# Patient Record
Sex: Female | Born: 1955 | Race: Black or African American | Hispanic: No | Marital: Married | Smoking: Never smoker
Health system: Southern US, Community
[De-identification: ages and names within clinical notes are randomized; demographics above are authoritative.]

## PROBLEM LIST (undated history)

## (undated) DIAGNOSIS — Z923 Personal history of irradiation: Secondary | ICD-10-CM

## (undated) DIAGNOSIS — N319 Neuromuscular dysfunction of bladder, unspecified: Secondary | ICD-10-CM

## (undated) DIAGNOSIS — G35 Multiple sclerosis: Secondary | ICD-10-CM

## (undated) DIAGNOSIS — N390 Urinary tract infection, site not specified: Secondary | ICD-10-CM

## (undated) DIAGNOSIS — G2581 Restless legs syndrome: Secondary | ICD-10-CM

## (undated) DIAGNOSIS — C50919 Malignant neoplasm of unspecified site of unspecified female breast: Secondary | ICD-10-CM

## (undated) DIAGNOSIS — H469 Unspecified optic neuritis: Secondary | ICD-10-CM

## (undated) HISTORY — PX: FOOT SURGERY: SHX648

## (undated) HISTORY — DX: Multiple sclerosis: G35

## (undated) HISTORY — DX: Neuromuscular dysfunction of bladder, unspecified: N31.9

## (undated) HISTORY — DX: Unspecified optic neuritis: H46.9

## (undated) HISTORY — DX: Personal history of irradiation: Z92.3

## (undated) HISTORY — PX: HAND SURGERY: SHX662

## (undated) HISTORY — DX: Urinary tract infection, site not specified: N39.0

## (undated) HISTORY — PX: BREAST LUMPECTOMY: SHX2

## (undated) HISTORY — DX: Malignant neoplasm of unspecified site of unspecified female breast: C50.919

## (undated) HISTORY — DX: Restless legs syndrome: G25.81

---

## 1998-07-15 ENCOUNTER — Ambulatory Visit: Admission: RE | Admit: 1998-07-15 | Discharge: 1998-07-15 | Payer: Self-pay | Admitting: Obstetrics and Gynecology

## 1998-09-30 ENCOUNTER — Other Ambulatory Visit: Admission: RE | Admit: 1998-09-30 | Discharge: 1998-09-30 | Payer: Self-pay | Admitting: Obstetrics and Gynecology

## 1999-08-11 ENCOUNTER — Ambulatory Visit (HOSPITAL_COMMUNITY): Admission: RE | Admit: 1999-08-11 | Discharge: 1999-08-11 | Payer: Self-pay | Admitting: Obstetrics and Gynecology

## 1999-08-11 ENCOUNTER — Encounter: Payer: Self-pay | Admitting: Obstetrics and Gynecology

## 1999-09-27 ENCOUNTER — Other Ambulatory Visit: Admission: RE | Admit: 1999-09-27 | Discharge: 1999-09-27 | Payer: Self-pay | Admitting: *Deleted

## 2000-04-26 ENCOUNTER — Other Ambulatory Visit: Admission: RE | Admit: 2000-04-26 | Discharge: 2000-04-26 | Payer: Self-pay | Admitting: *Deleted

## 2000-04-26 ENCOUNTER — Encounter (INDEPENDENT_AMBULATORY_CARE_PROVIDER_SITE_OTHER): Payer: Self-pay | Admitting: Specialist

## 2000-08-16 ENCOUNTER — Encounter: Payer: Self-pay | Admitting: *Deleted

## 2000-08-16 ENCOUNTER — Ambulatory Visit (HOSPITAL_COMMUNITY): Admission: RE | Admit: 2000-08-16 | Discharge: 2000-08-16 | Payer: Self-pay | Admitting: *Deleted

## 2000-10-11 ENCOUNTER — Other Ambulatory Visit: Admission: RE | Admit: 2000-10-11 | Discharge: 2000-10-11 | Payer: Self-pay | Admitting: *Deleted

## 2000-12-26 ENCOUNTER — Ambulatory Visit (HOSPITAL_COMMUNITY): Admission: RE | Admit: 2000-12-26 | Discharge: 2000-12-26 | Payer: Self-pay | Admitting: Urology

## 2001-05-23 ENCOUNTER — Encounter (INDEPENDENT_AMBULATORY_CARE_PROVIDER_SITE_OTHER): Payer: Self-pay | Admitting: Specialist

## 2001-05-23 ENCOUNTER — Other Ambulatory Visit: Admission: RE | Admit: 2001-05-23 | Discharge: 2001-05-23 | Payer: Self-pay | Admitting: *Deleted

## 2001-08-22 ENCOUNTER — Ambulatory Visit (HOSPITAL_COMMUNITY): Admission: RE | Admit: 2001-08-22 | Discharge: 2001-08-22 | Payer: Self-pay | Admitting: *Deleted

## 2001-08-22 ENCOUNTER — Encounter: Payer: Self-pay | Admitting: *Deleted

## 2001-11-25 ENCOUNTER — Other Ambulatory Visit: Admission: RE | Admit: 2001-11-25 | Discharge: 2001-11-25 | Payer: Self-pay | Admitting: *Deleted

## 2002-09-18 ENCOUNTER — Encounter: Payer: Self-pay | Admitting: *Deleted

## 2002-09-18 ENCOUNTER — Ambulatory Visit (HOSPITAL_COMMUNITY): Admission: RE | Admit: 2002-09-18 | Discharge: 2002-09-18 | Payer: Self-pay | Admitting: *Deleted

## 2002-10-22 ENCOUNTER — Other Ambulatory Visit: Admission: RE | Admit: 2002-10-22 | Discharge: 2002-10-22 | Payer: Self-pay | Admitting: *Deleted

## 2003-07-23 ENCOUNTER — Ambulatory Visit: Admission: RE | Admit: 2003-07-23 | Discharge: 2003-07-23 | Payer: Self-pay | Admitting: Gastroenterology

## 2003-10-20 ENCOUNTER — Ambulatory Visit (HOSPITAL_COMMUNITY): Admission: RE | Admit: 2003-10-20 | Discharge: 2003-10-20 | Payer: Self-pay | Admitting: *Deleted

## 2003-12-07 ENCOUNTER — Other Ambulatory Visit: Admission: RE | Admit: 2003-12-07 | Discharge: 2003-12-07 | Payer: Self-pay | Admitting: *Deleted

## 2004-11-24 ENCOUNTER — Ambulatory Visit (HOSPITAL_COMMUNITY): Admission: RE | Admit: 2004-11-24 | Discharge: 2004-11-24 | Payer: Self-pay | Admitting: *Deleted

## 2004-12-07 ENCOUNTER — Other Ambulatory Visit: Admission: RE | Admit: 2004-12-07 | Discharge: 2004-12-07 | Payer: Self-pay | Admitting: *Deleted

## 2005-11-30 ENCOUNTER — Ambulatory Visit (HOSPITAL_COMMUNITY): Admission: RE | Admit: 2005-11-30 | Discharge: 2005-11-30 | Payer: Self-pay | Admitting: *Deleted

## 2005-12-06 ENCOUNTER — Other Ambulatory Visit: Admission: RE | Admit: 2005-12-06 | Discharge: 2005-12-06 | Payer: Self-pay | Admitting: *Deleted

## 2006-09-30 ENCOUNTER — Ambulatory Visit (HOSPITAL_COMMUNITY): Admission: RE | Admit: 2006-09-30 | Discharge: 2006-09-30 | Payer: Self-pay | Admitting: Urology

## 2006-11-27 ENCOUNTER — Other Ambulatory Visit: Admission: RE | Admit: 2006-11-27 | Discharge: 2006-11-27 | Payer: Self-pay | Admitting: *Deleted

## 2006-12-02 ENCOUNTER — Ambulatory Visit (HOSPITAL_COMMUNITY): Admission: RE | Admit: 2006-12-02 | Discharge: 2006-12-02 | Payer: Self-pay | Admitting: *Deleted

## 2007-12-03 ENCOUNTER — Other Ambulatory Visit: Admission: RE | Admit: 2007-12-03 | Discharge: 2007-12-03 | Payer: Self-pay | Admitting: *Deleted

## 2007-12-09 ENCOUNTER — Ambulatory Visit (HOSPITAL_COMMUNITY): Admission: RE | Admit: 2007-12-09 | Discharge: 2007-12-09 | Payer: Self-pay | Admitting: *Deleted

## 2008-12-07 ENCOUNTER — Other Ambulatory Visit: Admission: RE | Admit: 2008-12-07 | Discharge: 2008-12-07 | Payer: Self-pay | Admitting: Gynecology

## 2008-12-28 ENCOUNTER — Ambulatory Visit (HOSPITAL_COMMUNITY): Admission: RE | Admit: 2008-12-28 | Discharge: 2008-12-28 | Payer: Self-pay | Admitting: Gynecology

## 2010-02-02 ENCOUNTER — Ambulatory Visit (HOSPITAL_COMMUNITY): Admission: RE | Admit: 2010-02-02 | Discharge: 2010-02-02 | Payer: Self-pay | Admitting: Gynecology

## 2010-12-30 ENCOUNTER — Encounter: Payer: Self-pay | Admitting: *Deleted

## 2011-01-22 ENCOUNTER — Other Ambulatory Visit: Payer: Self-pay | Admitting: Neurology

## 2011-01-22 ENCOUNTER — Ambulatory Visit
Admission: RE | Admit: 2011-01-22 | Discharge: 2011-01-22 | Disposition: A | Discharge status: Home / Self Care | Payer: 59 | Source: Ambulatory Visit | Attending: Neurology | Admitting: Neurology

## 2011-01-22 DIAGNOSIS — S31109A Unspecified open wound of abdominal wall, unspecified quadrant without penetration into peritoneal cavity, initial encounter: Secondary | ICD-10-CM

## 2011-04-27 NOTE — Op Note (Signed)
[facility]  Patient:    Joyce Bennett, Joyce Bennett                    MRN: [id number] Proc. Date: 12/26/00 Adm. Date:  [id number] Disc. Date: [id number] Attending:  Evlyn Clines                           Operative Report  PROCEDURE:  Cystoscopy, fulguration of urethral polyps and urethral dilation.  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  SURGEON:  Dr. Bjorn Pippin.  ANESTHESIA:  MAC and local.  COMPLICATIONS:  None.  INDICATIONS FOR PROCEDURE:  Joyce Bennett is a 55 year old black female with recurrent UTIs and irritative voiding symptoms who has been found on office cystoscopy to have multiple benign appearing proximal urethral polyps. It was felt that fulguration of the polyps and dilation of the urethra was indicated to reduce her symptoms.  FINDINGS AND PROCEDURE:  The patient was given p.o. Tequin. She was taken to the operating room where she was placed in lithotomy position. Sedation was given. She was then prepped with Betadine solution and draped in the usual sterile fashion. A 22 French cystoscope was passed. This was then fitted with a 12 degree lens. A transurethral needle was used and the proximal urethra was infiltrated with approximately 8 cc of 1% lidocaine. The right and left sides were injected. Once anesthetic had been induced, the Bovie electrode was used to fulgurate multiple urethral polyps at the bladder neck. No other bladder abnormalities were noted. After completion of the fulguration, she underwent urethral dilation to 78 [place], she was quite tight. There was a little bleeding after dilation. Her bladder was drained before removal of the final dilator. She was taken down from the lithotomy position and moved to the recovery room in stable condition. There were no complications during  the procedure. DD:  12/26/00 TD:  12/28/00 Job: [id number]

## 2011-04-27 NOTE — Op Note (Signed)
   NAME:  Joyce Bennett, Joyce Bennett                       ACCOUNT NO.:  [id number]   MEDICAL RECORD NO.:  [id number]                   PATIENT TYPE:  AMB   LOCATION:  [facility]FT[facility]                                 FACILITY:  [facility]   PHYSICIAN:  Anselmo Rod, M.D.               DATE OF BIRTH:  1956/10/27   DATE OF PROCEDURE:  07/23/2003  DATE OF DISCHARGE:                                 OPERATIVE REPORT   PROCEDURE:  Colonoscopy.   ENDOSCOPIST:  Anselmo Rod, M.D.   INSTRUMENT USED:  Pediatric adjustable Olympus colonoscope.   INDICATIONS FOR PROCEDURE:  A 55 year old [place] American female with a  history of  iron deficiency anemia undergoing colonoscopy and a question of  blood in the stool.   PREPROCEDURE PREPARATION:  Informed consent was obtained from the patient  and the patient was  fasted for 8 hours prior to  the procedure and prepped  with a bottle of magnesium citrate and a gallon of GoLYTELY the  night prior  to the procedure.   PREPROCEDURE PHYSICAL:  The patient had stable vital signs. Neck supple.  Chest clear to auscultation, normal S1, S2. Abdomen soft with normoactive  bowel sounds.   DESCRIPTION OF PROCEDURE:  The patient was placed in the left lateral  decubitus position and sedated with 50 mg of Demerol and 5 mg of Versed  intravenously. Once sedation was adequate, the patient was maintained on low  flow oxygen and continuous cardiac monitoring.   The Olympus video colonoscope was advanced into the rectum to the cecum and  terminal ileum without difficulty.  The appendiceal orifice and ileocecal  valve were clearly visualized and photographed. Small  internal hemorrhoids  were seen on retroflexion in the rectum. No other masses or polyps were  identified. The patient tolerated the procedure well without complications.   IMPRESSION:  1. Unrevealing colonoscopy except for small  internal hemorrhoids.  2. Normal appearing terminal ileum.  3. No masses, polyps or  diverticula noted.    RECOMMENDATIONS:  1. Outpatient follow up  in the next 2 weeks for repeat CBC.  2. Repeat colorectal cancer screening in the next 10 years unless the     patient develops any abnormal symptoms in the interim.                                               Anselmo Rod, M.D.    JNM/MEDQ  D:  07/23/2003  T:  07/24/2003  Job:  [id number]   cc:   Gabriel Earing, M.D.  [address]  [place]  [place] [address]  Fax: [phone]

## 2012-07-28 DIAGNOSIS — G35 Multiple sclerosis: Secondary | ICD-10-CM | POA: Insufficient documentation

## 2013-04-16 ENCOUNTER — Telehealth: Payer: Self-pay | Admitting: Neurology

## 2013-04-16 MED ORDER — INTERFERON BETA-1B 0.3 MG ~~LOC~~ KIT: 0.2500 mg | PACK | SUBCUTANEOUS | Status: DC

## 2013-04-16 NOTE — Telephone Encounter (Signed)
Former [facility] Patient assigned to Dr Willis.   

## 2013-06-08 ENCOUNTER — Telehealth: Payer: Self-pay | Admitting: Neurology

## 2013-06-08 MED ORDER — INTERFERON BETA-1B 0.3 MG ~~LOC~~ KIT: 0.2500 mg | PACK | SUBCUTANEOUS | Status: DC

## 2013-06-08 NOTE — Telephone Encounter (Signed)
Rx was already sent last month, I have resent it again today.

## 2013-07-15 ENCOUNTER — Ambulatory Visit: Payer: Self-pay | Admitting: Neurology

## 2013-07-16 ENCOUNTER — Encounter: Payer: Self-pay | Admitting: Neurology

## 2013-07-16 DIAGNOSIS — G35 Multiple sclerosis: Secondary | ICD-10-CM

## 2013-07-20 ENCOUNTER — Ambulatory Visit (INDEPENDENT_AMBULATORY_CARE_PROVIDER_SITE_OTHER): Payer: BC Managed Care – PPO | Admitting: Neurology

## 2013-07-20 ENCOUNTER — Encounter: Payer: Self-pay | Admitting: Neurology

## 2013-07-20 VITALS — BP 115/76 | HR 93 | Wt 160.0 lb

## 2013-07-20 DIAGNOSIS — G35 Multiple sclerosis: Secondary | ICD-10-CM

## 2013-07-20 MED ORDER — INTERFERON BETA-1B 0.3 MG ~~LOC~~ KIT: 0.2500 mg | PACK | SUBCUTANEOUS | Status: DC

## 2013-07-20 NOTE — Progress Notes (Signed)
   Reason for visit: Multiple sclerosis  Joyce Bennett is an 57 y.o. female  History of present illness:  Joyce Bennett is a 57 year old right-handed black female with a history of multiple sclerosis. The patient initially presented with a left optic neuritis in 1987. The patient eventually went on Betaseron in 1999. The patient has done relatively well on this medication, and she is tolerating the drug well. The patient indicates that she has a neurogenic bladder, and she has frequent urinary tract infections. The patient is on trimethoprim on a daily basis. The patient reports no new numbness, weakness, balance problems, visual disturbances, or bowel or bladder control issues since last seen. The patient indicates that her primary care doctor does her annual blood work. The patient has been doing well on Betaseron without side effects. The last MRI of the brain was done in 1999.  Past Medical History  Diagnosis Date  . Multiple sclerosis   . Urinary tract infection     Recurrent  . Optic neuritis, left   . Neurogenic bladder   . Restless leg syndrome     Past Surgical History  Procedure Laterality Date  . Hand surgery Right   . Foot surgery N/A     History reviewed. No pertinent family history.  Social history:  reports that she has never smoked. She has never used smokeless tobacco. She reports that she does not drink alcohol or use illicit drugs.  Allergies:  Allergies  Allergen Reactions  . Keflex (Cephalexin)   . Shellfish Allergy     Medications:  Current Outpatient Prescriptions on File Prior to Visit  Medication Sig Dispense Refill  . oxybutynin (DITROPAN XL) 15 MG 24 hr tablet Take 15 mg by mouth daily.       No current facility-administered medications on file prior to visit.    ROS:  Out of a complete 14 system review of symptoms, the patient complains only of the following symptoms, and all other reviewed systems are negative.  Urination  problems  Blood pressure 115/76, pulse 93, weight 160 lb (72.576 kg).  Physical Exam  General: The patient is alert and cooperative at the time of the examination.  Skin: No significant peripheral edema is noted.   Neurologic Exam  Cranial nerves: Facial symmetry is present. Speech is normal, no aphasia or dysarthria is noted. Extraocular movements are full. Visual fields are full. Pupils are equal, round, and reactive to light. Discs are flat bilaterally.  Motor: The patient has good strength in all 4 extremities.  Coordination: The patient has good finger-nose-finger and heel-to-shin bilaterally.  Gait and station: The patient has a normal gait. Tandem gait is slightly unsteady. Romberg is negative. No drift is seen.  Reflexes: Deep tendon reflexes are symmetric.   Assessment/Plan:  1. Multiple sclerosis  2. Neurogenic bladder  The patient is doing well on Betaseron. We will recheck MRI evaluation of the brain at this time. The patient will remain on Betaseron. The patient is to have blood work done through her primary care physician within the next week. The patient will followup in one year. A prescription was given for the Betaseron.  Marlan Palau MD 07/20/2013 6:58 PM  [facility] [address] [place], [place] [address]  Phone [phone] Fax [phone]

## 2013-07-23 ENCOUNTER — Telehealth: Payer: Self-pay | Admitting: Neurology

## 2013-07-24 NOTE — Telephone Encounter (Signed)
Called patient.  No answer.

## 2013-08-26 ENCOUNTER — Ambulatory Visit (INDEPENDENT_AMBULATORY_CARE_PROVIDER_SITE_OTHER): Payer: BC Managed Care – PPO

## 2013-08-26 DIAGNOSIS — G35 Multiple sclerosis: Secondary | ICD-10-CM

## 2013-08-27 MED ORDER — GADOPENTETATE DIMEGLUMINE 469.01 MG/ML IV SOLN: 17.0000 mL | Freq: Once | INTRAVENOUS | Status: AC | PRN

## 2013-12-18 ENCOUNTER — Telehealth: Payer: Self-pay | Admitting: Neurology

## 2014-01-26 NOTE — Telephone Encounter (Signed)
This patient had called in January but the phone note wasn't forwarded.  I left a message for patient that we received a prescription from [facility] for her Betaseron.  If she needed to change medication due to insurance to please call back and let us know.

## 2014-01-26 NOTE — Telephone Encounter (Signed)
Noted  

## 2014-01-27 ENCOUNTER — Telehealth: Payer: Self-pay | Admitting: Neurology

## 2014-01-27 NOTE — Telephone Encounter (Signed)
Noted.  I have faxed over her new Rx for Betaseron to [facility].

## 2014-01-27 NOTE — Telephone Encounter (Signed)
Patient calling to let Butch Penny know that she is not going to change her medication. She is going to leave it the way it is.

## 2014-02-19 ENCOUNTER — Other Ambulatory Visit (HOSPITAL_COMMUNITY): Payer: Self-pay | Admitting: Gynecology

## 2014-02-19 DIAGNOSIS — Z1231 Encounter for screening mammogram for malignant neoplasm of breast: Secondary | ICD-10-CM

## 2014-03-17 ENCOUNTER — Ambulatory Visit (HOSPITAL_COMMUNITY): Payer: 59

## 2014-07-26 ENCOUNTER — Ambulatory Visit (INDEPENDENT_AMBULATORY_CARE_PROVIDER_SITE_OTHER): Payer: BC Managed Care – PPO | Admitting: Adult Health

## 2014-07-26 ENCOUNTER — Encounter: Payer: Self-pay | Admitting: Adult Health

## 2014-07-26 VITALS — BP 117/71 | HR 83 | Ht 62.5 in | Wt 163.0 lb

## 2014-07-26 DIAGNOSIS — G35 Multiple sclerosis: Secondary | ICD-10-CM

## 2014-07-26 DIAGNOSIS — Z5181 Encounter for therapeutic drug level monitoring: Secondary | ICD-10-CM

## 2014-07-26 NOTE — Patient Instructions (Signed)

## 2014-07-26 NOTE — Progress Notes (Signed)
PATIENT: Joyce Bennett DOB: 1956/08/12  REASON FOR VISIT: follow up HISTORY FROM: patient  HISTORY OF PRESENT ILLNESS: Joyce Bennett is a 58 year old female with a history of multiple sclerosis. She returns today for follow-up. She is currently taking betaseron and is tolerating it well. Denies any Numbness and Weakness. She denies any balance issues or trouble with walking. Denies any changes with her vision. Denies any problems with her  Bowels or bladder. Overall the patient has been doing exceptionally well with no new neurological complaints.   HISTORY 07/20/13 (CW): history of multiple sclerosis. The patient initially presented with a left optic neuritis in 1987. The patient eventually went on Betaseron in 1999. The patient has done relatively well on this medication, and she is tolerating the drug well. The patient indicates that she has a neurogenic bladder, and she has frequent urinary tract infections. The patient is on trimethoprim on a daily basis. The patient reports no new numbness, weakness, balance problems, visual disturbances, or bowel or bladder control issues since last seen. The patient indicates that her primary care doctor does her annual blood work. The patient has been doing well on Betaseron without side effects. The last MRI of the brain was done in 1999.    REVIEW OF SYSTEMS: Full 14 system review of systems performed and notable only for:  Constitutional: N/A  Eyes: N/A Ear/Nose/Throat: N/A  Skin: N/A  Cardiovascular: N/A  Respiratory: N/A  Gastrointestinal: N/A  Genitourinary: N/A Hematology/Lymphatic: N/A  Endocrine: N/A Musculoskeletal:N/A  Allergy/Immunology: N/A  Neurological: N/A Psychiatric: N/A Sleep: N/A   ALLERGIES: Allergies  Allergen Reactions  . Keflex [Cephalexin]   . Shellfish Allergy     HOME MEDICATIONS: Outpatient Prescriptions Prior to Visit  Medication Sig Dispense Refill  . Interferon Beta-1b (BETASERON) 0.3 MG KIT  injection Inject 0.25 mg into the skin every other day.  15 each  6  . Multiple Vitamin (MULTIVITAMIN) tablet Take 1 tablet by mouth daily.      Marland Kitchen trimethoprim (TRIMPEX) 100 MG tablet Take 100 mg by mouth daily.       Marland Kitchen oxybutynin (DITROPAN XL) 15 MG 24 hr tablet Take 15 mg by mouth daily.       No facility-administered medications prior to visit.    PAST MEDICAL HISTORY: Past Medical History  Diagnosis Date  . Multiple sclerosis   . Urinary tract infection     Recurrent  . Optic neuritis, left   . Neurogenic bladder   . Restless leg syndrome     PAST SURGICAL HISTORY: Past Surgical History  Procedure Laterality Date  . Hand surgery Right   . Foot surgery N/A     FAMILY HISTORY: Family History  Problem Relation Age of Onset  . Heart disease Maternal Grandmother   . Cancer Paternal Grandmother   . Liver cancer Sister     SOCIAL HISTORY: History   Social History  . Marital Status: Married    Spouse Name: N/A    Number of Children: 1  . Years of Education: ASSOCIATE   Occupational History  . [employer] History Main Topics  . Smoking status: Never Smoker   . Smokeless tobacco: Never Used  . Alcohol Use: No  . Drug Use: No  . Sexual Activity: Not on file   Other Topics Concern  . Not on file   Social History Narrative   Patient is married with 1 child.   Patient is right  handed.   Patient has a Associate's degree.   Patient drinks 5 cups daily.      PHYSICAL EXAM  Filed Vitals:   07/26/14 1455  BP: 117/71  Pulse: 83  Height: 5' 2.5" (1.588 m)  Weight: 163 lb (73.936 kg)   Body mass index is 29.32 kg/(m^2).  Generalized: Well developed, in no acute distress   Neurological examination  Mentation: Alert oriented to time, place, history taking. Follows all commands speech and language fluent Cranial nerve II-XII: Pupils were equal round reactive to light. Extraocular movements were full, visual field were full on  confrontational test. Facial sensation and strength were normal. hearing was intact to finger rubbing bilaterally. Uvula tongue midline. Head turning and shoulder shrug  were normal and symmetric. Motor: The motor testing reveals 5 over 5 strength of all 4 extremities. Good symmetric motor tone is noted throughout.  Sensory: Sensory testing is intact to soft touch on all 4 extremities. No evidence of extinction is noted.  Coordination: Cerebellar testing reveals good finger-nose-finger and heel-to-shin bilaterally.  Gait and station: Gait is normal. Tandem gait is normal. Romberg is negative. No drift is seen.  Reflexes: Deep tendon reflexes are symmetric and normal bilaterally.    DIAGNOSTIC DATA (LABS, IMAGING, TESTING) - I reviewed patient records, labs, notes, testing and imaging myself where available.   ASSESSMENT AND PLAN 58 y.o. year old female  has a past medical history of Multiple sclerosis; Urinary tract infection; Optic neuritis, left; Neurogenic bladder; and Restless leg syndrome. here with:  1. Multiple sclerosis  Overall the patient has done exceptionally well on Betaseron. She has no new neurological complaints. She should continue taking Betaseron. I will check blood work today. Patient should followup in one year or sooner if needed   Ward Givens, MSN, NP-C 07/26/2014, 3:07 PM [facility] [address] [place], [place] [address] [phone]  Note: This document was prepared with digital dictation and possible smart phrase technology. Any transcriptional errors that result from this process are unintentional.

## 2014-07-27 ENCOUNTER — Telehealth: Payer: Self-pay | Admitting: Adult Health

## 2014-07-27 LAB — CBC WITH DIFFERENTIAL
BASOS ABS: 0 10*3/uL (ref 0.0–0.2)
Basos: 0 %
EOS ABS: 0.1 10*3/uL (ref 0.0–0.4)
Eos: 3 %
HCT: 33.9 % — ABNORMAL LOW (ref 34.0–46.6)
Hemoglobin: 10.8 g/dL — ABNORMAL LOW (ref 11.1–15.9)
IMMATURE GRANULOCYTES: 0 %
Immature Grans (Abs): 0 10*3/uL (ref 0.0–0.1)
LYMPHS ABS: 2.4 10*3/uL (ref 0.7–3.1)
LYMPHS: 47 %
MCH: 27.4 pg (ref 26.6–33.0)
MCHC: 31.9 g/dL (ref 31.5–35.7)
MCV: 86 fL (ref 79–97)
MONOCYTES: 11 %
Monocytes Absolute: 0.6 10*3/uL (ref 0.1–0.9)
Neutrophils Absolute: 2 10*3/uL (ref 1.4–7.0)
Neutrophils Relative %: 39 %
PLATELETS: 330 10*3/uL (ref 150–379)
RBC: 3.94 x10E6/uL (ref 3.77–5.28)
RDW: 13.9 % (ref 12.3–15.4)
WBC: 5.2 10*3/uL (ref 3.4–10.8)

## 2014-07-27 LAB — COMPREHENSIVE METABOLIC PANEL
ALBUMIN: 4.3 g/dL (ref 3.5–5.5)
ALK PHOS: 131 IU/L — AB (ref 39–117)
ALT: 11 IU/L (ref 0–32)
AST: 21 IU/L (ref 0–40)
Albumin/Globulin Ratio: 1.3 (ref 1.1–2.5)
BILIRUBIN TOTAL: 0.2 mg/dL (ref 0.0–1.2)
BUN / CREAT RATIO: 13 (ref 9–23)
BUN: 11 mg/dL (ref 6–24)
CHLORIDE: 101 mmol/L (ref 97–108)
CO2: 27 mmol/L (ref 18–29)
Calcium: 9.2 mg/dL (ref 8.7–10.2)
Creatinine, Ser: 0.83 mg/dL (ref 0.57–1.00)
GFR calc non Af Amer: 79 mL/min/{1.73_m2} (ref >59)
GFR, EST AFRICAN AMERICAN: 91 mL/min/{1.73_m2} (ref >59)
GLUCOSE: 74 mg/dL (ref 65–99)
Globulin, Total: 3.2 g/dL (ref 1.5–4.5)
POTASSIUM: 4.1 mmol/L (ref 3.5–5.2)
Sodium: 140 mmol/L (ref 134–144)
Total Protein: 7.5 g/dL (ref 6.0–8.5)

## 2014-07-27 NOTE — Telephone Encounter (Signed)
I called the patient. Her ALP value was elevated. This could be due to betaseron. We will recheck this lab in 2 months.

## 2014-07-27 NOTE — Telephone Encounter (Signed)
I called the patient regarding lab results but did not get an answer. I will try back later.

## 2014-09-28 ENCOUNTER — Telehealth: Payer: Self-pay | Admitting: Adult Health

## 2014-09-28 DIAGNOSIS — Z5181 Encounter for therapeutic drug level monitoring: Secondary | ICD-10-CM

## 2014-09-28 NOTE — Telephone Encounter (Signed)
I called the patient and left a voicemail in regards to her having lab work rechecked. We will recheck liver function- previous labwork showed an elevated ALP.

## 2014-11-01 ENCOUNTER — Telehealth: Payer: Self-pay | Admitting: Neurology

## 2014-11-01 NOTE — Telephone Encounter (Signed)
Spoke to patient and she relayed that she just had lab work at her PCP office and I requested that she have it faxed to [place].  She has a future order for hepatic function but this has not been completed yet, I relayed we will wait to get the current labs before we have her come in for more.  Also she wanted to know if she should be seen more than once a year, since Dr. Erling Cruz saw her every 3 months.

## 2014-11-01 NOTE — Telephone Encounter (Signed)
Patient is calling to find out if she should have lab work done. Per patient on her last visit she was to return in 1 year but previously she was seen every 3 months. Please call and advise. It is ok to leave a message. Thank you.

## 2014-11-01 NOTE — Telephone Encounter (Signed)
I called patient. The patient is been on Betaseron for several years. She has done well with it, I see no reason to check blood work more than once or twice a year on this medication. The patient could be seen once a year if she is stable. Betaseron is a safe medication.

## 2014-12-30 ENCOUNTER — Other Ambulatory Visit: Payer: Self-pay | Admitting: Neurology

## 2015-07-25 ENCOUNTER — Ambulatory Visit (INDEPENDENT_AMBULATORY_CARE_PROVIDER_SITE_OTHER): Payer: BC Managed Care – PPO | Admitting: Adult Health

## 2015-07-25 ENCOUNTER — Encounter: Payer: Self-pay | Admitting: Adult Health

## 2015-07-25 VITALS — BP 105/68 | HR 87 | Ht 62.0 in | Wt 162.0 lb

## 2015-07-25 DIAGNOSIS — Z5181 Encounter for therapeutic drug level monitoring: Secondary | ICD-10-CM | POA: Diagnosis not present

## 2015-07-25 DIAGNOSIS — G35 Multiple sclerosis: Secondary | ICD-10-CM | POA: Diagnosis not present

## 2015-07-25 NOTE — Progress Notes (Signed)
PATIENT: Joyce Bennett DOB: Aug 11, 1956  REASON FOR VISIT: follow up - Multiple sclerosis HISTORY FROM: patient  HISTORY OF PRESENT ILLNESS: Joyce Bennett  Is a 59 year old female with a history of multiple sclerosis. She returns today for follow-up. The patient continues to take Betaseron and tolerates it well.  She denies any changes with her bowels or bladder.She denies any new numbness or weakness. Denies any changes with her gait or balance. No changes with her vision. Overall she is doing well. She returns today for an evaluation.  HISTORY 07/26/14: Joyce Bennett is a 59 year old female with a history of multiple sclerosis. She returns today for follow-up. She is currently taking betaseron and is tolerating it well. Denies any Numbness and Weakness. She denies any balance issues or trouble with walking. Denies any changes with her vision. Denies any problems with her Bowels or bladder. Overall the patient has been doing exceptionally well with no new neurological complaints.   HISTORY 07/20/13 (CW): history of multiple sclerosis. The patient initially presented with a left optic neuritis in 1987. The patient eventually went on Betaseron in 1999. The patient has done relatively well on this medication, and she is tolerating the drug well. The patient indicates that she has a neurogenic bladder, and she has frequent urinary tract infections. The patient is on trimethoprim on a daily basis. The patient reports no new numbness, weakness, balance problems, visual disturbances, or bowel or bladder control issues since last seen. The patient indicates that her primary care doctor does her annual blood work. The patient has been doing well on Betaseron without side effects. The last MRI of the brain was done in 1999.  REVIEW OF SYSTEMS: Out of a complete 14 system review of symptoms, the patient complains only of the following symptoms, and all other reviewed systems are negative.   see history of  present illness  ALLERGIES: Allergies  Allergen Reactions  . Keflex [Cephalexin]   . Shellfish Allergy     HOME MEDICATIONS: Outpatient Prescriptions Prior to Visit  Medication Sig Dispense Refill  . BETASERON 0.3 MG KIT injection INJECT 0.25 MG UNDER THE SKIN EVERY OTHER DAY 3 kit 2  . cholecalciferol (VITAMIN D) 1000 UNITS tablet Take 1,000 Units by mouth daily.    . Multiple Vitamin (MULTIVITAMIN) tablet Take 1 tablet by mouth daily.    Marland Kitchen trimethoprim (TRIMPEX) 100 MG tablet Take 100 mg by mouth daily.      No facility-administered medications prior to visit.    PAST MEDICAL HISTORY: Past Medical History  Diagnosis Date  . Multiple sclerosis   . Urinary tract infection     Recurrent  . Optic neuritis, left   . Neurogenic bladder   . Restless leg syndrome     PAST SURGICAL HISTORY: Past Surgical History  Procedure Laterality Date  . Hand surgery Right   . Foot surgery N/A     FAMILY HISTORY: Family History  Problem Relation Age of Onset  . Heart disease Maternal Grandmother   . Cancer Paternal Grandmother   . Liver cancer Sister     SOCIAL HISTORY: Social History   Social History  . Marital Status: Married    Spouse Name: N/A  . Number of Children: 1  . Years of Education: ASSOCIATE   Occupational History  . [place] History Main Topics  . Smoking status: Never Smoker   . Smokeless tobacco: Never Used  . Alcohol Use: No  .  Drug Use: No  . Sexual Activity: Not on file   Other Topics Concern  . Not on file   Social History Narrative   Patient is married with 1 child.   Patient is right handed.   Patient has a Associate's degree.   Patient drinks 5 cups daily.      PHYSICAL EXAM  Filed Vitals:   07/25/15 1326  BP: 105/68  Pulse: 87  Height: 5' 2" (1.575 m)  Weight: 162 lb (73.483 kg)   Body mass index is 29.62 kg/(m^2).  Generalized: Well developed, in no acute distress   Neurological  examination  Mentation: Alert oriented to time, place, history taking. Follows all commands speech and language fluent Cranial nerve II-XII: Pupils were equal round reactive to light. Extraocular movements were full, visual field were full on confrontational test. Facial sensation and strength were normal. Uvula tongue midline. Head turning and shoulder shrug  were normal and symmetric. Motor: The motor testing reveals 5 over 5 strength of all 4 extremities. Good symmetric motor tone is noted throughout.  Sensory: Sensory testing is intact to soft touch on all 4 extremities. No evidence of extinction is noted.  Coordination: Cerebellar testing reveals good finger-nose-finger and heel-to-shin bilaterally.  Gait and station: Gait is normal. Tandem gait is normal. Romberg is negative. No drift is seen.  Reflexes: Deep tendon reflexes are symmetric and normal bilaterally.   DIAGNOSTIC DATA (LABS, IMAGING, TESTING) - I reviewed patient records, labs, notes, testing and imaging myself where available.   MRI brain with and without contrast  08/27/2013:   IMPRESSION:  Abnormal MRI brain (with and without) demonstrating: 1. Multiple supratentorial chronic demyelinating plaques. Moderate atrophy of corpus callosum and T1 black holes noted, indicating chronic disease. 2. No acute plaques. 3. Incidental right frontal developemental venous anomaly.   ASSESSMENT AND PLAN 59 y.o. year old female  has a past medical history of Multiple sclerosis; Urinary tract infection; Optic neuritis, left; Neurogenic bladder; and Restless leg syndrome. here with :   1. Multiple sclerosis   Overall the patient is doing well. She will continue on Betaseron. I will check blood work today. The patient's last MRI was in 2014. We will consider repeating MRI at the next visit.  Patient advised that if her symptoms worsen or she develops new symptoms she should let us know. She will follow-up in one year or sooner if  needed.     Ward Givens, MSN, NP-C 07/25/2015, 1:19 PM [facility] [address] [place], [place] [address] [phone]  Note: This document was prepared with digital dictation and possible smart phrase technology. Any transcriptional errors that result from this process are unintentional.

## 2015-07-25 NOTE — Progress Notes (Signed)
I have read the note, and I agree with the clinical assessment and plan.  Arrion Broaddus KEITH   

## 2015-07-25 NOTE — Patient Instructions (Addendum)
Continue Betaseron  I will check blood work today Repeat MRI at the next visit

## 2015-07-26 LAB — CBC WITH DIFFERENTIAL/PLATELET
BASOS ABS: 0 10*3/uL (ref 0.0–0.2)
Basos: 0 %
EOS (ABSOLUTE): 0.1 10*3/uL (ref 0.0–0.4)
Eos: 2 %
Hematocrit: 33 % — ABNORMAL LOW (ref 34.0–46.6)
Hemoglobin: 10.5 g/dL — ABNORMAL LOW (ref 11.1–15.9)
IMMATURE GRANS (ABS): 0 10*3/uL (ref 0.0–0.1)
IMMATURE GRANULOCYTES: 0 %
LYMPHS: 48 %
Lymphocytes Absolute: 2.2 10*3/uL (ref 0.7–3.1)
MCH: 27.3 pg (ref 26.6–33.0)
MCHC: 31.8 g/dL (ref 31.5–35.7)
MCV: 86 fL (ref 79–97)
Monocytes Absolute: 0.5 10*3/uL (ref 0.1–0.9)
Monocytes: 12 %
NEUTROS PCT: 38 %
Neutrophils Absolute: 1.7 10*3/uL (ref 1.4–7.0)
PLATELETS: 309 10*3/uL (ref 150–379)
RBC: 3.84 x10E6/uL (ref 3.77–5.28)
RDW: 13.9 % (ref 12.3–15.4)
WBC: 4.5 10*3/uL (ref 3.4–10.8)

## 2015-07-26 LAB — COMPREHENSIVE METABOLIC PANEL
A/G RATIO: 1.4 (ref 1.1–2.5)
ALT: 9 IU/L (ref 0–32)
AST: 16 IU/L (ref 0–40)
Albumin: 4.2 g/dL (ref 3.5–5.5)
Alkaline Phosphatase: 114 IU/L (ref 39–117)
BILIRUBIN TOTAL: 0.3 mg/dL (ref 0.0–1.2)
BUN/Creatinine Ratio: 20 (ref 9–23)
BUN: 15 mg/dL (ref 6–24)
CHLORIDE: 101 mmol/L (ref 97–108)
CO2: 25 mmol/L (ref 18–29)
Calcium: 9.1 mg/dL (ref 8.7–10.2)
Creatinine, Ser: 0.75 mg/dL (ref 0.57–1.00)
GFR calc Af Amer: 102 mL/min/{1.73_m2} (ref >59)
GFR calc non Af Amer: 88 mL/min/{1.73_m2} (ref >59)
GLUCOSE: 82 mg/dL (ref 65–99)
Globulin, Total: 3 g/dL (ref 1.5–4.5)
POTASSIUM: 4.3 mmol/L (ref 3.5–5.2)
Sodium: 142 mmol/L (ref 134–144)
Total Protein: 7.2 g/dL (ref 6.0–8.5)

## 2015-07-26 NOTE — Progress Notes (Signed)
Quick Note:  Called patient and left message to call me back about labs. ______

## 2015-07-27 ENCOUNTER — Telehealth: Payer: Self-pay

## 2015-07-27 NOTE — Telephone Encounter (Signed)
-----   Message from Ward Givens, NP sent at 07/26/2015  7:29 AM EDT ----- Lab work is ok. Please forward to PCP. Call patient. Thanks.

## 2015-07-27 NOTE — Telephone Encounter (Signed)
Patient called back relayed labs were ok and faxed to her PCP. Patient understood.

## 2015-07-27 NOTE — Progress Notes (Signed)
Quick Note:  Faxed labs to PCP. ______

## 2015-07-27 NOTE — Telephone Encounter (Signed)
Called patient and left her a messages x 3 . Faxed labs to PCP.

## 2015-09-20 ENCOUNTER — Other Ambulatory Visit: Payer: Self-pay | Admitting: Neurology

## 2015-12-23 ENCOUNTER — Encounter (HOSPITAL_COMMUNITY): Payer: Self-pay

## 2015-12-23 ENCOUNTER — Emergency Department (HOSPITAL_COMMUNITY)
Admission: EM | Admit: 2015-12-23 | Discharge: 2015-12-23 | Disposition: A | Discharge status: Home / Self Care | Payer: BC Managed Care – PPO | Attending: Emergency Medicine | Admitting: Emergency Medicine

## 2015-12-23 ENCOUNTER — Emergency Department (HOSPITAL_COMMUNITY): Payer: BC Managed Care – PPO

## 2015-12-23 DIAGNOSIS — Y92410 Unspecified street and highway as the place of occurrence of the external cause: Secondary | ICD-10-CM | POA: Diagnosis not present

## 2015-12-23 DIAGNOSIS — Z87448 Personal history of other diseases of urinary system: Secondary | ICD-10-CM | POA: Insufficient documentation

## 2015-12-23 DIAGNOSIS — Y9389 Activity, other specified: Secondary | ICD-10-CM | POA: Diagnosis not present

## 2015-12-23 DIAGNOSIS — G35 Multiple sclerosis: Secondary | ICD-10-CM | POA: Insufficient documentation

## 2015-12-23 DIAGNOSIS — S20219A Contusion of unspecified front wall of thorax, initial encounter: Secondary | ICD-10-CM

## 2015-12-23 DIAGNOSIS — Z79899 Other long term (current) drug therapy: Secondary | ICD-10-CM | POA: Insufficient documentation

## 2015-12-23 DIAGNOSIS — Y998 Other external cause status: Secondary | ICD-10-CM | POA: Insufficient documentation

## 2015-12-23 DIAGNOSIS — Z87440 Personal history of urinary (tract) infections: Secondary | ICD-10-CM | POA: Diagnosis not present

## 2015-12-23 DIAGNOSIS — Z792 Long term (current) use of antibiotics: Secondary | ICD-10-CM | POA: Diagnosis not present

## 2015-12-23 DIAGNOSIS — R0789 Other chest pain: Secondary | ICD-10-CM

## 2015-12-23 DIAGNOSIS — S29001A Unspecified injury of muscle and tendon of front wall of thorax, initial encounter: Secondary | ICD-10-CM | POA: Diagnosis present
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
## 2016-07-23 ENCOUNTER — Ambulatory Visit: Payer: BC Managed Care – PPO | Admitting: Neurology

## 2016-07-25 ENCOUNTER — Encounter: Payer: Self-pay | Admitting: Neurology

## 2016-07-25 ENCOUNTER — Ambulatory Visit (INDEPENDENT_AMBULATORY_CARE_PROVIDER_SITE_OTHER): Payer: BC Managed Care – PPO | Admitting: Neurology

## 2016-07-25 VITALS — BP 123/81 | HR 104 | Ht 63.0 in | Wt 163.5 lb

## 2016-07-25 DIAGNOSIS — G35 Multiple sclerosis: Secondary | ICD-10-CM

## 2016-07-25 NOTE — Progress Notes (Signed)
Reason for visit: Multiple sclerosis  Joyce Bennett is an 60 y.o. female  History of present illness:  Joyce Bennett is a 60 year old right-handed black female with a history of multiple sclerosis that has been well controlled with the use of Betaseron. The patient has been on Betaseron since 1999. She denies any new symptoms of numbness, weakness, vision changes, balance changes, or difficulty controlling the bowels or the bladder. The patient works as a Teacher, early years/pre. The patient tolerates the Betaseron fairly well. She indicates that she just had blood work done through her primary care physician within the last month. No other new medical issues have come up since last seen. She returns for an evaluation. The last MRI of the brain was done in 2014.  Past Medical History:  Diagnosis Date  . Multiple sclerosis ([place])   . Neurogenic bladder   . Optic neuritis, left   . Restless leg syndrome   . Urinary tract infection    Recurrent    Past Surgical History:  Procedure Laterality Date  . FOOT SURGERY N/A   . HAND SURGERY Right     Family History  Problem Relation Age of Onset  . Heart disease Maternal Grandmother   . Cancer Paternal Grandmother   . Liver cancer Sister     Social history:  reports that she has never smoked. She has never used smokeless tobacco. She reports that she does not drink alcohol or use drugs.    Allergies  Allergen Reactions  . Keflex [Cephalexin]   . Shellfish Allergy Swelling    Medications:  Prior to Admission medications   Medication Sig Start Date End Date Taking? Authorizing Provider  BETASERON 0.3 MG KIT injection INJECT 0.25 MG UNDER THE SKIN EVERY OTHER DAY 09/20/15   Kathrynn Ducking, MD  cholecalciferol (VITAMIN D) 1000 UNITS tablet Take 1,000 Units by mouth daily.    Historical Provider, MD  ESTRACE VAGINAL 0.1 MG/GM vaginal cream APPLY A PEA-SIZED AMOUNT TO VAGINAL OPENING EVERY MONDAY, WEDNESDAY, AND FRIDAY EVENING.  06/28/16   Historical Provider, MD  Multiple Vitamin (MULTIVITAMIN) tablet Take 1 tablet by mouth daily.    Historical Provider, MD  trimethoprim (TRIMPEX) 100 MG tablet TAKE 1 TABLET BEDTIME 07/01/15   Historical Provider, MD    ROS:  Out of a complete 14 system review of symptoms, the patient complains only of the following symptoms, and all other reviewed systems are negative.  Fatigue  Blood pressure 123/81, pulse (!) 104, height _0  (1.6 m), weight 163 lb 8 oz (74.2 kg).  Physical Exam  General: The patient is alert and cooperative at the time of the examination.  Skin: No significant peripheral edema is noted.   Neurologic Exam  Mental status: The patient is alert and oriented x 3 at the time of the examination. The patient has apparent normal recent and remote memory, with an apparently normal attention span and concentration ability.   Cranial nerves: Facial symmetry is present. Speech is normal, no aphasia or dysarthria is noted. Extraocular movements are full. Visual fields are full. Pupils are equal, round, and reactive to light. Discs are flat bilaterally.  Motor: The patient has good strength in all 4 extremities.  Sensory examination: Soft touch sensation is symmetric on the face, arms, and legs.  Coordination: The patient has good finger-nose-finger and heel-to-shin bilaterally.  Gait and station: The patient has a normal gait. Tandem gait is normal. Romberg is negative. No drift is seen.  Reflexes: Deep tendon reflexes are symmetric.   Assessment/Plan:  1. Multiple sclerosis  The patient is doing relatively well on the Betaseron. We will continue the medication for now. We will set her up for MRI of the brain is as a follow-up study for the multiple sclerosis. She will follow-up in one year, sooner if needed. She indicates that blood work was done within the last month through her primary care physician.  Jill Alexanders MD 07/25/2016 4:07 PM  [facility] [address] [place], [place] [address]  Phone [phone] Fax [phone]

## 2016-07-25 NOTE — Patient Instructions (Signed)
   We will check MRI of the brain for follow up.

## 2016-08-02 ENCOUNTER — Ambulatory Visit: Payer: BC Managed Care – PPO | Admitting: Neurology

## 2016-08-29 ENCOUNTER — Ambulatory Visit (INDEPENDENT_AMBULATORY_CARE_PROVIDER_SITE_OTHER): Payer: BC Managed Care – PPO

## 2016-08-29 DIAGNOSIS — G35 Multiple sclerosis: Secondary | ICD-10-CM

## 2016-08-30 MED ORDER — GADOPENTETATE DIMEGLUMINE 469.01 MG/ML IV SOLN: 15.0000 mL | Freq: Once | INTRAVENOUS | Status: AC | PRN

## 2016-08-31 ENCOUNTER — Telehealth: Payer: Self-pay | Admitting: Neurology

## 2016-08-31 NOTE — Telephone Encounter (Signed)
I called patient. MRI the brain shows no acute findings, the patient has a significant degree of paraventricular and cerebellar white matter changes. The patient clinically has done well on Betaseron, we will continue the medication.   MRI brain 08/30/16:  IMPRESSION:  Abnormal MRI brain (with and without) demonstrating: 1. Multiple round and ovoid periventricular, pericallosal, subcortical, juxtacortical and cerebellar chronic demyelinating plaques.  2. Moderate atrophy of corpus callosum. 3. No acute findings.

## 2016-10-25 ENCOUNTER — Other Ambulatory Visit: Payer: Self-pay | Admitting: Neurology

## 2016-10-26 ENCOUNTER — Telehealth: Payer: Self-pay | Admitting: Neurology

## 2016-10-26 NOTE — Telephone Encounter (Signed)
Patient called to request refill of BETASERON 0.3 MG KIT injection °

## 2016-10-26 NOTE — Telephone Encounter (Signed)
Rx refills sent to specialty pharmacy yesterday.

## 2016-11-08 ENCOUNTER — Telehealth: Payer: Self-pay

## 2016-11-08 NOTE — Telephone Encounter (Signed)
PA approved 11/07/16-11/07/2018 per [facility].

## 2017-07-17 ENCOUNTER — Telehealth: Payer: Self-pay | Admitting: *Deleted

## 2017-07-17 NOTE — Telephone Encounter (Signed)
Received fax from [place] that rx Betaseron shipped to pt on 07/16/17.

## 2017-07-23 ENCOUNTER — Ambulatory Visit (INDEPENDENT_AMBULATORY_CARE_PROVIDER_SITE_OTHER): Payer: BC Managed Care – PPO | Admitting: Adult Health

## 2017-07-23 ENCOUNTER — Encounter: Payer: Self-pay | Admitting: Adult Health

## 2017-07-23 VITALS — BP 104/72 | HR 89 | Ht 63.0 in | Wt 163.8 lb

## 2017-07-23 DIAGNOSIS — G35 Multiple sclerosis: Secondary | ICD-10-CM | POA: Diagnosis not present

## 2017-07-23 DIAGNOSIS — Z5181 Encounter for therapeutic drug level monitoring: Secondary | ICD-10-CM | POA: Diagnosis not present

## 2017-07-23 NOTE — Patient Instructions (Addendum)
Your Plan:  Continue Betaseron Blood work today If your symptoms worsen or you develop new symptoms please let us know.       Thank you for coming to see [place] at [facility]. I hope we have been able to provide you high quality care today.  You may receive a patient satisfaction survey over the next few weeks. We would appreciate your feedback and comments so that we may continue to improve ourselves and the health of our patients.

## 2017-07-23 NOTE — Progress Notes (Signed)
PATIENT: Joyce Bennett DOB: 1956/08/26  REASON FOR VISIT: follow up- multiple sclerosis HISTORY FROM: patient  HISTORY OF PRESENT ILLNESS: Today 07/23/17 Mrs. Joyce Bennett is a 61 year old female with a history of multiple sclerosis. She returns today for follow-up. She reports that she is doing well. She remains on Betaseron. She did have a repeat MRI of the brain that did not show any enhancing lesions. She denies any changes with her gait or balance. Denies any changes with her bowels or bladder. No new numbness or weakness. No change in vision. Overall she feels that she is doing well. She returns today for an evaluation.  HISTORY Ms. Joyce Bennett is a 61 year old right-handed black female with a history of multiple sclerosis that has been well controlled with the use of Betaseron. The patient has been on Betaseron since 1999. She denies any new symptoms of numbness, weakness, vision changes, balance changes, or difficulty controlling the bowels or the bladder. The patient works as a Teacher, early years/pre. The patient tolerates the Betaseron fairly well. She indicates that she just had blood work done through her primary care physician within the last month. No other new medical issues have come up since last seen. She returns for an evaluation. The last MRI of the brain was done in 2014.    REVIEW OF SYSTEMS: Out of a complete 14 system review of symptoms, the patient complains only of the following symptoms, and all other reviewed systems are negative.  see history of present illness  ALLERGIES: Allergies  Allergen Reactions  . Keflex [Cephalexin]   . Shellfish Allergy Swelling    HOME MEDICATIONS: Outpatient Medications Prior to Visit  Medication Sig Dispense Refill  . BETASERON 0.3 MG KIT injection INJECT 0.25 MG SUBCUTANEOUSLY EVERY OTHER DAY 42 kit 3  . cholecalciferol (VITAMIN D) 1000 UNITS tablet Take 1,000 Units by mouth daily.    Marland Kitchen ESTRACE VAGINAL 0.1 MG/GM vaginal cream  APPLY A PEA-SIZED AMOUNT TO VAGINAL OPENING EVERY MONDAY, WEDNESDAY, AND FRIDAY EVENING.  10  . Multiple Vitamin (MULTIVITAMIN) tablet Take 1 tablet by mouth daily.    Marland Kitchen trimethoprim (TRIMPEX) 100 MG tablet TAKE 1 TABLET BEDTIME  0   Facility-Administered Medications Prior to Visit  Medication Dose Route Frequency Provider Last Rate Last Dose  . gadopentetate dimeglumine (MAGNEVIST) injection 15 mL  15 mL Intravenous Once PRN Kathrynn Ducking, MD        PAST MEDICAL HISTORY: Past Medical History:  Diagnosis Date  . Multiple sclerosis ([place])   . Neurogenic bladder   . Optic neuritis, left   . Restless leg syndrome   . Urinary tract infection    Recurrent    PAST SURGICAL HISTORY: Past Surgical History:  Procedure Laterality Date  . FOOT SURGERY N/A   . HAND SURGERY Right     FAMILY HISTORY: Family History  Problem Relation Age of Onset  . Heart disease Maternal Grandmother   . Cancer Paternal Grandmother   . Liver cancer Sister     SOCIAL HISTORY: Social History   Social History  . Marital status: Married    Spouse name: N/A  . Number of children: 1  . Years of education: ASSOCIATE   Occupational History  . [employer] History Main Topics  . Smoking status: Never Smoker  . Smokeless tobacco: Never Used  . Alcohol use No  . Drug use: No  . Sexual activity: Not on file   Other Topics Concern  .  Not on file   Social History Narrative   Patient is married with 1 child.   Patient is right handed.   Patient has a Associate's degree.   Patient drinks 5 cups daily.      PHYSICAL EXAM  Vitals:   07/23/17 1522  BP: 104/72  Pulse: 89  Weight: 163 lb 12.8 oz (74.3 kg)  Height: _0  (1.6 m)   Body mass index is 29.02 kg/m.  Generalized: Well developed, in no acute distress   Neurological examination  Mentation: Alert oriented to time, place, history taking. Follows all commands speech and language fluent Cranial  nerve II-XII: Pupils were equal round reactive to light. Extraocular movements were full, visual field were full on confrontational test. Facial sensation and strength were normal. Uvula tongue midline. Head turning and shoulder shrug  were normal and symmetric. Motor: The motor testing reveals 5 over 5 strength of all 4 extremities. Good symmetric motor tone is noted throughout.  Sensory: Sensory testing is intact to soft touch on all 4 extremities. No evidence of extinction is noted.  Coordination: Cerebellar testing reveals good finger-nose-finger and heel-to-shin bilaterally.  Gait and station: Gait is normal. Tandem gait is normal. Romberg is negative. No drift is seen.  Reflexes: Deep tendon reflexes are symmetric and normal bilaterally.   DIAGNOSTIC DATA (LABS, IMAGING, TESTING) - I reviewed patient records, labs, notes, testing and imaging myself where available.  Lab Results  Component Value Date   WBC 4.5 07/25/2015   HGB 10.5 (L) 07/25/2015   HCT 33.0 (L) 07/25/2015   MCV 86 07/25/2015   PLT 309 07/25/2015      Component Value Date/Time   NA 142 07/25/2015 1401   K 4.3 07/25/2015 1401   CL 101 07/25/2015 1401   CO2 25 07/25/2015 1401   GLUCOSE 82 07/25/2015 1401   BUN 15 07/25/2015 1401   CREATININE 0.75 07/25/2015 1401   CALCIUM 9.1 07/25/2015 1401   PROT 7.2 07/25/2015 1401   ALBUMIN 4.2 07/25/2015 1401   AST 16 07/25/2015 1401   ALT 9 07/25/2015 1401   ALKPHOS 114 07/25/2015 1401   BILITOT 0.3 07/25/2015 1401   GFRNONAA 88 07/25/2015 1401   GFRAA 102 07/25/2015 1401      ASSESSMENT AND PLAN 61 y.o. year old female  has a past medical history of Multiple sclerosis ([place]); Neurogenic bladder; Optic neuritis, left; Restless leg syndrome; and Urinary tract infection. here with:  1. Multiple sclerosis  Overall the patient is doing well. She will continue on Betaseron. I will check blood work today She is advised that if her symptoms worsen or she develops new  symptoms she she'll let us know. She will follow-up in 6 months or sooner if needed.  I spent 15 minutes with the patient. 50% of this time was spent reviewing medication and symptoms.        Ward Givens, MSN, NP-C 07/23/2017, 3:56 PM [facility] [address], [place], [place] [address] [phone]

## 2017-07-23 NOTE — Progress Notes (Signed)
I have read the note, and I agree with the clinical assessment and plan.  Joyce Bennett,Joyce Bennett   

## 2017-07-24 ENCOUNTER — Encounter: Payer: Self-pay | Admitting: *Deleted

## 2017-07-24 ENCOUNTER — Telehealth: Payer: Self-pay | Admitting: *Deleted

## 2017-07-24 ENCOUNTER — Telehealth: Payer: Self-pay | Admitting: Adult Health

## 2017-07-24 LAB — CBC WITH DIFFERENTIAL/PLATELET
BASOS ABS: 0 10*3/uL (ref 0.0–0.2)
Basos: 0 %
EOS (ABSOLUTE): 0.1 10*3/uL (ref 0.0–0.4)
Eos: 3 %
Hematocrit: 35.1 % (ref 34.0–46.6)
Hemoglobin: 11.4 g/dL (ref 11.1–15.9)
IMMATURE GRANS (ABS): 0 10*3/uL (ref 0.0–0.1)
Immature Granulocytes: 0 %
LYMPHS ABS: 2.2 10*3/uL (ref 0.7–3.1)
LYMPHS: 39 %
MCH: 28.1 pg (ref 26.6–33.0)
MCHC: 32.5 g/dL (ref 31.5–35.7)
MCV: 87 fL (ref 79–97)
MONOS ABS: 0.7 10*3/uL (ref 0.1–0.9)
Monocytes: 12 %
NEUTROS ABS: 2.6 10*3/uL (ref 1.4–7.0)
Neutrophils: 46 %
PLATELETS: 366 10*3/uL (ref 150–379)
RBC: 4.06 x10E6/uL (ref 3.77–5.28)
RDW: 14 % (ref 12.3–15.4)
WBC: 5.6 10*3/uL (ref 3.4–10.8)

## 2017-07-24 LAB — COMPREHENSIVE METABOLIC PANEL
A/G RATIO: 1.3 (ref 1.2–2.2)
ALBUMIN: 4.3 g/dL (ref 3.6–4.8)
ALT: 13 IU/L (ref 0–32)
AST: 25 IU/L (ref 0–40)
Alkaline Phosphatase: 120 IU/L — ABNORMAL HIGH (ref 39–117)
BUN/Creatinine Ratio: 15 (ref 12–28)
BUN: 14 mg/dL (ref 8–27)
CALCIUM: 9.5 mg/dL (ref 8.7–10.3)
CHLORIDE: 101 mmol/L (ref 96–106)
CO2: 27 mmol/L (ref 20–29)
Creatinine, Ser: 0.94 mg/dL (ref 0.57–1.00)
GFR calc non Af Amer: 66 mL/min/{1.73_m2} (ref >59)
GFR, EST AFRICAN AMERICAN: 76 mL/min/{1.73_m2} (ref >59)
GLOBULIN, TOTAL: 3.2 g/dL (ref 1.5–4.5)
GLUCOSE: 73 mg/dL (ref 65–99)
POTASSIUM: 4.4 mmol/L (ref 3.5–5.2)
SODIUM: 139 mmol/L (ref 134–144)
TOTAL PROTEIN: 7.5 g/dL (ref 6.0–8.5)

## 2017-07-24 NOTE — Telephone Encounter (Signed)
I consulted MM/NP and ok to come in yearly.  (LMVM for pt that returned her call).  Also have lab results to give her as well.  I will try to call her later.

## 2017-07-24 NOTE — Telephone Encounter (Signed)
Spoke to pt and relayed that lab results relatively unremarkable.  Alk Phos (liver enzyme slightly elevated) will monitor.   Pt verbalized understanding.   Faxed to her pcp Tamieka Howell, MD as well.  

## 2017-07-24 NOTE — Telephone Encounter (Signed)
-----   Message from Ward Givens, NP sent at 07/24/2017  7:28 AM EDT ----- Blood work relatively unremarkable. Alkaline phosphatase slightly elevated. We will continue to monitor. Please call patient with results

## 2017-07-24 NOTE — Telephone Encounter (Signed)
See other note

## 2017-07-24 NOTE — Telephone Encounter (Signed)
Patient seen yesterday with Jinny Blossom and was scheduled for a 6 month fu.  She would like to know why because she normally does a yearly fu.  Please call patient.

## 2017-07-24 NOTE — Telephone Encounter (Signed)
Spoke to pt and ok for one year f/u.  Made to 07-24-18 at 1100.  Also gave her lab results.  Faxed results to her pcp Dr. Helane Rima fyi.

## 2017-07-24 NOTE — Telephone Encounter (Signed)
Patient called office returning RN's call.  Please call °

## 2017-07-29 ENCOUNTER — Ambulatory Visit: Payer: BC Managed Care – PPO | Admitting: Adult Health

## 2017-10-09 ENCOUNTER — Telehealth: Payer: Self-pay | Admitting: *Deleted

## 2017-10-09 NOTE — Telephone Encounter (Signed)
Received fax notification from [facility] that rx Betaseron SDV (14 per box) 0.3mg  [place] shipped to pt on 10/02/17.

## 2017-12-13 ENCOUNTER — Other Ambulatory Visit: Payer: Self-pay | Admitting: Neurology

## 2018-01-27 ENCOUNTER — Ambulatory Visit: Payer: BC Managed Care – PPO | Admitting: Adult Health

## 2018-02-07 ENCOUNTER — Emergency Department (HOSPITAL_COMMUNITY)
Admission: EM | Admit: 2018-02-07 | Discharge: 2018-02-08 | Disposition: A | Discharge status: Home / Self Care | Payer: BC Managed Care – PPO | Attending: Emergency Medicine | Admitting: Emergency Medicine

## 2018-02-07 ENCOUNTER — Encounter (HOSPITAL_COMMUNITY): Payer: Self-pay

## 2018-02-07 DIAGNOSIS — H9201 Otalgia, right ear: Secondary | ICD-10-CM | POA: Diagnosis present

## 2018-02-07 DIAGNOSIS — H65191 Other acute nonsuppurative otitis media, right ear: Secondary | ICD-10-CM | POA: Insufficient documentation

## 2018-02-07 DIAGNOSIS — Z79899 Other long term (current) drug therapy: Secondary | ICD-10-CM | POA: Insufficient documentation

## 2018-02-07 DIAGNOSIS — H65111 Acute and subacute allergic otitis media (mucoid) (sanguinous) (serous), right ear: Secondary | ICD-10-CM

## 2018-02-07 NOTE — ED Triage Notes (Signed)
Pt complains of right ear pain since today She states she's had a cough and congestion for two weeks She says that she feels dizzy when she lays down

## 2018-02-08 MED ORDER — AMOXICILLIN 500 MG PO CAPS: 500.0000 mg | ORAL_CAPSULE | Freq: Three times a day (TID) | ORAL | 0 refills | Status: DC

## 2018-02-08 MED ORDER — AMOXICILLIN 500 MG PO CAPS
500.0000 mg | ORAL_CAPSULE | Freq: Once | ORAL | Status: AC
  Administered 2018-02-08: 500 mg via ORAL
  Filled 2018-02-08: qty 1

## 2018-02-08 NOTE — ED Provider Notes (Signed)
[facility] DEPT Provider Note   CSN: [id number] Arrival date & time: 02/07/18  2044     History   Chief Complaint Chief Complaint  Patient presents with  . Otalgia    HPI Joyce Bennett is a 62 y.o. female.  Patient presents to the emergency department with a chief complaint of right-sided otalgia.  She reports having cough and URI symptoms times 1 week.  She states that she noticed ear pain today.  She denies any fevers or chills.  She states that she felt slightly dizzy when she laid down, but does not feel dizzy when standing or sitting.  She denies any numbness, weakness, or tingling.  She has not taken anything for the otalgia, but has taken Robitussin with codeine for her cough.  He denies any other associated symptoms.  She is not diabetic.  Take chronic prednisone for multiple sclerosis.   The history is provided by the patient. No language interpreter was used.    Past Medical History:  Diagnosis Date  . Multiple sclerosis ([place])   . Neurogenic bladder   . Optic neuritis, left   . Restless leg syndrome   . Urinary tract infection    Recurrent    Patient Active Problem List   Diagnosis Date Noted  . Multiple sclerosis ([place]) 07/28/2012    Past Surgical History:  Procedure Laterality Date  . FOOT SURGERY N/A   . HAND SURGERY Right     OB History    No data available       Home Medications    Prior to Admission medications   Medication Sig Start Date End Date Taking? Authorizing Provider  BETASERON 0.3 MG KIT injection ADD 1.2 ML DILUENT TO VIAL AND MIX GENTLY. INJECT 1 ML (0.25 MG) SUBCUTANEOUSLY EVERY OTHER DAY. USE WITHIN 3 HOURS OF MIXING. STORE AT ROOM 12/13/17   Ward Givens, NP  cholecalciferol (VITAMIN D) 1000 UNITS tablet Take 1,000 Units by mouth daily.    [provider]  ESTRACE VAGINAL 0.1 MG/GM vaginal cream APPLY A PEA-SIZED AMOUNT TO VAGINAL OPENING EVERY MONDAY, WEDNESDAY, AND FRIDAY EVENING.  06/28/16   [provider]  Multiple Vitamin (MULTIVITAMIN) tablet Take 1 tablet by mouth daily.    [provider]  trimethoprim (TRIMPEX) 100 MG tablet TAKE 1 TABLET BEDTIME 07/01/15   [provider]    Family History Family History  Problem Relation Age of Onset  . Heart disease Maternal Grandmother   . Cancer Paternal Grandmother   . Liver cancer Sister     Social History Social History   Tobacco Use  . Smoking status: Never Smoker  . Smokeless tobacco: Never Used  Substance Use Topics  . Alcohol use: No  . Drug use: No     Allergies   Keflex [cephalexin] and Shellfish allergy   Review of Systems Review of Systems  All other systems reviewed and are negative.    Physical Exam Updated Vital Signs BP (!) 150/93 (BP Location: Right Arm)   Pulse (!) 109   Temp 98 F (36.7 C) (Oral)   Resp 17   SpO2 99%   Physical Exam  Constitutional: She is oriented to person, place, and time. She appears well-developed and well-nourished.  HENT:  Head: Normocephalic and atraumatic.  Mucous behind right TM, mild erythema Oropharynx is clear  Eyes: Conjunctivae and EOM are normal. Pupils are equal, round, and reactive to light.  Neck: Normal range of motion. Neck supple.  Cardiovascular: Normal  rate and regular rhythm. Exam reveals no gallop and no friction rub.  No murmur heard. Pulmonary/Chest: Effort normal and breath sounds normal. No respiratory distress. She has no wheezes. She has no rales. She exhibits no tenderness.  Abdominal: Soft. Bowel sounds are normal. She exhibits no distension and no mass. There is no tenderness. There is no rebound and no guarding.  Musculoskeletal: Normal range of motion. She exhibits no edema or tenderness.  Neurological: She is alert and oriented to person, place, and time.  Skin: Skin is warm and dry.  Psychiatric: She has a normal mood and affect. Her behavior is normal. Judgment and thought content normal.    Nursing note and vitals reviewed.    ED Treatments / Results  Labs (all labs ordered are listed, but only abnormal results are displayed) Labs Reviewed - No data to display  EKG  EKG Interpretation None       Radiology No results found.  Procedures Procedures (including critical care time)  Medications Ordered in ED Medications  amoxicillin (AMOXIL) capsule 500 mg (not administered)     Initial Impression / Assessment and Plan / ED Course  I have reviewed the triage vital signs and the nursing notes.  Pertinent labs & imaging results that were available during my care of the patient were reviewed by me and considered in my medical decision making (see chart for details).     Patient with right-sided otalgia.  Physical exam is consistent with otitis media.  Will treat with amoxicillin.  No evidence of otitis externa, no mastoid tenderness, no lymphadenopathy.  Will give first dose of amoxicillin and discharged with the same.  Patient understands and agrees with the plan.  She is stable and ready for discharge.  Final Clinical Impressions(s) / ED Diagnoses   Final diagnoses:  Acute mucoid otitis media of right ear    ED Discharge Orders        Ordered    amoxicillin (AMOXIL) 500 MG capsule  3 times daily     02/08/18 0045       Montine Circle, PA-C 02/08/18 Darnelle Catalan, MD 02/08/18 [phone]

## 2018-02-12 ENCOUNTER — Other Ambulatory Visit: Payer: Self-pay

## 2018-02-12 ENCOUNTER — Emergency Department (HOSPITAL_COMMUNITY)
Admission: EM | Admit: 2018-02-12 | Discharge: 2018-02-12 | Disposition: A | Discharge status: Home / Self Care | Payer: BC Managed Care – PPO | Attending: Emergency Medicine | Admitting: Emergency Medicine

## 2018-02-12 ENCOUNTER — Telehealth: Payer: Self-pay | Admitting: Adult Health

## 2018-02-12 ENCOUNTER — Encounter (HOSPITAL_COMMUNITY): Payer: Self-pay | Admitting: Emergency Medicine

## 2018-02-12 DIAGNOSIS — R55 Syncope and collapse: Secondary | ICD-10-CM

## 2018-02-12 DIAGNOSIS — Z79899 Other long term (current) drug therapy: Secondary | ICD-10-CM | POA: Diagnosis not present

## 2018-02-12 DIAGNOSIS — I10 Essential (primary) hypertension: Secondary | ICD-10-CM

## 2018-02-12 LAB — BASIC METABOLIC PANEL
ANION GAP: 11 (ref 5–15)
BUN: 16 mg/dL (ref 6–20)
CALCIUM: 9.3 mg/dL (ref 8.9–10.3)
CHLORIDE: 103 mmol/L (ref 101–111)
CO2: 24 mmol/L (ref 22–32)
Creatinine, Ser: 0.64 mg/dL (ref 0.44–1.00)
GFR calc non Af Amer: >60 mL/min (ref >60)
GLUCOSE: 80 mg/dL (ref 65–99)
POTASSIUM: 3.9 mmol/L (ref 3.5–5.1)
Sodium: 138 mmol/L (ref 135–145)

## 2018-02-12 LAB — CBC WITH DIFFERENTIAL/PLATELET
BASOS PCT: 0 %
Basophils Absolute: 0 10*3/uL (ref 0.0–0.1)
Eosinophils Absolute: 0.2 10*3/uL (ref 0.0–0.7)
Eosinophils Relative: 3 %
HEMATOCRIT: 39 % (ref 36.0–46.0)
Hemoglobin: 12.3 g/dL (ref 12.0–15.0)
LYMPHS ABS: 1.6 10*3/uL (ref 0.7–4.0)
LYMPHS PCT: 27 %
MCH: 29 pg (ref 26.0–34.0)
MCHC: 31.5 g/dL (ref 30.0–36.0)
MCV: 92 fL (ref 78.0–100.0)
MONO ABS: 0.6 10*3/uL (ref 0.1–1.0)
MONOS PCT: 9 %
NEUTROS ABS: 3.6 10*3/uL (ref 1.7–7.7)
NEUTROS PCT: 61 %
Platelets: 388 10*3/uL (ref 150–400)
RBC: 4.24 MIL/uL (ref 3.87–5.11)
RDW: 13.7 % (ref 11.5–15.5)
WBC: 6 10*3/uL (ref 4.0–10.5)

## 2018-02-12 LAB — I-STAT TROPONIN, ED: Troponin i, poc: 0 ng/mL (ref 0.00–0.08)

## 2018-02-12 NOTE — ED Triage Notes (Signed)
Pt states her body felt flushed tonight and she could not get the feeling to go away so she called EMS and they came out and did her VS and an EKG and CBG  States everything was normal but her blood pressure was high and she does not normally have high blood pressure  Pt states she is currently on amoxicillin for an ear infection and has not been feeling well so she has not been eating or drinking like she should so she feels she may be dehydrated

## 2018-02-12 NOTE — Discharge Instructions (Signed)
Follow-up with your primary doctor in the next 3-4 days for a recheck, and return to the ER if symptoms significantly worsen or change in the meantime.

## 2018-02-12 NOTE — Telephone Encounter (Signed)
The rush feeling to be because her blood pressure was elevated.  Please check with patient to see if her blood pressure is now in normal range and if this sensation has resolved.

## 2018-02-12 NOTE — Telephone Encounter (Addendum)
Spoke with patient who stated she had an ear infection, wen to her doctor and  took antibiotics. She stated she felt better and  went to work. She stated when she came home she was doing extra work, had a rush and went to the ED. Her BP was high but came down in the ED. She stated she was told that everything checked out okay, and it was probably  over exertion.  She has a follow up with her PCP tomorrow. This RN asked if she has been checking her BP; she doesn't have a BP machine at home. Advised her PCP will check it again and make recommendations.  Advised she call back for any further questions or concerns. She verbalized understanding, appreciation of call.

## 2018-02-12 NOTE — ED Provider Notes (Signed)
[facility] DEPT Provider Note   CSN: [id number] Arrival date & time: 02/12/18  0025     History   Chief Complaint Chief Complaint  Patient presents with  . Hypertension    HPI Joyce Bennett is a 62 y.o. female.  Patient is a 62 year old female with past medical history of multiple sclerosis, neurogenic bladder, and restless legs.  She presents for evaluation of an episode of weakness that occurred at home.  She was with family members when she experienced what she describes as a "rush" throughout her body.  This lasted for several seconds and caused her to feel very weak.  She had to lay down on the floor.  Her family then called EMS and she was transported here for evaluation.  By the time she was seen, her symptoms have improved and she is now feeling better.  She denies any chest pain, difficulty breathing, or other complaints.   The history is provided by the patient.  Hypertension  This is a new problem. The current episode started 3 to 5 hours ago. The problem has been resolved. Pertinent negatives include no chest pain and no abdominal pain. Nothing aggravates the symptoms. Nothing relieves the symptoms. She has tried nothing for the symptoms.    Past Medical History:  Diagnosis Date  . Multiple sclerosis ([place])   . Neurogenic bladder   . Optic neuritis, left   . Restless leg syndrome   . Urinary tract infection    Recurrent    Patient Active Problem List   Diagnosis Date Noted  . Multiple sclerosis ([place]) 07/28/2012    Past Surgical History:  Procedure Laterality Date  . FOOT SURGERY N/A   . HAND SURGERY Right     OB History    No data available       Home Medications    Prior to Admission medications   Medication Sig Start Date End Date Taking? Authorizing Provider  amoxicillin (AMOXIL) 500 MG capsule Take 1 capsule (500 mg total) by mouth 3 (three) times daily. 02/08/18   Montine Circle, PA-C  BETASERON 0.3 MG KIT  injection ADD 1.2 ML DILUENT TO VIAL AND MIX GENTLY. INJECT 1 ML (0.25 MG) SUBCUTANEOUSLY EVERY OTHER DAY. USE WITHIN 3 HOURS OF MIXING. STORE AT ROOM 12/13/17   Ward Givens, NP  cholecalciferol (VITAMIN D) 1000 UNITS tablet Take 1,000 Units by mouth daily.    [provider]  ESTRACE VAGINAL 0.1 MG/GM vaginal cream APPLY A PEA-SIZED AMOUNT TO VAGINAL OPENING EVERY MONDAY, WEDNESDAY, AND FRIDAY EVENING. 06/28/16   [provider]  Multiple Vitamin (MULTIVITAMIN) tablet Take 1 tablet by mouth daily.    [provider]  trimethoprim (TRIMPEX) 100 MG tablet TAKE 1 TABLET BEDTIME 07/01/15   [provider]    Family History Family History  Problem Relation Age of Onset  . Heart disease Maternal Grandmother   . Cancer Paternal Grandmother   . Liver cancer Sister     Social History Social History   Tobacco Use  . Smoking status: Never Smoker  . Smokeless tobacco: Never Used  Substance Use Topics  . Alcohol use: Yes    Comment: rare  . Drug use: No     Allergies   Keflex [cephalexin] and Shellfish allergy   Review of Systems Review of Systems  Cardiovascular: Negative for chest pain.  Gastrointestinal: Negative for abdominal pain.  All other systems reviewed and are negative.    Physical Exam Updated Vital Signs BP Marland Kitchen)  160/104 (BP Location: Left Arm)   Pulse 88   Temp (!) 97.4 F (36.3 C) (Oral)   Resp 16   SpO2 98%   Physical Exam  Constitutional: She is oriented to person, place, and time. She appears well-developed and well-nourished. No distress.  HENT:  Head: Normocephalic and atraumatic.  Eyes: EOM are normal. Pupils are equal, round, and reactive to light.  Neck: Normal range of motion. Neck supple.  Cardiovascular: Normal rate and regular rhythm. Exam reveals no gallop and no friction rub.  No murmur heard. Pulmonary/Chest: Effort normal and breath sounds normal. No respiratory distress. She has no wheezes.  Abdominal:  Soft. Bowel sounds are normal. She exhibits no distension. There is no tenderness.  Musculoskeletal: Normal range of motion.  Neurological: She is alert and oriented to person, place, and time. No cranial nerve deficit. She exhibits normal muscle tone. Coordination normal.  Skin: Skin is warm and dry. She is not diaphoretic.  Nursing note and vitals reviewed.    ED Treatments / Results  Labs (all labs ordered are listed, but only abnormal results are displayed) Labs Reviewed  BASIC METABOLIC PANEL  CBC WITH DIFFERENTIAL/PLATELET  I-STAT TROPONIN, ED    EKG  EKG Interpretation  Date/Time:    Ventricular Rate:  82 PR Interval:    QRS Duration: 81 QT Interval:  380 QTC Calculation: 444 R Axis:   73 Text Interpretation:  Sinus rhythm Normal ECG Confirmed by Veryl Speak [phone]) on 02/12/2018 4:08:31 AM       Radiology No results found.  Procedures Procedures (including critical care time)  Medications Ordered in ED Medications - No data to display   Initial Impression / Assessment and Plan / ED Course  I have reviewed the triage vital signs and the nursing notes.  Pertinent labs & imaging results that were available during my care of the patient were reviewed by me and considered in my medical decision making (see chart for details).  Patient presents with complaints as outlined in the HPI.  She describes the sensation of a "rush" throughout her body that began suddenly.  She is unable to elaborate the sensation better than this.  This lasted for several minutes and has since resolved.  Her physical examination is unremarkable, vital signs are stable, and workup reveals no blood count, electrolytes, troponin, or EKG abnormalities.  At this point, I see no indication for further workup.  The family was concerned about her blood pressure as it was measured at 086 systolic.  I doubt this is the cause of her symptoms and do not feel as though this level of hypertension is in  need of emergent intervention.  I will have her watch her blood pressures and follow-up with her primary doctor.  Final Clinical Impressions(s) / ED Diagnoses   Final diagnoses:  None    ED Discharge Orders    None       Veryl Speak, MD 02/12/18 [phone]

## 2018-02-12 NOTE — Telephone Encounter (Signed)
Patient was seen in ED last night because she felt a rush throughout her body and had elevated BP. Would these symptoms have anything to do with her having MS.

## 2018-03-05 ENCOUNTER — Telehealth: Payer: Self-pay | Admitting: Adult Health

## 2018-03-05 NOTE — Telephone Encounter (Signed)
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

## 2018-03-06 NOTE — Telephone Encounter (Addendum)
Spoke with patient and advised her that this office wouldn't know what her employer requires. Advised she ask her employer and then call back if she needs to be seen sooner than her July 1 year FU which is already scheduled. Advised her that appointments fill quickly, however if she had to be seen, she could be worked in. She stated she would call back when she knew. She verbalized understanding, appreciation.

## 2018-04-01 ENCOUNTER — Telehealth: Payer: Self-pay | Admitting: Adult Health

## 2018-04-01 NOTE — Telephone Encounter (Signed)
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  Pt is asking if Dr Jannifer Franklin will just complete the paperwork and then just see her for an exam on scheduled date of 07-08.  Please call

## 2018-04-02 NOTE — Telephone Encounter (Signed)
Can you call her back to see what this paperwork entails?

## 2018-04-02 NOTE — Telephone Encounter (Addendum)
Called pt and discussed her DOT forms. She stated that her DOT physical is scheduled for 5/7 and the papers are due on 6/15. She would just need Dr. 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 She stated that Dr. Jannifer Franklin did this for her last year. Pt is aware that her next ov with Jinny Blossom is not until July. Pt stated she was told this can be done 4 months in advance but she was unclear on the details of the process. RN encouraged pt to ask DOT if there is a particular requirement on the timing of the signature based on physical assessment. Pt verbalized understanding. RN also suggested for pt to call office when she has her DOT physical. She verbalized understanding.

## 2018-06-16 ENCOUNTER — Ambulatory Visit: Payer: BC Managed Care – PPO | Admitting: Adult Health

## 2018-06-16 ENCOUNTER — Encounter: Payer: Self-pay | Admitting: Adult Health

## 2018-06-16 VITALS — BP 122/78 | HR 82 | Ht 63.0 in | Wt 165.0 lb

## 2018-06-16 DIAGNOSIS — G35 Multiple sclerosis: Secondary | ICD-10-CM

## 2018-06-16 NOTE — Patient Instructions (Addendum)
Your Plan:  Continue betaseron Blood work today MRI brain If your symptoms worsen or you develop new symptoms please let us know.   Thank you for coming to see [place] at [facility]. I hope we have been able to provide you high quality care today.  You may receive a patient satisfaction survey over the next few weeks. We would appreciate your feedback and comments so that we may continue to improve ourselves and the health of our patients.

## 2018-06-16 NOTE — Progress Notes (Signed)
PATIENT: Joyce Bennett DOB: 1956-10-26  REASON FOR VISIT: follow up HISTORY FROM: patient  HISTORY OF PRESENT ILLNESS: Today 06/16/18 Joyce Bennett is a 62 year old female with a history of multiple sclerosis.  She returns today for follow-up.  She remains on Betaseron and tolerates it well.  She denies any new numbness or weakness.  Denies any changes with her gait or balance.  Denies any changes with her vision.  No change in bowel or bladder.  No change in her mood or behavior.  She had blood work in March that was unremarkable.  Her previous MRI did not show any changes.  She does report that she does get tired of giving herself injections.  She returns today for an evaluation.   HISTORY 07/23/17 Joyce Bennett is a 62 year old female with a history of multiple sclerosis. She returns today for follow-up. She reports that she is doing well. She remains on Betaseron. She did have a repeat MRI of the brain that did not show any enhancing lesions. She denies any changes with her gait or balance. Denies any changes with her bowels or bladder. No new numbness or weakness. No change in vision. Overall she feels that she is doing well. She returns today for an evaluation.   REVIEW OF SYSTEMS: Out of a complete 14 system review of symptoms, the patient complains only of the following symptoms, and all other reviewed systems are negative. See HPI  ALLERGIES: Allergies  Allergen Reactions  . Keflex [Cephalexin]   . Shellfish Allergy Swelling    HOME MEDICATIONS: Outpatient Medications Prior to Visit  Medication Sig Dispense Refill  . BETASERON 0.3 MG KIT injection ADD 1.2 ML DILUENT TO VIAL AND MIX GENTLY. INJECT 1 ML (0.25 MG) SUBCUTANEOUSLY EVERY OTHER DAY. USE WITHIN 3 HOURS OF MIXING. STORE AT ROOM 42 kit 3  . cholecalciferol (VITAMIN D) 1000 UNITS tablet Take 1,000 Units by mouth daily.    Marland Kitchen ESTRACE VAGINAL 0.1 MG/GM vaginal cream APPLY A PEA-SIZED AMOUNT TO VAGINAL OPENING EVERY  MONDAY, WEDNESDAY, AND FRIDAY EVENING.  10  . Multiple Vitamin (MULTIVITAMIN) tablet Take 1 tablet by mouth daily.    Marland Kitchen trimethoprim (TRIMPEX) 100 MG tablet TAKE 1 TABLET BEDTIME  0  . amoxicillin (AMOXIL) 500 MG capsule Take 1 capsule (500 mg total) by mouth 3 (three) times daily. 21 capsule 0   Facility-Administered Medications Prior to Visit  Medication Dose Route Frequency Provider Last Rate Last Dose  . gadopentetate dimeglumine (MAGNEVIST) injection 15 mL  15 mL Intravenous Once PRN Kathrynn Ducking, MD        PAST MEDICAL HISTORY: Past Medical History:  Diagnosis Date  . Multiple sclerosis ([place])   . Neurogenic bladder   . Optic neuritis, left   . Restless leg syndrome   . Urinary tract infection    Recurrent    PAST SURGICAL HISTORY: Past Surgical History:  Procedure Laterality Date  . FOOT SURGERY N/A   . HAND SURGERY Right     FAMILY HISTORY: Family History  Problem Relation Age of Onset  . Heart disease Maternal Grandmother   . Cancer Paternal Grandmother   . Liver cancer Sister     SOCIAL HISTORY: Social History   Socioeconomic History  . Marital status: Married    Spouse name: Not on file  . Number of children: 1  . Years of education: ASSOCIATE  . Highest education level: Not on file  Occupational History  . Occupation: Recruitment consultant  Employer: [employer]  Social Needs  . Financial resource strain: Not on file  . Food insecurity:    Worry: Not on file    Inability: Not on file  . Transportation needs:    Medical: Not on file    Non-medical: Not on file  Tobacco Use  . Smoking status: Never Smoker  . Smokeless tobacco: Never Used  Substance and Sexual Activity  . Alcohol use: Yes    Comment: rare  . Drug use: No  . Sexual activity: Not on file  Lifestyle  . Physical activity:    Days per week: Not on file    Minutes per session: Not on file  . Stress: Not on file  Relationships  . Social connections:    Talks on phone:  Not on file    Gets together: Not on file    Attends religious service: Not on file    Active member of club or organization: Not on file    Attends meetings of clubs or organizations: Not on file    Relationship status: Not on file  . Intimate partner violence:    Fear of current or ex partner: Not on file    Emotionally abused: Not on file    Physically abused: Not on file    Forced sexual activity: Not on file  Other Topics Concern  . Not on file  Social History Narrative   Patient is married with 1 child.   Patient is right handed.   Patient has a Associate's degree.   Patient drinks 5 cups daily.      PHYSICAL EXAM  Vitals:   06/16/18 1411  BP: 122/78  Pulse: 82  Weight: 165 lb (74.8 kg)  Height: 5' 3" (1.6 m)   Body mass index is 29.23 kg/m.  Generalized: Well developed, in no acute distress   Neurological examination  Mentation: Alert oriented to time, place, history taking. Follows all commands speech and language fluent Cranial nerve II-XII: Pupils were equal round reactive to light. Extraocular movements were full, visual field were full on confrontational test. Facial sensation and strength were normal. Uvula tongue midline. Head turning and shoulder shrug  were normal and symmetric. Motor: The motor testing reveals 5 over 5 strength of all 4 extremities. Good symmetric motor tone is noted throughout.  Sensory: Sensory testing is intact to soft touch on all 4 extremities. No evidence of extinction is noted.  Coordination: Cerebellar testing reveals good finger-nose-finger and heel-to-shin bilaterally.  Gait and station: Gait is normal. Tandem gait is normal. Romberg is negative. No drift is seen.  Reflexes: Deep tendon reflexes are symmetric and normal bilaterally.   DIAGNOSTIC DATA (LABS, IMAGING, TESTING) - I reviewed patient records, labs, notes, testing and imaging myself where available.  Lab Results  Component Value Date   WBC 6.0 02/12/2018   HGB  12.3 02/12/2018   HCT 39.0 02/12/2018   MCV 92.0 02/12/2018   PLT 388 02/12/2018      Component Value Date/Time   NA 138 02/12/2018 0256   NA 139 07/23/2017 1625   K 3.9 02/12/2018 0256   CL 103 02/12/2018 0256   CO2 24 02/12/2018 0256   GLUCOSE 80 02/12/2018 0256   BUN 16 02/12/2018 0256   BUN 14 07/23/2017 1625   CREATININE 0.64 02/12/2018 0256   CALCIUM 9.3 02/12/2018 0256   PROT 7.5 07/23/2017 1625   ALBUMIN 4.3 07/23/2017 1625   AST 25 07/23/2017 1625   ALT 13 07/23/2017 1625  ALKPHOS 120 (H) 07/23/2017 1625   BILITOT <0.2 07/23/2017 1625   GFRNONAA >60 02/12/2018 0256   GFRAA >60 02/12/2018 0256   No results found for: CHOL, HDL, LDLCALC, LDLDIRECT, TRIG, CHOLHDL No results found for: HGBA1C No results found for: VITAMINB12 No results found for: TSH    ASSESSMENT AND PLAN 62 y.o. year old female  has a past medical history of Multiple sclerosis ([place]), Neurogenic bladder, Optic neuritis, left, Restless leg syndrome, and Urinary tract infection. here with :  1.  Multiple sclerosis  The patient is overall doing well.  She will continue on Betaseron.  She had blood work in March that was unremarkable.  We will consider repeating MRI at the next visit.  The patient did not want a repeat MRI at this time.  I have advised that if her symptoms worsen or she develops new symptoms she should let us know.  I suggested a 6 months follow-up but the patient would rather follow-up in a year and call if there is any new changes.  I am amenable to this plan.  I spent 15 minutes with the patient 50% of this time was spent discussing her plan of care.   Ward Givens, MSN, NP-C 06/16/2018, 2:21 PM [facility] [address], [place], [place] [address] [phone]

## 2018-06-16 NOTE — Progress Notes (Signed)
I have read the note, and I agree with the clinical assessment and plan.  Charles K Willis   

## 2018-07-24 ENCOUNTER — Ambulatory Visit: Payer: Self-pay | Admitting: Adult Health

## 2018-08-15 ENCOUNTER — Other Ambulatory Visit: Payer: Self-pay | Admitting: Adult Health

## 2018-10-22 ENCOUNTER — Telehealth: Payer: Self-pay | Admitting: Adult Health

## 2018-10-22 NOTE — Telephone Encounter (Signed)
Spoke with patient and inquired how she Is feeling. She stated she is fine. This RN advised her that the NP doesn't think it is related to MS, but certainly if she experiences symptoms again and they do not quickly resolve, she should go to the ED.  Patient verbalized understanding, appreciation.

## 2018-10-22 NOTE — Telephone Encounter (Signed)
Not sure that this is related to MS. Certainly if this occurs again and does not resolve quickly then she may need to go to the ED.

## 2018-10-22 NOTE — Telephone Encounter (Signed)
Spoke with patient for more details. She stated the event happened late this morning. It lasted a very short time, and she stated the headache went away. She feels fine at this time. She denies having any numbness of any part of her body, facial drooping, dizziness, confusion or vision changes. She stated she is still dealing with on and off ear infection, and still has fluid behind her ear, with "pressure that won't go away".  She is unsure if her symptoms were due to her ear issues. This RN reviewed stroke symptoms with her and advised if they occur she must call 911. Otherwise, this RN will discuss with NP and call her back later today. Patient verbalized understanding, appreciation.

## 2018-10-22 NOTE — Telephone Encounter (Signed)
Pt is asking for a call to discuss an event re: her speech.  Pt states she was speaking then felt her self have to slow down.  Pt states it lasted no time, afterwards she had a little headache.  Pt states other than an ear infection she has had since March that hasnt fully been resolved nothing else is going on.  Pt states she is fine now, just wanted to call.  Pt was asked if she needed to go to ED and she declined.  Pt all the same would like a call back

## 2018-10-31 ENCOUNTER — Telehealth: Payer: Self-pay | Admitting: *Deleted

## 2018-10-31 NOTE — Telephone Encounter (Signed)
Fax received from [place]. Betaseron approved for dates 10/30/18 thru 10/31/19.  PA# [place] [phone] A RK/fim

## 2019-02-23 ENCOUNTER — Telehealth: Payer: Self-pay | Admitting: Neurology

## 2019-02-23 ENCOUNTER — Encounter: Payer: Self-pay | Admitting: Neurology

## 2019-02-23 NOTE — Telephone Encounter (Signed)
I contacted the pt. She states she is in need of a letter stating she has MS and due to this illness she may miss days at work. I asked if the pt was referring to FMLA type paper work but she states she does not need FMLA just a letter from the MD stating she has a pre existing condition and will be out of work occasionally.   I advised I would fwd to MD to review.

## 2019-02-23 NOTE — Telephone Encounter (Signed)
Patient is calling in stating she needs a form stating she has a pre existing condition so that she can receive sick pay for the days she is out of work.

## 2019-02-23 NOTE — Telephone Encounter (Signed)
I will dictate a letter for the patient.  She has been on Betaseron for a number of years, she is very well controlled, she is followed annually through the office.

## 2019-02-24 ENCOUNTER — Encounter: Payer: Self-pay | Admitting: Neurology

## 2019-02-24 NOTE — Telephone Encounter (Signed)
Another letter was dictated.

## 2019-02-24 NOTE — Telephone Encounter (Signed)
Completed/signed letter placed up front for pick up at [facility].

## 2019-02-24 NOTE — Telephone Encounter (Signed)
Pt picked up the letter this am. She is wanting to know if the letter could state she has a neurological problem but not state she has MS. Pt states she does not want her supervisor to know she has MS. Please call to advise at [phone]

## 2019-02-25 NOTE — Telephone Encounter (Signed)
Pt advised letter has be completed. Letter placed upfront at [facility].

## 2019-04-14 ENCOUNTER — Telehealth: Payer: Self-pay

## 2019-04-14 NOTE — Telephone Encounter (Signed)
Received fax from [facility] stating Betaseron 0.3 mg was shipped of 04/08/2019.

## 2019-06-10 ENCOUNTER — Other Ambulatory Visit: Payer: Self-pay | Admitting: Neurology

## 2019-06-18 ENCOUNTER — Other Ambulatory Visit: Payer: Self-pay

## 2019-06-18 ENCOUNTER — Encounter: Payer: Self-pay | Admitting: Adult Health

## 2019-06-18 ENCOUNTER — Ambulatory Visit: Payer: BC Managed Care – PPO | Admitting: Adult Health

## 2019-06-18 VITALS — BP 113/76 | HR 93 | Temp 98.7°F | Ht 63.0 in | Wt 169.2 lb

## 2019-06-18 DIAGNOSIS — G35 Multiple sclerosis: Secondary | ICD-10-CM

## 2019-06-18 DIAGNOSIS — Z5181 Encounter for therapeutic drug level monitoring: Secondary | ICD-10-CM

## 2019-06-18 NOTE — Progress Notes (Signed)
PATIENT: Joyce Bennett DOB: [birth year]  REASON FOR VISIT: follow up HISTORY FROM: patient  HISTORY OF PRESENT ILLNESS: Today 06/18/19:  Joyce Bennett is a 63 year old female with a history of multiple sclerosis.  She returns today for follow-up.  She remains on Betaseron.  She denies any new symptoms.  No changes with her gait or balance.  No changes with her bowels or bladder.  No new numbness or weakness.  No changes with her vision.  Her last MRI of the brain was in 2017.  She joins me today for follow-up.  HISTORY 06/16/18 Joyce Bennett is a 63 year old female with a history of multiple sclerosis.  She returns today for follow-up.  She remains on Betaseron and tolerates it well.  She denies any new numbness or weakness.  Denies any changes with her gait or balance.  Denies any changes with her vision.  No change in bowel or bladder.  No change in her mood or behavior.  She had blood work in March that was unremarkable.  Her previous MRI did not show any changes.  She does report that she does get tired of giving herself injections.  She returns today for an evaluation.   REVIEW OF SYSTEMS: Out of a complete 14 system review of symptoms, the patient complains only of the following symptoms, and all other reviewed systems are negative.  See HPI  ALLERGIES: Allergies  Allergen Reactions  . Keflex [Cephalexin]   . Shellfish Allergy Swelling    HOME MEDICATIONS: Outpatient Medications Prior to Visit  Medication Sig Dispense Refill  . BETASERON 0.3 MG KIT injection ADD 1.2 ML DILUENT TO VIAL AND MIX GENTLY. INJECT 1 ML SUBCUTANEOUSLY EVERY OTHER DAY. USE WITHIN 3 HOURS OF MIXING. STORE AT ROOM TEMPERATURE. 42 kit 1  . cholecalciferol (VITAMIN D) 1000 UNITS tablet Take 1,000 Units by mouth daily.    Marland Kitchen ESTRACE VAGINAL 0.1 MG/GM vaginal cream APPLY A PEA-SIZED AMOUNT TO VAGINAL OPENING EVERY MONDAY, WEDNESDAY, AND FRIDAY EVENING.  10  . Multiple Vitamin (MULTIVITAMIN) tablet Take  1 tablet by mouth daily.    Marland Kitchen trimethoprim (TRIMPEX) 100 MG tablet TAKE 1 TABLET BEDTIME  0   Facility-Administered Medications Prior to Visit  Medication Dose Route Frequency Provider Last Rate Last Dose  . gadopentetate dimeglumine (MAGNEVIST) injection 15 mL  15 mL Intravenous Once PRN Kathrynn Ducking, MD        PAST MEDICAL HISTORY: Past Medical History:  Diagnosis Date  . Multiple sclerosis ([place])   . Neurogenic bladder   . Optic neuritis, left   . Restless leg syndrome   . Urinary tract infection    Recurrent    PAST SURGICAL HISTORY: Past Surgical History:  Procedure Laterality Date  . FOOT SURGERY N/A   . HAND SURGERY Right     FAMILY HISTORY: Family History  Problem Relation Age of Onset  . Heart disease Maternal Grandmother   . Cancer Paternal Grandmother   . Liver cancer Sister     SOCIAL HISTORY: Social History   Socioeconomic History  . Marital status: Married    Spouse name: Not on file  . Number of children: 1  . Years of education: ASSOCIATE  . Highest education level: Not on file  Occupational History  . Occupation: International aid/development worker: [employer]  . Financial resource strain: Not on file  . Food insecurity    Worry: Not on file    Inability: Not on  file  . Transportation needs    Medical: Not on file    Non-medical: Not on file  Tobacco Use  . Smoking status: Never Smoker  . Smokeless tobacco: Never Used  Substance and Sexual Activity  . Alcohol use: Yes    Comment: rare  . Drug use: No  . Sexual activity: Not on file  Lifestyle  . Physical activity    Days per week: Not on file    Minutes per session: Not on file  . Stress: Not on file  Relationships  . Social Herbalist on phone: Not on file    Gets together: Not on file    Attends religious service: Not on file    Active member of club or organization: Not on file    Attends meetings of clubs or organizations: Not on file     Relationship status: Not on file  . Intimate partner violence    Fear of current or ex partner: Not on file    Emotionally abused: Not on file    Physically abused: Not on file    Forced sexual activity: Not on file  Other Topics Concern  . Not on file  Social History Narrative   Patient is married with 1 child.   Patient is right handed.   Patient has a Associate's degree.   Patient drinks 5 cups daily.      PHYSICAL EXAM  Vitals:   06/18/19 1258  BP: 113/76  Pulse: 93  Temp: 98.7 F (37.1 C)  Weight: 169 lb 3.2 oz (76.7 kg)  Height: _0  (1.6 m)   Body mass index is 29.97 kg/m.  Generalized: Well developed, in no acute distress   Neurological examination  Mentation: Alert oriented to time, place, history taking. Follows all commands speech and language fluent Cranial nerve II-XII: Pupils were equal round reactive to light. Extraocular movements were full, visual field were full on confrontational test. Facial sensation and strength were normal. Uvula tongue midline. Head turning and shoulder shrug  were normal and symmetric. Motor: The motor testing reveals 5 over 5 strength of all 4 extremities. Good symmetric motor tone is noted throughout.  Sensory: Sensory testing is intact to soft touch on all 4 extremities. No evidence of extinction is noted.  Coordination: Cerebellar testing reveals good finger-nose-finger and heel-to-shin bilaterally.  Gait and station: Gait is normal. Reflexes: Deep tendon reflexes are symmetric and normal bilaterally.   DIAGNOSTIC DATA (LABS, IMAGING, TESTING) - I reviewed patient records, labs, notes, testing and imaging myself where available.  Lab Results  Component Value Date   WBC 6.0 02/12/2018   HGB 12.3 02/12/2018   HCT 39.0 02/12/2018   MCV 92.0 02/12/2018   PLT 388 02/12/2018      Component Value Date/Time   NA 138 02/12/2018 0256   NA 139 07/23/2017 1625   K 3.9 02/12/2018 0256   CL 103 02/12/2018 0256   CO2 24  02/12/2018 0256   GLUCOSE 80 02/12/2018 0256   BUN 16 02/12/2018 0256   BUN 14 07/23/2017 1625   CREATININE 0.64 02/12/2018 0256   CALCIUM 9.3 02/12/2018 0256   PROT 7.5 07/23/2017 1625   ALBUMIN 4.3 07/23/2017 1625   AST 25 07/23/2017 1625   ALT 13 07/23/2017 1625   ALKPHOS 120 (H) 07/23/2017 1625   BILITOT <0.2 07/23/2017 1625   GFRNONAA >60 02/12/2018 0256   GFRAA >60 02/12/2018 0256      ASSESSMENT AND PLAN 63 y.o. year  old female  has a past medical history of Multiple sclerosis ([place]), Neurogenic bladder, Optic neuritis, left, Restless leg syndrome, and Urinary tract infection. here with:  1.  Multiple sclerosis  Overall the patient is doing well.  She will continue on Betaseron.  I will check blood work today.  We will repeat an MRI of the brain to look for progression of MS.  I have advised that if her symptoms worsen or she develops new symptoms she should let us know.  She will follow-up in 6 months or sooner if needed.   I spent 15 minutes with the patient. 50% of this time was spent reviewing plan of care.   Ward Givens, MSN, NP-C 06/18/2019, 11:51 AM [facility] [address], [place], [place] [address] [phone]

## 2019-06-18 NOTE — Patient Instructions (Signed)
Your Plan:  Continue Betaseron  MRI brain  If your symptoms worsen or you develop new symptoms please let us know.   Thank you for coming to see us at [facility]. I hope we have been able to provide you high quality care today.  You may receive a patient satisfaction survey over the next few weeks. We would appreciate your feedback and comments so that we may continue to improve ourselves and the health of our patients.  

## 2019-06-19 LAB — COMPREHENSIVE METABOLIC PANEL
ALT: 11 IU/L (ref 0–32)
AST: 22 IU/L (ref 0–40)
Albumin/Globulin Ratio: 1.5 (ref 1.2–2.2)
Albumin: 4.3 g/dL (ref 3.8–4.8)
Alkaline Phosphatase: 127 IU/L — ABNORMAL HIGH (ref 39–117)
BUN/Creatinine Ratio: 15 (ref 12–28)
BUN: 13 mg/dL (ref 8–27)
Bilirubin Total: 0.2 mg/dL (ref 0.0–1.2)
CO2: 26 mmol/L (ref 20–29)
Calcium: 9.3 mg/dL (ref 8.7–10.3)
Chloride: 103 mmol/L (ref 96–106)
Creatinine, Ser: 0.87 mg/dL (ref 0.57–1.00)
GFR calc Af Amer: 83 mL/min/{1.73_m2} (ref >59)
GFR calc non Af Amer: 72 mL/min/{1.73_m2} (ref >59)
Globulin, Total: 2.8 g/dL (ref 1.5–4.5)
Glucose: 77 mg/dL (ref 65–99)
Potassium: 4.8 mmol/L (ref 3.5–5.2)
Sodium: 144 mmol/L (ref 134–144)
Total Protein: 7.1 g/dL (ref 6.0–8.5)

## 2019-06-19 LAB — CBC WITH DIFFERENTIAL/PLATELET
Basophils Absolute: 0 10*3/uL (ref 0.0–0.2)
Basos: 1 %
EOS (ABSOLUTE): 0.2 10*3/uL (ref 0.0–0.4)
Eos: 4 %
Hematocrit: 34.3 % (ref 34.0–46.6)
Hemoglobin: 11.1 g/dL (ref 11.1–15.9)
Immature Grans (Abs): 0 10*3/uL (ref 0.0–0.1)
Immature Granulocytes: 0 %
Lymphocytes Absolute: 1.8 10*3/uL (ref 0.7–3.1)
Lymphs: 36 %
MCH: 28.5 pg (ref 26.6–33.0)
MCHC: 32.4 g/dL (ref 31.5–35.7)
MCV: 88 fL (ref 79–97)
Monocytes Absolute: 0.8 10*3/uL (ref 0.1–0.9)
Monocytes: 15 %
Neutrophils Absolute: 2.2 10*3/uL (ref 1.4–7.0)
Neutrophils: 44 %
Platelets: 395 10*3/uL (ref 150–450)
RBC: 3.9 x10E6/uL (ref 3.77–5.28)
RDW: 12.8 % (ref 11.7–15.4)
WBC: 5 10*3/uL (ref 3.4–10.8)

## 2019-06-22 ENCOUNTER — Telehealth: Payer: Self-pay | Admitting: *Deleted

## 2019-06-22 NOTE — Telephone Encounter (Signed)
Spoke to pt and relayed that her lab results were unremarkable.  She verbalized understanding.

## 2019-06-22 NOTE — Telephone Encounter (Signed)
-----   Message from Ward Givens, NP sent at 06/22/2019  9:22 AM EDT ----- Labs results are unremarkable. Please call patient with results.

## 2019-06-23 ENCOUNTER — Telehealth: Payer: Self-pay | Admitting: Adult Health

## 2019-06-23 NOTE — Telephone Encounter (Signed)
LVM for pt to call back about scheduling mri  BCBS Auth: [id number] (exp. 06/22/19 to 12/18/19)

## 2019-06-23 NOTE — Telephone Encounter (Signed)
Patient returned my call due to the cost of the MRI she is going to hold off. I did offer the payment plan.

## 2019-07-23 ENCOUNTER — Telehealth: Payer: Self-pay | Admitting: Neurology

## 2019-07-23 NOTE — Telephone Encounter (Signed)
Pt is calling requesting a note to excuse her from working during [place] due to her condition

## 2019-07-27 NOTE — Telephone Encounter (Signed)
I will discuss with Dr. Jannifer Franklin when he is back in the office.

## 2019-07-27 NOTE — Telephone Encounter (Signed)
I called pt and she is asking for the same letter done back in 02-24-19 but with today's date.  She is currently working with one person, but if it comes along that will be working around a lot of people, she wants this letter to she may be excused.

## 2019-07-28 NOTE — Telephone Encounter (Signed)
Spoke to pt and made her aware of waiting for Dr. Jannifer Franklin to come back next week to discuss.  She verbalized understanding.

## 2019-08-04 NOTE — Telephone Encounter (Signed)
I called and LMVM for pt that she may receive shingles vaccine as long as it is not a live vaccine.  I repeated this message twice (not a live vaccine).  She is to call back if questions.

## 2019-08-04 NOTE — Telephone Encounter (Signed)
Yes as long as it isnt live vaccine

## 2019-08-04 NOTE — Telephone Encounter (Signed)
I called pt and relayed to her that per MM/NP who asked Dr. Felecia Shelling our MS specialist about needing letter for her work (she is taking Betaseron) that she is low risk.  She needs to wear mask, wash hands, social distance.  Pt verbalized understanding.  She asked then if can have shingle vacc.  (she has not had shingles).

## 2019-08-04 NOTE — Telephone Encounter (Signed)
I spoke to Dr. Jennye Moccasin.  He advised that with Betaseron there was a very low risk.  The patient should wear her mask when around others.  Try to practice social distancing.  I do not think a letter is warranted at this time.

## 2019-08-06 NOTE — Telephone Encounter (Signed)
Patient called today wanting to schedule her MRI. She is scheduled for 09/15/19 at [facility]. No to the covid questions.

## 2019-09-15 ENCOUNTER — Other Ambulatory Visit: Payer: BC Managed Care – PPO

## 2019-10-21 ENCOUNTER — Other Ambulatory Visit: Payer: Self-pay

## 2019-10-21 ENCOUNTER — Ambulatory Visit: Payer: BC Managed Care – PPO

## 2019-10-21 DIAGNOSIS — G35 Multiple sclerosis: Secondary | ICD-10-CM

## 2019-10-21 MED ORDER — GADOBENATE DIMEGLUMINE 529 MG/ML IV SOLN
15.0000 mL | Freq: Once | INTRAVENOUS | Status: AC | PRN
  Administered 2019-10-21: 15 mL via INTRAVENOUS

## 2019-10-27 ENCOUNTER — Telehealth: Payer: Self-pay

## 2019-10-27 NOTE — Telephone Encounter (Signed)
-----   Message from Ward Givens, NP sent at 10/26/2019 10:40 AM EST ----- No change from MRI in 2017

## 2019-10-27 NOTE — Telephone Encounter (Signed)
Spoke with the patient and she verbalized understanding her results. No questions or concerns at this time.   

## 2019-11-04 ENCOUNTER — Telehealth: Payer: Self-pay

## 2019-11-04 NOTE — Telephone Encounter (Signed)
PA for betaseron has been [facility]. Fax # form was sent to is [phone], confirmation received.

## 2019-12-08 NOTE — Telephone Encounter (Signed)
PA has been approved through [facility] for betaseron.  PA  Effective dates are 12/07/2019-12/06/2020.

## 2020-02-02 ENCOUNTER — Telehealth: Payer: Self-pay | Admitting: Adult Health

## 2020-02-02 NOTE — Telephone Encounter (Signed)
LVM informing patient that NP stated she may take Covid vaccine. She needs to discuss with her PCP and continue to follow CDC guidelines. Left # for questions.

## 2020-02-02 NOTE — Telephone Encounter (Signed)
Pt called wanting to know if she would be ok to receive the [place] Vaccination. Please advise.

## 2020-02-16 ENCOUNTER — Other Ambulatory Visit: Payer: Self-pay | Admitting: Neurology

## 2020-06-22 ENCOUNTER — Ambulatory Visit: Payer: BC Managed Care – PPO | Admitting: Adult Health

## 2020-06-22 ENCOUNTER — Other Ambulatory Visit: Payer: Self-pay

## 2020-06-22 ENCOUNTER — Encounter: Payer: Self-pay | Admitting: Adult Health

## 2020-06-22 VITALS — BP 124/80 | HR 65 | Ht 63.0 in | Wt 169.2 lb

## 2020-06-22 DIAGNOSIS — Z5181 Encounter for therapeutic drug level monitoring: Secondary | ICD-10-CM

## 2020-06-22 DIAGNOSIS — G35 Multiple sclerosis: Secondary | ICD-10-CM

## 2020-06-22 NOTE — Progress Notes (Signed)
PATIENT: Joyce Bennett DOB: 11-Aug-1956  REASON FOR VISIT: follow up HISTORY FROM: patient  HISTORY OF PRESENT ILLNESS: Today 06/22/20:  Joyce Bennett is a 64 year old female with a history of multiple sclerosis.  Joyce Bennett returns today for follow-up.  Overall Joyce Bennett feels that Joyce Bennett has been doing well.  Joyce Bennett denies any new symptoms.  No new numbness or tingling.  No changes with her gait or balance.  No changes with the bowels or bladder.  No visual changes.  Joyce Bennett remains on Betaseron.  Joyce Bennett returns today for an evaluation.  HISTORY 06/18/19:  Joyce Bennett is a 64 year old female with a history of multiple sclerosis.  Joyce Bennett returns today for follow-up.  Joyce Bennett remains on Betaseron.  Joyce Bennett denies any new symptoms.  No changes with her gait or balance.  No changes with her bowels or bladder.  No new numbness or weakness.  No changes with her vision.  Her last MRI of the brain was in 2017.  Joyce Bennett joins me today for follow-up.  REVIEW OF SYSTEMS: Out of a complete 14 system review of symptoms, the patient complains only of the following symptoms, and all other reviewed systems are negative.  See HPI  ALLERGIES: Allergies  Allergen Reactions  . Keflex [Cephalexin]   . Shellfish Allergy Swelling    HOME MEDICATIONS: Outpatient Medications Prior to Visit  Medication Sig Dispense Refill  . BETASERON 0.3 MG KIT injection ADD 1.2 ML DILUENT TO VIAL AND MIX GENTLY. INJECT 1 ML SUBCUTANEOUSLY EVERY OTHER DAY. USE WITHIN 3 HOURS OF MIXING. STORE AT ROOM TEMPERATURE. 42 kit 6  . cholecalciferol (VITAMIN D) 1000 UNITS tablet Take 1,000 Units by mouth daily.    . diphenhydrAMINE HCl (BENADRYL ALLERGY PO) Take by mouth 3 times/day as needed-between meals & bedtime. As needed    . ESTRACE VAGINAL 0.1 MG/GM vaginal cream APPLY A PEA-SIZED AMOUNT TO VAGINAL OPENING EVERY MONDAY, WEDNESDAY, AND FRIDAY EVENING.  10  . fluticasone (FLONASE) 50 MCG/ACT nasal spray ONE SPRAY BY BOTH NOSTRILS ROUTE DAILY.    .  Multiple Vitamin (MULTIVITAMIN) tablet Take 1 tablet by mouth daily.    Marland Kitchen trimethoprim (TRIMPEX) 100 MG tablet TAKE 1 TABLET BEDTIME  0   Facility-Administered Medications Prior to Visit  Medication Dose Route Frequency Provider Last Rate Last Admin  . gadopentetate dimeglumine (MAGNEVIST) injection 15 mL  15 mL Intravenous Once PRN Kathrynn Ducking, MD        PAST MEDICAL HISTORY: Past Medical History:  Diagnosis Date  . Multiple sclerosis ([place])   . Neurogenic bladder   . Optic neuritis, left   . Restless leg syndrome   . Urinary tract infection    Recurrent    PAST SURGICAL HISTORY: Past Surgical History:  Procedure Laterality Date  . FOOT SURGERY N/A   . HAND SURGERY Right     FAMILY HISTORY: Family History  Problem Relation Age of Onset  . Heart disease Maternal Grandmother   . Cancer Paternal Grandmother   . Liver cancer Sister     SOCIAL HISTORY: Social History   Socioeconomic History  . Marital status: Married    Spouse name: Not on file  . Number of children: 1  . Years of education: ASSOCIATE  . Highest education level: Not on file  Occupational History  . Occupation: International aid/development worker: [employer]  Tobacco Use  . Smoking status: Never Smoker  . Smokeless tobacco: Never Used  Substance and Sexual Activity  . Alcohol  use: Yes    Comment: rare  . Drug use: No  . Sexual activity: Not on file  Other Topics Concern  . Not on file  Social History Narrative   Patient is married with 1 child.   Patient is right handed.   Patient has a Associate's degree.   Patient drinks 5 cups daily.   Social Determinants of Health   Financial Resource Strain:   . Difficulty of Paying Living Expenses:   Food Insecurity:   . Worried About Charity fundraiser in the Last Year:   . Arboriculturist in the Last Year:   Transportation Needs:   . Film/video editor (Medical):   Marland Kitchen Lack of Transportation (Non-Medical):   Physical Activity:   .  Days of Exercise per Week:   . Minutes of Exercise per Session:   Stress:   . Feeling of Stress :   Social Connections:   . Frequency of Communication with Friends and Family:   . Frequency of Social Gatherings with Friends and Family:   . Attends Religious Services:   . Active Member of Clubs or Organizations:   . Attends Archivist Meetings:   Marland Kitchen Marital Status:   Intimate Partner Violence:   . Fear of Current or Ex-Partner:   . Emotionally Abused:   Marland Kitchen Physically Abused:   . Sexually Abused:       PHYSICAL EXAM  There were no vitals filed for this visit. There is no height or weight on file to calculate BMI.  Generalized: Well developed, in no acute distress   Neurological examination  Mentation: Alert oriented to time, place, history taking. Follows all commands speech and language fluent Cranial nerve II-XII: Pupils were equal round reactive to light. Extraocular movements were full, visual field were full on confrontational test. Facial sensation and strength were normal. Uvula tongue midline. Head turning and shoulder shrug  were normal and symmetric. Motor: The motor testing reveals 5 over 5 strength of all 4 extremities. Good symmetric motor tone is noted throughout.  Sensory: Sensory testing is intact to soft touch on all 4 extremities. No evidence of extinction is noted.  Coordination: Cerebellar testing reveals good finger-nose-finger and heel-to-shin bilaterally.  Gait and station: Gait is normal.  Reflexes: Deep tendon reflexes are symmetric and normal bilaterally.   DIAGNOSTIC DATA (LABS, IMAGING, TESTING) - I reviewed patient records, labs, notes, testing and imaging myself where available.  Lab Results  Component Value Date   WBC 5.0 06/18/2019   HGB 11.1 06/18/2019   HCT 34.3 06/18/2019   MCV 88 06/18/2019   PLT 395 06/18/2019      Component Value Date/Time   NA 144 06/18/2019 1340   K 4.8 06/18/2019 1340   CL 103 06/18/2019 1340   CO2 26  06/18/2019 1340   GLUCOSE 77 06/18/2019 1340   GLUCOSE 80 02/12/2018 0256   BUN 13 06/18/2019 1340   CREATININE 0.87 06/18/2019 1340   CALCIUM 9.3 06/18/2019 1340   PROT 7.1 06/18/2019 1340   ALBUMIN 4.3 06/18/2019 1340   AST 22 06/18/2019 1340   ALT 11 06/18/2019 1340   ALKPHOS 127 (H) 06/18/2019 1340   BILITOT 0.2 06/18/2019 1340   GFRNONAA 72 06/18/2019 1340   GFRAA 83 06/18/2019 1340      ASSESSMENT AND PLAN 64 y.o. year old female  has a past medical history of Multiple sclerosis ([place]), Neurogenic bladder, Optic neuritis, left, Restless leg syndrome, and Urinary tract infection. here with:  Multiple sclerosis   Stable  Continue Betaseron   Last MRI of the brain completed in 2020 was stable  Advised if symptoms worsen or Joyce Bennett develops new symptoms Joyce Bennett should let us know  Follow-up in 1 year or sooner if needed   I spent 20 minutes of face-to-face and non-face-to-face time with patient.  This included previsit chart review, lab review, study review, order entry, electronic health record documentation, patient education.  Ward Givens, MSN, NP-C 06/22/2020, 12:51 PM [facility] [address] [place], [place] [address] [phone]

## 2020-06-22 NOTE — Patient Instructions (Addendum)
Your Plan:  Continue  Betaseron Blood work today If symptoms worsen or you develop new symptoms please let us know  Thank you for coming to see [place] at [facility]. I hope we have been able to provide you high quality care today.  You may receive a patient satisfaction survey over the next few weeks. We would appreciate your feedback and comments so that we may continue to improve ourselves and the health of our patients.

## 2020-06-23 LAB — COMPREHENSIVE METABOLIC PANEL
ALT: 8 IU/L (ref 0–32)
AST: 15 IU/L (ref 0–40)
Albumin/Globulin Ratio: 1.5 (ref 1.2–2.2)
Albumin: 4.1 g/dL (ref 3.8–4.8)
Alkaline Phosphatase: 143 IU/L — ABNORMAL HIGH (ref 48–121)
BUN/Creatinine Ratio: 23 (ref 12–28)
BUN: 18 mg/dL (ref 8–27)
Bilirubin Total: <0.2 mg/dL (ref 0.0–1.2)
CO2: 26 mmol/L (ref 20–29)
Calcium: 9.4 mg/dL (ref 8.7–10.3)
Chloride: 105 mmol/L (ref 96–106)
Creatinine, Ser: 0.77 mg/dL (ref 0.57–1.00)
GFR calc Af Amer: 96 mL/min/{1.73_m2} (ref >59)
GFR calc non Af Amer: 83 mL/min/{1.73_m2} (ref >59)
Globulin, Total: 2.7 g/dL (ref 1.5–4.5)
Glucose: 73 mg/dL (ref 65–99)
Potassium: 4.9 mmol/L (ref 3.5–5.2)
Sodium: 142 mmol/L (ref 134–144)
Total Protein: 6.8 g/dL (ref 6.0–8.5)

## 2020-06-23 LAB — CBC WITH DIFFERENTIAL/PLATELET
Basophils Absolute: 0 10*3/uL (ref 0.0–0.2)
Basos: 1 %
EOS (ABSOLUTE): 0.1 10*3/uL (ref 0.0–0.4)
Eos: 3 %
Hematocrit: 35 % (ref 34.0–46.6)
Hemoglobin: 11 g/dL — ABNORMAL LOW (ref 11.1–15.9)
Immature Grans (Abs): 0 10*3/uL (ref 0.0–0.1)
Immature Granulocytes: 0 %
Lymphocytes Absolute: 2.2 10*3/uL (ref 0.7–3.1)
Lymphs: 42 %
MCH: 28.4 pg (ref 26.6–33.0)
MCHC: 31.4 g/dL — ABNORMAL LOW (ref 31.5–35.7)
MCV: 90 fL (ref 79–97)
Monocytes Absolute: 0.8 10*3/uL (ref 0.1–0.9)
Monocytes: 15 %
Neutrophils Absolute: 2 10*3/uL (ref 1.4–7.0)
Neutrophils: 39 %
Platelets: 368 10*3/uL (ref 150–450)
RBC: 3.88 x10E6/uL (ref 3.77–5.28)
RDW: 12.8 % (ref 11.7–15.4)
WBC: 5.3 10*3/uL (ref 3.4–10.8)

## 2020-06-23 NOTE — Progress Notes (Signed)
I have read the note, and I agree with the clinical assessment and plan.  Aurorah Schlachter K Antoninette Lerner   

## 2020-06-28 ENCOUNTER — Telehealth: Payer: Self-pay | Admitting: *Deleted

## 2020-06-28 NOTE — Telephone Encounter (Signed)
Spoke with patient and informed her that her labs showed alkaline phosphatase slightly elevated. We will continue to monitor this. Patient verbalized understanding, appreciation.

## 2020-11-21 ENCOUNTER — Telehealth: Payer: Self-pay

## 2020-11-21 ENCOUNTER — Telehealth: Payer: Self-pay | Admitting: Adult Health

## 2020-11-21 NOTE — Telephone Encounter (Signed)
Pt. states her left leg hurts, but not were she is not able to do anything. She states it is stiff at night when she gets ready for bed. She is asking is that part of shaking leg symptoms for MS. Please advise.

## 2020-11-21 NOTE — Telephone Encounter (Signed)
PA for Betaseron received from [facility]. Completed and faxed back. Received a receipt of confirmation. Should have a determination in 3-5 business days.

## 2020-11-21 NOTE — Telephone Encounter (Signed)
LMVM for pt that returning call.  

## 2020-11-22 NOTE — Telephone Encounter (Signed)
I am sure what she means by shaking leg?  Or what medication was used in the past by Dr. Erling Cruz?  She would need to come in for an office visit with me or the MD

## 2020-11-22 NOTE — Telephone Encounter (Signed)
Spoke to pt and she states that had what they called shaky leg (when had seen Dr. Erling Cruz) and was given some medication for this which helped she did not know what she was given.  She is now had since the weekend this again L leg,  It is not painful, just annoying.  Advil and massage does help. No other sx.  I see from 2014 note, RLS.  Please advise.

## 2020-11-23 NOTE — Telephone Encounter (Signed)
I called and LMVM for pt that per MM/NP not sure what she means about shaky leg or what med used by Dr. Erling Cruz in past. Needs appt with her or MD.  Needs to make appt first available, I placed on cancellation list. Pt to call back and make FA appt. Or see pcp.

## 2020-12-06 DIAGNOSIS — D84821 Immunodeficiency due to drugs: Secondary | ICD-10-CM | POA: Insufficient documentation

## 2020-12-06 DIAGNOSIS — Z79899 Other long term (current) drug therapy: Secondary | ICD-10-CM | POA: Insufficient documentation

## 2020-12-14 NOTE — Telephone Encounter (Signed)
I called [facility]. There is a PA approved for extavia until 12/09/2021.

## 2021-01-27 ENCOUNTER — Other Ambulatory Visit: Payer: Self-pay | Admitting: Neurology

## 2021-04-26 ENCOUNTER — Other Ambulatory Visit: Payer: Self-pay

## 2021-04-26 ENCOUNTER — Ambulatory Visit: Payer: BC Managed Care – PPO | Admitting: Adult Health

## 2021-04-26 ENCOUNTER — Encounter: Payer: Self-pay | Admitting: Adult Health

## 2021-04-26 VITALS — BP 110/74 | HR 96 | Ht 63.0 in | Wt 169.0 lb

## 2021-04-26 DIAGNOSIS — G2581 Restless legs syndrome: Secondary | ICD-10-CM | POA: Diagnosis not present

## 2021-04-26 DIAGNOSIS — G35 Multiple sclerosis: Secondary | ICD-10-CM | POA: Diagnosis not present

## 2021-04-26 NOTE — Progress Notes (Signed)
PATIENT: Joyce Bennett DOB: Mar 11, 1956  REASON FOR VISIT: follow up HISTORY FROM: patient  HISTORY OF PRESENT ILLNESS: Today 04/26/21:  Ms. Joyce Bennett is a 65 year old female with a history of multiple sclerosis.  She returns today for follow-up.  Overall she feels that she has been doing well.  She denies any new symptoms.  No new numbness or tingling.  No changes with her gait or balance.  No changes with the bowels or bladder.  No visual changes.  She remains on Betaseron.  She returns today for an evaluation.  HISTORY 06/18/19:  Ms. Joyce Bennett is a 65 year old female with a history of multiple sclerosis.  She returns today for follow-up.  She remains on Betaseron.  She denies any new symptoms.  No changes with her gait or balance.  No changes with her bowels or bladder.  No new numbness or weakness.  No changes with her vision.  Her last MRI of the brain was in 2017.  She joins me today for follow-up.  REVIEW OF SYSTEMS: Out of a complete 14 system review of symptoms, the patient complains only of the following symptoms, and all other reviewed systems are negative.  See HPI  ALLERGIES: Allergies  Allergen Reactions  . Keflex [Cephalexin]   . Shellfish Allergy Swelling    HOME MEDICATIONS: Outpatient Medications Prior to Visit  Medication Sig Dispense Refill  . BETASERON 0.3 MG KIT injection ADD 1.2 ML DILUENT TO VIAL AND MIX GENTLY. INJECT 1 ML SUBCUTANEOUSLY EVERY OTHER DAY. USE WITHIN 3 HOURS OF MIXING. STORE AT ROOM TEMPERATURE. 42 kit 5  . cholecalciferol (VITAMIN D) 1000 UNITS tablet Take 1,000 Units by mouth daily.    Marland Kitchen ESTRACE VAGINAL 0.1 MG/GM vaginal cream APPLY A PEA-SIZED AMOUNT TO VAGINAL OPENING EVERY MONDAY, WEDNESDAY, AND FRIDAY EVENING.  10  . Multiple Vitamin (MULTIVITAMIN) tablet Take 1 tablet by mouth daily.    Marland Kitchen terbinafine (LAMISIL) 250 MG tablet Take 250 mg by mouth daily.    Marland Kitchen trimethoprim (TRIMPEX) 100 MG tablet TAKE 1 TABLET BEDTIME  0    Facility-Administered Medications Prior to Visit  Medication Dose Route Frequency Provider Last Rate Last Admin  . gadopentetate dimeglumine (MAGNEVIST) injection 15 mL  15 mL Intravenous Once PRN Kathrynn Ducking, MD        PAST MEDICAL HISTORY: Past Medical History:  Diagnosis Date  . Multiple sclerosis ([place])   . Neurogenic bladder   . Optic neuritis, left   . Restless leg syndrome   . Urinary tract infection    Recurrent    PAST SURGICAL HISTORY: Past Surgical History:  Procedure Laterality Date  . FOOT SURGERY N/A   . HAND SURGERY Right     FAMILY HISTORY: Family History  Problem Relation Age of Onset  . Heart disease Maternal Grandmother   . Cancer Paternal Grandmother   . Liver cancer Sister     SOCIAL HISTORY: Social History   Socioeconomic History  . Marital status: Married    Spouse name: Not on file  . Number of children: 1  . Years of education: ASSOCIATE  . Highest education level: Not on file  Occupational History  . Occupation: International aid/development worker: [employer]  Tobacco Use  . Smoking status: Never Smoker  . Smokeless tobacco: Never Used  Substance and Sexual Activity  . Alcohol use: Yes    Comment: rare  . Drug use: No  . Sexual activity: Not on file  Other Topics Concern  .  Not on file  Social History Narrative   Patient is married with 1 child.   Patient is right handed.   Patient has a Associate's degree.   Patient drinks 5 cups daily.   Social Determinants of Health   Financial Resource Strain: Not on file  Food Insecurity: Not on file  Transportation Needs: Not on file  Physical Activity: Not on file  Stress: Not on file  Social Connections: Not on file  Intimate Partner Violence: Not on file      PHYSICAL EXAM  Vitals:   04/26/21 1026  BP: 110/74  Pulse: 96  Weight: 169 lb (76.7 kg)  Height: '5\' 3"'  (1.6 m)   Body mass index is 29.94 kg/m.  Generalized: Well developed, in no acute distress    Neurological examination  Mentation: Alert oriented to time, place, history taking. Follows all commands speech and language fluent Cranial nerve II-XII: Pupils were equal round reactive to light. Extraocular movements were full, visual field were full on confrontational test. Facial sensation and strength were normal. Uvula tongue midline. Head turning and shoulder shrug  were normal and symmetric. Motor: The motor testing reveals 5 over 5 strength of all 4 extremities. Good symmetric motor tone is noted throughout.  Sensory: Sensory testing is intact to soft touch on all 4 extremities. No evidence of extinction is noted.  Coordination: Cerebellar testing reveals good finger-nose-finger and heel-to-shin bilaterally.  Gait and station: Gait is normal.  Reflexes: Deep tendon reflexes are symmetric and normal bilaterally.   DIAGNOSTIC DATA (LABS, IMAGING, TESTING) - I reviewed patient records, labs, notes, testing and imaging myself where available.  Lab Results  Component Value Date   WBC 5.3 06/22/2020   HGB 11.0 (L) 06/22/2020   HCT 35.0 06/22/2020   MCV 90 06/22/2020   PLT 368 06/22/2020      Component Value Date/Time   NA 142 06/22/2020 1319   K 4.9 06/22/2020 1319   CL 105 06/22/2020 1319   CO2 26 06/22/2020 1319   GLUCOSE 73 06/22/2020 1319   GLUCOSE 80 02/12/2018 0256   BUN 18 06/22/2020 1319   CREATININE 0.77 06/22/2020 1319   CALCIUM 9.4 06/22/2020 1319   PROT 6.8 06/22/2020 1319   ALBUMIN 4.1 06/22/2020 1319   AST 15 06/22/2020 1319   ALT 8 06/22/2020 1319   ALKPHOS 143 (H) 06/22/2020 1319   BILITOT <0.2 06/22/2020 1319   GFRNONAA 83 06/22/2020 1319   GFRAA 96 06/22/2020 1319      ASSESSMENT AND PLAN 65 y.o. year old female  has a past medical history of Multiple sclerosis ([place]), Neurogenic bladder, Optic neuritis, left, Restless leg syndrome, and Urinary tract infection. here with:  Multiple sclerosis   Stable  Continue Betaseron   Last MRI of the  brain completed in 2020 was stable: will repeat next year or sooner if needed  Recent Blood work by PCP- will have then fax to our office  Restless leg syndrome   Resolved after taking Advil   Follow-up in 6 months or sooner if needed    Ward Givens, MSN, NP-C 04/26/2021, 10:58 AM [facility] [address], [place], [place] [address] [phone]

## 2021-04-26 NOTE — Patient Instructions (Signed)
Your Plan:  Continue betaseron If your symptoms worsen or you develop new symptoms please let us know.    Thank you for coming to see us at [facility]. I hope we have been able to provide you high quality care today.  You may receive a patient satisfaction survey over the next few weeks. We would appreciate your feedback and comments so that we may continue to improve ourselves and the health of our patients.  

## 2021-04-26 NOTE — Progress Notes (Signed)
I have read the note, and I agree with the clinical assessment and plan.  Pailyn Bellevue K Crescentia Boutwell   

## 2021-06-28 ENCOUNTER — Ambulatory Visit: Payer: BC Managed Care – PPO | Admitting: Adult Health

## 2021-07-05 ENCOUNTER — Other Ambulatory Visit: Payer: Self-pay | Admitting: Obstetrics and Gynecology

## 2021-07-05 DIAGNOSIS — R928 Other abnormal and inconclusive findings on diagnostic imaging of breast: Secondary | ICD-10-CM

## 2021-07-19 ENCOUNTER — Other Ambulatory Visit: Payer: Self-pay | Admitting: Obstetrics and Gynecology

## 2021-07-19 ENCOUNTER — Other Ambulatory Visit: Payer: Self-pay

## 2021-07-19 ENCOUNTER — Ambulatory Visit
Admission: RE | Admit: 2021-07-19 | Discharge: 2021-07-19 | Disposition: A | Discharge status: Home / Self Care | Payer: BC Managed Care – PPO | Source: Ambulatory Visit | Attending: Obstetrics and Gynecology | Admitting: Obstetrics and Gynecology

## 2021-07-19 DIAGNOSIS — R928 Other abnormal and inconclusive findings on diagnostic imaging of breast: Secondary | ICD-10-CM

## 2021-07-19 DIAGNOSIS — R921 Mammographic calcification found on diagnostic imaging of breast: Secondary | ICD-10-CM

## 2021-07-31 ENCOUNTER — Ambulatory Visit
Admission: RE | Admit: 2021-07-31 | Discharge: 2021-07-31 | Disposition: A | Discharge status: Home / Self Care | Payer: BC Managed Care – PPO | Source: Ambulatory Visit | Attending: Obstetrics and Gynecology | Admitting: Obstetrics and Gynecology

## 2021-07-31 ENCOUNTER — Other Ambulatory Visit: Payer: Self-pay

## 2021-07-31 DIAGNOSIS — R921 Mammographic calcification found on diagnostic imaging of breast: Secondary | ICD-10-CM

## 2021-08-02 ENCOUNTER — Other Ambulatory Visit: Payer: Self-pay | Admitting: Obstetrics and Gynecology

## 2021-08-02 DIAGNOSIS — C50919 Malignant neoplasm of unspecified site of unspecified female breast: Secondary | ICD-10-CM

## 2021-08-03 ENCOUNTER — Telehealth: Payer: Self-pay | Admitting: Hematology and Oncology

## 2021-08-03 ENCOUNTER — Encounter: Payer: Self-pay | Admitting: *Deleted

## 2021-08-03 DIAGNOSIS — C50512 Malignant neoplasm of lower-outer quadrant of left female breast: Secondary | ICD-10-CM

## 2021-08-03 NOTE — Telephone Encounter (Signed)
Spoke to patient to confirm afternoon clinic appointment for 8/31, paperwork sent via e-mail

## 2021-08-08 ENCOUNTER — Ambulatory Visit
Admission: RE | Admit: 2021-08-08 | Discharge: 2021-08-08 | Disposition: A | Discharge status: Home / Self Care | Payer: BC Managed Care – PPO | Source: Ambulatory Visit | Attending: Obstetrics and Gynecology | Admitting: Obstetrics and Gynecology

## 2021-08-08 ENCOUNTER — Other Ambulatory Visit: Payer: Self-pay

## 2021-08-08 DIAGNOSIS — C50919 Malignant neoplasm of unspecified site of unspecified female breast: Secondary | ICD-10-CM

## 2021-08-08 NOTE — Progress Notes (Signed)
[facility] CONSULT NOTE  Patient Care Team: [name], MD as PCP - General (Family Medicine) Rockwell Germany, RN as Oncology Nurse Navigator Mauro Kaufmann, RN as Oncology Nurse Navigator Jovita Kussmaul, MD as Consulting Physician (General Surgery) Nicholas Lose, MD as Consulting Physician (Hematology and Oncology) Gery Pray, MD as Consulting Physician (Radiation Oncology)  CHIEF COMPLAINTS/PURPOSE OF CONSULTATION:  Newly diagnosed breast cancer  HISTORY OF PRESENTING ILLNESS:  Joyce Bennett 65 y.o. female is here because of recent diagnosis of DCIS of the left breast. Left diagnostic mammogram on 07/19/2021 showed suspicious calcifications in the lower outer quadrant of the left breast. 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 She presents to the clinic today for initial evaluation and discussion of treatment options.   I reviewed her records extensively and collaborated the history with the patient.  SUMMARY OF ONCOLOGIC HISTORY: Oncology History  Malignant neoplasm of lower-outer quadrant of left breast of female, estrogen receptor positive ([place])  08/03/2021 Initial Diagnosis   Left diagnostic mammogram: suspicious calcifications in the lower outer quadrant of the left breast. 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    08/09/2021 Cancer Staging   Staging form: Breast, AJCC 8th Edition - Clinical stage from 08/09/2021: Stage IA (cT85m, cN0, cM0, G2, ER+, PR-, HER2-) - Signed by GNicholas Lose MD on 08/09/2021 Stage prefix: Initial diagnosis Histologic grading system: 3 grade system     MEDICAL HISTORY:  Past Medical History:  Diagnosis Date   Breast cancer ([place]    Multiple sclerosis ([place]    Neurogenic bladder    Optic neuritis, left    Restless leg syndrome    Urinary tract infection    Recurrent    SURGICAL HISTORY: Past Surgical  History:  Procedure Laterality Date   FOOT SURGERY N/A    HAND SURGERY Right     SOCIAL HISTORY: Social History   Socioeconomic History   Marital status: Married    Spouse name: Not on file   Number of children: 1   Years of education: ASSOCIATE   Highest education level: Not on file  Occupational History   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   Patient is right handed.   Patient has a Associate's degree.   Patient drinks 5 cups daily.   Social Determinants of Health   Financial Resource Strain: Not on file  Food Insecurity: Not on file  Transportation Needs: Not on file  Physical Activity: Not on file  Stress: Not on file  Social Connections: Not on file  Intimate Partner Violence: Not on file    FAMILY HISTORY: Family History  Problem Relation Age of Onset   Liver cancer Mother    Liver cancer Sister    Heart disease Maternal Grandmother    Cancer Paternal Grandmother    Breast cancer Paternal Grandmother     ALLERGIES:  is allergic to keflex [cephalexin] and shellfish allergy.  MEDICATIONS:  Current Outpatient Medications  Medication Sig Dispense Refill   BETASERON 0.3 MG KIT injection ADD 1.2 ML DILUENT TO VIAL  AND MIX GENTLY. INJECT 1 ML SUBCUTANEOUSLY EVERY OTHER DAY. USE WITHIN 3 HOURS OF MIXING. STORE AT ROOM TEMPERATURE. 42 kit 5   cholecalciferol (VITAMIN D) 1000 UNITS tablet Take 1,000 Units by mouth daily.     Multiple Vitamin (MULTIVITAMIN) tablet Take 1 tablet by mouth daily.     vitamin C (ASCORBIC ACID) 250 MG tablet Take 250 mg by mouth daily.     Zinc Oxide-Vitamin C (ZINC PLUS VITAMIN C PO) Take by mouth.     ESTRACE VAGINAL 0.1 MG/GM vaginal  cream APPLY A PEA-SIZED AMOUNT TO VAGINAL OPENING EVERY MONDAY, WEDNESDAY, AND FRIDAY EVENING. (Patient not taking: Reported on 08/09/2021)  10   terbinafine (LAMISIL) 250 MG tablet Take 250 mg by mouth daily. (Patient not taking: Reported on 08/09/2021)     trimethoprim (TRIMPEX) 100 MG tablet TAKE 1 TABLET BEDTIME (Patient not taking: Reported on 08/09/2021)  0   No current facility-administered medications for this visit.   Facility-Administered Medications Ordered in Other Visits  Medication Dose Route Frequency Provider Last Rate Last Admin   gadopentetate dimeglumine (MAGNEVIST) injection 15 mL  15 mL Intravenous Once PRN Kathrynn Ducking, MD        REVIEW OF SYSTEMS:   All other systems were reviewed with the patient and are negative.  PHYSICAL EXAMINATION: ECOG PERFORMANCE STATUS: 0 - Asymptomatic  Vitals:   08/09/21 1305  BP: (!) 147/88  Pulse: 90  Resp: 16  Temp: 97.9 F (36.6 C)  SpO2: 100%   Filed Weights   08/09/21 1305  Weight: 169 lb 9.6 oz (76.9 kg)       LABORATORY DATA:  I have reviewed the data as listed Lab Results  Component Value Date   WBC 5.8 08/09/2021   HGB 11.3 (L) 08/09/2021   HCT 35.7 (L) 08/09/2021   MCV 91.1 08/09/2021   PLT 323 08/09/2021   Lab Results  Component Value Date   NA 141 08/09/2021   K 4.3 08/09/2021   CL 105 08/09/2021   CO2 27 08/09/2021    RADIOGRAPHIC STUDIES: I have personally reviewed the radiological reports and agreed with the findings in the report.  ASSESSMENT AND PLAN:  Malignant neoplasm of lower-outer quadrant of left breast of female, estrogen receptor positive ([place]) 08/03/2021:Left diagnostic mammogram: suspicious calcifications in the lower outer quadrant of the left breast. 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   Pathology and radiology counseling:Discussed with the patient, the details of pathology including the type of breast cancer,the clinical staging,  the significance of ER, PR and HER-2/neu receptors and the implications for treatment. After reviewing the pathology in detail, we proceeded to discuss the different treatment options between surgery, radiation, chemotherapy, antiestrogen therapies.  Recommendations: 1. Breast conserving surgery followed by 2. Oncotype DX testing to determine if chemotherapy would be of any benefit followed by 3. Adjuvant radiation therapy followed by 4. Adjuvant antiestrogen therapy  Oncotype counseling: I discussed Oncotype DX test. I explained to the patient that this is a 21 gene panel to evaluate patient tumors DNA to calculate recurrence score. This would help determine whether patient has high risk or low risk breast cancer. She understands that if her tumor was found to be high risk, she would benefit from systemic chemotherapy. If low risk, no need of chemotherapy.  Return to clinic after surgery to discuss final pathology report and then determine if Oncotype DX testing will need to be sent.    All questions were answered. The  patient knows to call the clinic with any problems, questions or concerns.   Rulon Eisenmenger, MD, MPH 08/09/2021    I, Thana Ates, am acting as scribe for Nicholas Lose, MD.  I have reviewed the above documentation for accuracy and completeness, and I agree with the above.

## 2021-08-08 NOTE — Progress Notes (Signed)
Radiation Oncology         [phone] ________________________________  Multidisciplinary Breast Oncology Clinic [facility]) Initial Outpatient Consultation  Name: Joyce Bennett MRN: [id number]  Date: 08/09/2021  DOB: 10-03-1956  CC:Joyce Croak, MD  Joyce Kussmaul, MD   REFERRING PHYSICIAN: [name], MD  DIAGNOSIS: The encounter diagnosis was Malignant neoplasm of lower-outer quadrant of left breast of female, estrogen receptor positive ([place]).  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 Malignant neoplasm of lower-outer quadrant of left breast of female, estrogen receptor positive ([place]  C50.512    Z17.0       HISTORY OF PRESENT ILLNESS::Joyce YDEBORA STOCKDALEis a 65y.o. female who is presenting to the office today for evaluation of her newly diagnosed breast cancer. She is accompanied by her husband. She is doing well overall.   She underwent unilateral left breast [facility]21 showing: suspicious calcifications in the lower outer quadrant of the left breast.   Left breast biopsy on 07/31/21 showed: a focus of microinvasive carcinoma measuring 0.1 cm and intermediate to high-grade grade DCIS with calcifications . Prognostic indicators significant for: estrogen receptor, 95% positive, with strong staining intensity, and progesterone receptor, 0% negative. Proliferation marker Ki67 at 50%. HER2 negative.   Menarche: 65years old Age at first live birth: 65years old GP: 1 LMP: When she was 65y.o Contraceptive use: Yes (no further elaboration) HRT: None   The patient was referred today for presentation in the multidisciplinary conference.  Radiology studies and pathology slides were presented there for review and discussion of treatment options.  A consensus was discussed regarding potential next  steps.  PREVIOUS RADIATION THERAPY: No  PAST MEDICAL HISTORY:  Past Medical History:  Diagnosis Date   Breast cancer ([place]    Multiple [place] ([place]    Neurogenic bladder    Optic neuritis, left    Restless leg syndrome    Urinary tract infection    Recurrent    PAST SURGICAL HISTORY: Past Surgical History:  Procedure Laterality Date   FOOT SURGERY N/A    HAND SURGERY Right     FAMILY HISTORY:  Family History  Problem Relation Age of Onset   Liver cancer Mother    Liver cancer Sister    Heart disease Maternal Grandmother    Cancer Paternal Grandmother    Breast cancer Paternal Grandmother     SOCIAL HISTORY:  Social History   Socioeconomic History   Marital status: Married    Spouse name: Not on file   Number of children: 1   Years of education: ASSOCIATE   Highest education level: Not on file  Occupational History   [place]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   Patient is right handed.   Patient has a Associate's degree.   Patient drinks 5 cups daily.   Social Determinants of Health   Financial Resource Strain: Not on file  Food Insecurity: Not on file  Transportation Needs: Not on file  Physical Activity: Not on file  Stress: Not on file  Social Connections: Not on file    ALLERGIES:  Allergies  Allergen Reactions   Keflex [Cephalexin]    Shellfish Allergy Swelling    MEDICATIONS:  Current Outpatient Medications  Medication Sig Dispense Refill   BETASERON 0.3 MG KIT injection ADD 1.2 ML DILUENT TO VIAL AND MIX GENTLY. INJECT 1 ML SUBCUTANEOUSLY EVERY OTHER DAY. USE WITHIN 3 HOURS OF MIXING. STORE AT ROOM TEMPERATURE. 42 kit 5   cholecalciferol  (VITAMIN D) 1000 UNITS tablet Take 1,000 Units by mouth daily.     ESTRACE VAGINAL 0.1 MG/GM vaginal cream APPLY A PEA-SIZED AMOUNT TO VAGINAL OPENING EVERY MONDAY, WEDNESDAY, AND FRIDAY EVENING. (Patient not taking: Reported on 08/09/2021)  10   Multiple Vitamin (MULTIVITAMIN) tablet Take 1 tablet by mouth daily.     terbinafine (LAMISIL) 250 MG tablet Take 250 mg by mouth daily. (Patient not taking: Reported on 08/09/2021)     trimethoprim (TRIMPEX) 100 MG tablet TAKE 1 TABLET BEDTIME (Patient not taking: Reported on 08/09/2021)  0   vitamin C (ASCORBIC ACID) 250 MG tablet Take 250 mg by mouth daily.     Zinc Oxide-Vitamin C (ZINC PLUS VITAMIN C PO) Take by mouth.     No current facility-administered medications for this encounter.   Facility-Administered Medications Ordered in Other Encounters  Medication Dose Route Frequency Provider Last Rate Last Admin   gadopentetate dimeglumine (MAGNEVIST) injection 15 mL  15 mL Intravenous Once PRN Kathrynn Ducking, MD        REVIEW OF SYSTEMS: A 10+ POINT REVIEW OF SYSTEMS WAS OBTAINED including neurology, dermatology, psychiatry, cardiac, respiratory, lymph, extremities, GI, GU, musculoskeletal, constitutional, reproductive, HEENT. On the provided form, she reports night sweats, fatigue (which does not affect her activities), wearing glasses, ear infections, and multiple sclerosis. She denies any other symptoms.    PHYSICAL EXAM:   Vitals with BMI 08/09/2021  Height '5\' 3"'   Weight 169 lbs 10 oz  BMI 76.16  Systolic 073  Diastolic 88  Pulse 90   Lungs are clear to auscultation bilaterally. Heart has regular rate and rhythm. No palpable cervical, supraclavicular, or axillary adenopathy. Abdomen soft, non-tender, normal bowel sounds. Breast: Right breast is large and pendulous without palpable mass, nipple discharge, or bleeding. Left breast with some bruising in the LOQ, no dominant mass, nipple discharge or bleeding appreciated in the left breast;  left breast is also large and pendulous.   KPS = 100  100 - Normal; no complaints; no evidence of disease. 90   - Able to carry on normal activity; minor signs or symptoms of disease. 80   - Normal activity with effort; some signs or symptoms of disease. 90   - Cares for self; unable to carry on normal activity or to do active work. 60   - Requires occasional assistance, but is able to care for most of his personal needs. 50   - Requires considerable assistance and frequent medical care. 44   - Disabled; requires special care and assistance. 42   - Severely disabled; hospital admission is indicated although death not imminent. 79   - Very sick; hospital admission necessary; active supportive treatment necessary. 10   - Moribund; fatal processes progressing rapidly. 0     - Dead  Karnofsky DA, Abelmann WH, Craver LS and Burchenal [facility] [phone]) The use of the nitrogen mustards in the palliative treatment of carcinoma: with particular reference to  bronchogenic carcinoma Cancer 1 634-56  LABORATORY DATA:  Lab Results  Component Value Date   WBC 5.8 08/09/2021   HGB 11.3 (L) 08/09/2021   HCT 35.7 (L) 08/09/2021   MCV 91.1 08/09/2021   PLT 323 08/09/2021   Lab Results  Component Value Date   NA 141 08/09/2021   K 4.3 08/09/2021   CL 105 08/09/2021   CO2 27 08/09/2021   Lab Results  Component Value Date   ALT 13 08/09/2021   AST 24 08/09/2021   ALKPHOS 130 (H) 08/09/2021   BILITOT 0.3 08/09/2021    PULMONARY FUNCTION TEST:   Recent Review Flowsheet Data   There is no flowsheet data to display.     RADIOGRAPHY: MM Digital Diagnostic Unilat L  Result Date: 07/19/2021 CLINICAL DATA:  Patient was recalled from screening mammogram for left breast calcifications. EXAM: DIGITAL DIAGNOSTIC UNILATERAL LEFT MAMMOGRAM WITH CAD TECHNIQUE: Left digital diagnostic mammography was performed. Mammographic images were processed with CAD. COMPARISON:  Previous exam(s). ACR Breast Density  Category b: There are scattered areas of fibroglandular density. FINDINGS: Additional imaging of the left breast was performed. There are developing grouped pleomorphic and linear calcifications in the lower outer quadrant of the left breast spanning an area of 1.8 cm. IMPRESSION: Suspicious calcifications in the lower outer quadrant of the left breast. RECOMMENDATION: Stereotactic biopsy of calcifications in the lower outer quadrant of the left breast is recommended. The biopsy will be scheduled at the patient's convenience. I have discussed the findings and recommendations with the patient. If applicable, a reminder letter will be sent to the patient regarding the next appointment. BI-RADS CATEGORY  4: Suspicious. Electronically Signed   By: Lillia Mountain M.D.   On: 07/19/2021 13:19  [place] AXILLA LEFT  Result Date: 08/08/2021 CLINICAL DATA:  65 year old female presenting for left axillary ultrasound to evaluate lymph nodes secondary to recent diagnosis of a focus of micro invasive carcinoma and intermediate to high-grade DCIS in the left breast. EXAM: ULTRASOUND OF THE LEFT AXILLA COMPARISON:  Previous exam(s). FINDINGS: Ultrasound of left axilla demonstrates multiple normal-appearing lymph nodes. No suspicious masses are identified. IMPRESSION: No evidence of left axillary lymphadenopathy. RECOMMENDATION: Continue treatment plan. I have discussed the findings and recommendations with the patient. If applicable, a reminder letter will be sent to the patient regarding the next appointment. BI-RADS CATEGORY  1: Negative. Electronically Signed   By: Ammie Ferrier M.D.   On: 08/08/2021 11:23  MM CLIP PLACEMENT LEFT  Result Date: 07/31/2021 CLINICAL DATA:  Evaluate COIL biopsy clip placement following stereotactic guided LEFT breast biopsy. EXAM: 3D DIAGNOSTIC LEFT MAMMOGRAM POST STEREOTACTIC BIOPSY COMPARISON:  Previous exam(s). FINDINGS: 3D Mammographic images were obtained following stereotactic guided biopsy  of LOWER OUTER LEFT breast calcifications. The COIL biopsy marking clip is in expected position at the site of biopsy. IMPRESSION: Appropriate positioning of the COIL shaped biopsy marking clip at the site of biopsy in the LOWER OUTER LEFT breast. Final Assessment: Post Procedure Mammograms for Marker Placement Electronically Signed   By: Margarette Canada M.D.   On: 07/31/2021 12:19  MM LT BREAST BX W LOC DEV 1ST LESION IMAGE BX SPEC STEREO GUIDE  Addendum Date: 08/08/2021   ADDENDUM REPORT: 08/08/2021 11:57 ADDENDUM: Only a LEFT axillary ultrasound is scheduled and should be performed on 08/08/2021. Electronically Signed   By: Margarette Canada M.D.   On: 08/08/2021 11:57   Addendum Date: 08/03/2021   ADDENDUM REPORT: 08/03/2021 11:08 ADDENDUM: Pathology revealed FOCUS OF MICROINVASIVE CARCINOMA,  INTERMEDIATE TO HIGH-GRADE DUCTAL CARCINOMA IN SITU WITH CALCIFICATIONS of the LEFT breast, lower outer, coil clip. This was found to be concordant by Dr. Hassan Rowan. Pathology results were discussed with the patient by telephone. The patient reported doing well after the biopsy with tenderness at the site. Post biopsy instructions and care were reviewed and questions were answered. The patient was encouraged to call [facility] for any additional concerns. The patient was referred to [facility] on August 09, 2021. Given results, patient is scheduled for RIGHT diagnostic mammogram and LEFT axillary ultrasound on August 08, 2021. Pathology results reported by Stacie Acres RN on 08/03/2021. Electronically Signed   By: Margarette Canada M.D.   On: 08/03/2021 11:08   Result Date: 08/08/2021 CLINICAL DATA:  65 year old female for tissue sampling of LOWER OUTER LEFT breast calcifications. EXAM: LEFT BREAST STEREOTACTIC CORE NEEDLE BIOPSY COMPARISON:  Previous exams. FINDINGS: The patient and I discussed the procedure of stereotactic-guided  biopsy including benefits and alternatives. We discussed the high likelihood of a successful procedure. We discussed the risks of the procedure including infection, bleeding, tissue injury, clip migration, and inadequate sampling. Informed written consent was given. The usual time out protocol was performed immediately prior to the procedure. Using sterile technique and 1% Lidocaine with and without epinephrine as local anesthetic, under stereotactic guidance, a 9 gauge vacuum assisted device was used to perform core needle biopsy of 1.8 cm group of calcifications within the LOWER OUTER LEFT breast using a LATERAL approach. Specimen radiograph was performed showing calcifications. Specimens with calcifications are identified for pathology. Lesion quadrant: LOWER OUTER LEFT breast At the conclusion of the procedure, a COIL tissue marker clip was deployed into the biopsy cavity. Follow-up 2-view mammogram was performed and dictated separately. IMPRESSION: Stereotactic-guided biopsy of LOWER OUTER LEFT breast calcifications. No apparent complications. Electronically Signed: By: Margarette Canada M.D. 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 We discussed the general course of radiation, potential side effects, and toxicities with radiation and the patient is interested in this approach.   Depending on the results of sentinel node and breast set up position, she may not be a candidate for hypofractionated accelerated radiation therapy; especially given her large breast size.    PLAN:  Genetics  Left lumpectomy with sentinel lymph node biopsy  Adjuvant radiation therapy  Aromatase inhibitor     ------------------------------------------------  JBlair Promise PhD, MD  This  document serves as a record of services personally performed by JGery Pray MD. It was created on his behalf by ERoney Mans a trained medical scribe. The creation of this record is based on the scribe's personal observations and the provider's statements to them. This document has been checked and approved by the attending provider.

## 2021-08-09 ENCOUNTER — Encounter: Payer: Self-pay | Admitting: General Practice

## 2021-08-09 ENCOUNTER — Ambulatory Visit (HOSPITAL_BASED_OUTPATIENT_CLINIC_OR_DEPARTMENT_OTHER): Payer: BC Managed Care – PPO | Admitting: Genetic Counselor

## 2021-08-09 ENCOUNTER — Ambulatory Visit: Payer: BC Managed Care – PPO | Attending: General Surgery | Admitting: Physical Therapy

## 2021-08-09 ENCOUNTER — Encounter: Payer: Self-pay | Admitting: *Deleted

## 2021-08-09 ENCOUNTER — Inpatient Hospital Stay: Payer: BC Managed Care – PPO | Attending: Hematology and Oncology

## 2021-08-09 ENCOUNTER — Ambulatory Visit
Admission: RE | Admit: 2021-08-09 | Discharge: 2021-08-09 | Disposition: A | Discharge status: Home / Self Care | Payer: BC Managed Care – PPO | Source: Ambulatory Visit | Attending: Radiation Oncology | Admitting: Radiation Oncology

## 2021-08-09 ENCOUNTER — Encounter: Payer: Self-pay | Admitting: Physical Therapy

## 2021-08-09 ENCOUNTER — Inpatient Hospital Stay (HOSPITAL_BASED_OUTPATIENT_CLINIC_OR_DEPARTMENT_OTHER): Payer: BC Managed Care – PPO | Admitting: Hematology and Oncology

## 2021-08-09 ENCOUNTER — Encounter: Payer: Self-pay | Admitting: Hematology and Oncology

## 2021-08-09 ENCOUNTER — Ambulatory Visit: Payer: Self-pay | Admitting: General Surgery

## 2021-08-09 DIAGNOSIS — Z170 Estrogen receptor positive status [ER+]: Secondary | ICD-10-CM

## 2021-08-09 DIAGNOSIS — G2581 Restless legs syndrome: Secondary | ICD-10-CM | POA: Insufficient documentation

## 2021-08-09 DIAGNOSIS — R293 Abnormal posture: Secondary | ICD-10-CM

## 2021-08-09 DIAGNOSIS — C50512 Malignant neoplasm of lower-outer quadrant of left female breast: Secondary | ICD-10-CM

## 2021-08-09 DIAGNOSIS — C50412 Malignant neoplasm of upper-outer quadrant of left female breast: Secondary | ICD-10-CM | POA: Insufficient documentation

## 2021-08-09 DIAGNOSIS — G35 Multiple sclerosis: Secondary | ICD-10-CM | POA: Diagnosis not present

## 2021-08-09 DIAGNOSIS — Z8042 Family history of malignant neoplasm of prostate: Secondary | ICD-10-CM

## 2021-08-09 DIAGNOSIS — Z803 Family history of malignant neoplasm of breast: Secondary | ICD-10-CM

## 2021-08-09 LAB — CBC WITH DIFFERENTIAL (CANCER CENTER ONLY)
Abs Immature Granulocytes: 0.01 10*3/uL (ref 0.00–0.07)
Basophils Absolute: 0 10*3/uL (ref 0.0–0.1)
Basophils Relative: 1 %
Eosinophils Absolute: 0.2 10*3/uL (ref 0.0–0.5)
Eosinophils Relative: 3 %
HCT: 35.7 % — ABNORMAL LOW (ref 36.0–46.0)
Hemoglobin: 11.3 g/dL — ABNORMAL LOW (ref 12.0–15.0)
Immature Granulocytes: 0 %
Lymphocytes Relative: 38 %
Lymphs Abs: 2.2 10*3/uL (ref 0.7–4.0)
MCH: 28.8 pg (ref 26.0–34.0)
MCHC: 31.7 g/dL (ref 30.0–36.0)
MCV: 91.1 fL (ref 80.0–100.0)
Monocytes Absolute: 0.7 10*3/uL (ref 0.1–1.0)
Monocytes Relative: 11 %
Neutro Abs: 2.7 10*3/uL (ref 1.7–7.7)
Neutrophils Relative %: 47 %
Platelet Count: 323 10*3/uL (ref 150–400)
RBC: 3.92 MIL/uL (ref 3.87–5.11)
RDW: 13.3 % (ref 11.5–15.5)
WBC Count: 5.8 10*3/uL (ref 4.0–10.5)
nRBC: 0 % (ref 0.0–0.2)

## 2021-08-09 LAB — CMP (CANCER CENTER ONLY)
ALT: 13 U/L (ref 0–44)
AST: 24 U/L (ref 15–41)
Albumin: 3.8 g/dL (ref 3.5–5.0)
Alkaline Phosphatase: 130 U/L — ABNORMAL HIGH (ref 38–126)
Anion gap: 9 (ref 5–15)
BUN: 15 mg/dL (ref 8–23)
CO2: 27 mmol/L (ref 22–32)
Calcium: 9.5 mg/dL (ref 8.9–10.3)
Chloride: 105 mmol/L (ref 98–111)
Creatinine: 0.84 mg/dL (ref 0.44–1.00)
GFR, Estimated: >60 mL/min (ref >60)
Glucose, Bld: 80 mg/dL (ref 70–99)
Potassium: 4.3 mmol/L (ref 3.5–5.1)
Sodium: 141 mmol/L (ref 135–145)
Total Bilirubin: 0.3 mg/dL (ref 0.3–1.2)
Total Protein: 7.6 g/dL (ref 6.5–8.1)

## 2021-08-09 LAB — GENETIC SCREENING ORDER

## 2021-08-09 NOTE — Assessment & Plan Note (Signed)
08/03/2021:Left diagnostic mammogram: suspicious calcifications in the lower outer quadrant of the left breast. 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   Pathology and radiology counseling:Discussed with the patient, the details of pathology including the type of breast cancer,the clinical staging, the significance of ER, PR and HER-2/neu receptors and the implications for treatment. After reviewing the pathology in detail, we proceeded to discuss the different treatment options between surgery, radiation, chemotherapy, antiestrogen therapies.  Recommendations: 1. Breast conserving surgery followed by 2. Oncotype DX testing to determine if chemotherapy would be of any benefit followed by 3. Adjuvant radiation therapy followed by 4. Adjuvant antiestrogen therapy  Oncotype counseling: I discussed Oncotype DX test. I explained to the patient that this is a 21 gene panel to evaluate patient tumors DNA to calculate recurrence score. This would help determine whether patient has high risk or low risk breast cancer. She understands that if her tumor was found to be high risk, she would benefit from systemic chemotherapy. If low risk, no need of chemotherapy.  Return to clinic after surgery to discuss final pathology report and then determine if Oncotype DX testing will need to be sent.

## 2021-08-09 NOTE — Patient Instructions (Signed)

## 2021-08-09 NOTE — Progress Notes (Signed)
[place] Psychosocial Distress Screening Spiritual Care  Met with Kera in [facility] to introduce [facility] team/resources, reviewing distress screen per protocol.  The patient scored a  [unspecified]  on the Psychosocial Distress Thermometer which indicates  [unspecified]  distress. Also assessed for distress and other psychosocial needs.   ONCBCN DISTRESS SCREENING 08/09/2021  Family Problem type Children  Referral to support programs Yes   Ms Gerling presented as very calm and settled, reporting little distress. Her main concern is talking to her 64yo daughter about her diagnosis, because she anticipates significant distress.   Follow up needed: No. Ms Char prefers to reach out as needed. She is also aware of Spiritual Care's availability for family support if desired.   [place], [place], [facility] Pager [phone] Voicemail [phone]

## 2021-08-09 NOTE — Therapy (Signed)
[place], [place], [address] Phone: [phone]   Fax:  [phone]  Physical Therapy Evaluation  Patient Details  Name: Joyce Bennett MRN: [id number] Date of Birth: [birth year] Referring Provider (PT): Dr. Autumn Messing   Encounter Date: 08/09/2021   PT End of Session - 08/09/21 1522     Visit Number 1    Number of Visits 2    Date for PT Re-Evaluation 10/04/21    PT Start Time 4431    PT Stop Time 1335    PT Time Calculation (min) 27 min    Activity Tolerance Patient tolerated treatment well    Behavior During Therapy [facility] for tasks assessed/performed             Past Medical History:  Diagnosis Date   Breast cancer ([place])    Multiple sclerosis ([place])    Neurogenic bladder    Optic neuritis, left    Restless leg syndrome    Urinary tract infection    Recurrent    Past Surgical History:  Procedure Laterality Date   FOOT SURGERY N/A    HAND SURGERY Right     There were no vitals filed for this visit.    Subjective Assessment - 08/09/21 1412     Subjective Patient reports she is here today to be seen by hre medical team for her newly diagnosed left breast cancer.    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 It measures 1.8 cm and is located in the lower outer quadrant. It is ER positive, PR negative, and HER2 negative with a Ki67 of 50%. She had right hand surgery 40 years ago with hardware placed but it has since been removed.    Patient Stated Goals reduce lymphedema risk and learn post op shoulder ROM HEP    Currently in Pain? No/denies                [facility] PT Assessment - 08/09/21 0001       Assessment   Medical Diagnosis Left breast cancer    Referring Provider (PT) Dr. Autumn Messing    Onset Date/Surgical Date 07/03/21    Hand Dominance Right     Prior Therapy none      Precautions   Precautions Other (comment)    Precaution Comments active cancer      Restrictions   Weight Bearing Restrictions No      Balance Screen   Has the patient fallen in the past 6 months No    Has the patient had a decrease in activity level because of a fear of falling?  No    Is the patient reluctant to leave their home because of a fear of falling?  No      Home Environment   Living Environment Private residence    Living Arrangements Spouse/significant other;Children   Husband and 64 y.o. daughter   Available Help at Discharge Family      Prior Function   Level of Independence Independent    Vocation Full time employment    Vocation Requirements Drives school bus for [place] She walks 10-15 min/day      Cognition   Overall Cognitive Status Within Functional Limits for tasks assessed      Posture/Postural Control   Posture/Postural Control Postural limitations    Postural Limitations Rounded Shoulders;Forward  head      ROM / Strength   AROM / PROM / Strength AROM;Strength      AROM   Overall AROM Comments Cervical AROM is WNL    AROM Assessment Site Shoulder    Right/Left Shoulder Right;Left    Right Shoulder Extension 55 Degrees    Right Shoulder Flexion 140 Degrees    Right Shoulder ABduction 158 Degrees    Right Shoulder Internal Rotation 78 Degrees    Right Shoulder External Rotation 80 Degrees    Left Shoulder Extension 50 Degrees    Left Shoulder Flexion 144 Degrees    Left Shoulder ABduction 145 Degrees    Left Shoulder Internal Rotation 78 Degrees    Left Shoulder External Rotation 85 Degrees      Strength   Overall Strength Within functional limits for tasks performed               LYMPHEDEMA/ONCOLOGY QUESTIONNAIRE - 08/09/21 0001       Type   Cancer Type Left breast cancer      Lymphedema Assessments   Lymphedema Assessments Upper extremities      Right Upper Extremity Lymphedema   10 cm  Proximal to Olecranon Process 29 cm    Olecranon Process 24 cm    10 cm Proximal to Ulnar Styloid Process 20.5 cm    Just Proximal to Ulnar Styloid Process 14.7 cm    Across Hand at [employer] 17.2 cm    At [place] of 2nd Digit 6 cm      Left Upper Extremity Lymphedema   10 cm Proximal to Olecranon Process 28.2 cm    Olecranon Process 24.4 cm    10 cm Proximal to Ulnar Styloid Process 20 cm    Just Proximal to Ulnar Styloid Process 14.8 cm    Across Hand at [employer] 18.1 cm    At [place] of 2nd Digit 5.8 cm             L-DEX FLOWSHEETS - 08/09/21 1500       L-DEX LYMPHEDEMA SCREENING   Measurement Type Unilateral    L-DEX MEASUREMENT EXTREMITY Upper Extremity    POSITION  Standing    DOMINANT SIDE Right    At Risk Side Left    BASELINE SCORE (UNILATERAL) 4.5             The patient was assessed using the L-Dex machine today to produce a lymphedema index baseline score. The patient will be reassessed on a regular basis (typically every 3 months) to obtain new L-Dex scores. If the score is > 6.5 points away from his/her baseline score indicating onset of subclinical lymphedema, it will be recommended to wear a compression garment for 4 weeks, 12 hours per day and then be reassessed. If the score continues to be > 6.5 points from baseline at reassessment, we will initiate lymphedema treatment. Assessing in this manner has a 95% rate of preventing clinically significant lymphedema.      Katina Dung - 08/09/21 0001     Open a tight or new jar Mild difficulty    Do heavy household chores (wash walls, wash floors) No difficulty    Carry a shopping bag or briefcase No difficulty    Wash your back Mild difficulty    Use a knife to cut food No difficulty    Recreational activities in which you take some force or impact through your arm, shoulder, or hand (golf, hammering, tennis) No difficulty  During the past week, to what extent has your arm, shoulder or hand problem  interfered with your normal social activities with family, friends, neighbors, or groups? Not at all    During the past week, to what extent has your arm, shoulder or hand problem limited your work or other regular daily activities Not at all    Arm, shoulder, or hand pain. None    Tingling (pins and needles) in your arm, shoulder, or hand None    Difficulty Sleeping No difficulty    DASH Score 4.55 %              Objective measurements completed on examination: See above findings.         Patient was instructed today in a home exercise program today for post op shoulder range of motion. These included active assist shoulder flexion in sitting, scapular retraction, wall walking with shoulder abduction, and hands behind head external rotation.  She was encouraged to do these twice a day, holding 3 seconds and repeating 5 times when permitted by her physician.        PT Education - 08/09/21 1521     Education Details Lymphedema risk reduction and post op shoulder HEP    Methods Explanation;Demonstration;Handout    Comprehension Returned demonstration;Verbalized understanding                 PT Long Term Goals - 08/09/21 1525       PT LONG TERM GOAL #1   Title Patient will demonstrate she has regained full shoulder ROM and function post operatively compared to baselines.    Time 8    Period Weeks    Status New    Target Date 10/04/21             Breast Clinic Goals - 08/09/21 1525       Patient will be able to verbalize understanding of pertinent lymphedema risk reduction practices relevant to her diagnosis specifically related to skin care.   Time 1    Period Days    Status Achieved      Patient will be able to return demonstrate and/or verbalize understanding of the post-op home exercise program related to regaining shoulder range of motion.   Time 1    Period Days    Status Achieved      Patient will be able to verbalize understanding of the  importance of attending the postoperative After Breast Cancer Class for further lymphedema risk reduction education and therapeutic exercise.   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 It measures 1.8 cm and is located in the lower outer quadrant. It is ER positive, PR negative, and HER2 negative with a Ki67 of 50%. She had right hand surgery 40 years ago with hardware placed but it has since been removed. Her multidisciplinary medical team met prior to her assessments to determine a recommended treatment plan. She is planning to have a left lumpectomy and sentinel node biopsy followed by radiation and anti-estrogen therapy. She will benefit from a post op PT reassessment to determine needs and from L-Dex screens every 3 months for 2 years to detect subclinical lymphedema.    Stability/Clinical Decision Making Stable/Uncomplicated  Clinical Decision Making Low    Rehab Potential Excellent    PT Frequency --   Eval and 1 f/u visit   PT Treatment/Interventions ADLs/Self Care Home Management;Therapeutic exercise;Patient/family education    PT Next Visit Plan Will reassess 3-4 weeks post op    PT Home Exercise Plan Post op shoulder HEP    Consulted and Agree with Plan of Care Patient;Family member/caregiver    Family Member Consulted Husband             Patient will benefit from skilled therapeutic intervention in order to improve the following deficits and impairments:  Postural dysfunction, Decreased range of motion, Decreased knowledge of precautions, Impaired UE functional use, Pain  Visit Diagnosis: Malignant neoplasm of upper-outer quadrant of left breast in female, estrogen receptor positive ([place]) - Plan: PT plan of care cert/re-cert  Abnormal posture - Plan: PT plan of care  cert/re-cert   Patient will follow up at outpatient cancer rehab 3-4 weeks following surgery.  If the patient requires physical therapy at that time, a specific plan will be dictated and sent to the referring physician for approval. The patient was educated today on appropriate basic range of motion exercises to begin post operatively and the importance of attending the After Breast Cancer class following surgery.  Patient was educated today on lymphedema risk reduction practices as it pertains to recommendations that will benefit the patient immediately following surgery.  She verbalized good understanding.     Problem List Patient Active Problem List   Diagnosis Date Noted   Malignant neoplasm of lower-outer quadrant of left breast of female, estrogen receptor positive ([place]) 08/03/2021   Multiple sclerosis ([place]) 07/28/2012   Annia Friendly, PT 08/09/21 3:28 PM   [place], [place], [address] Phone: [phone]   Fax:  [phone]  Name: NADINA FOMBY MRN: [id number] Date of Birth: 1956/10/07

## 2021-08-10 ENCOUNTER — Encounter: Payer: Self-pay | Admitting: Genetic Counselor

## 2021-08-10 DIAGNOSIS — Z803 Family history of malignant neoplasm of breast: Secondary | ICD-10-CM

## 2021-08-10 DIAGNOSIS — Z8042 Family history of malignant neoplasm of prostate: Secondary | ICD-10-CM

## 2021-08-10 HISTORY — DX: Family history of malignant neoplasm of breast: Z80.3

## 2021-08-10 HISTORY — DX: Family history of malignant neoplasm of prostate: Z80.42

## 2021-08-10 NOTE — Progress Notes (Signed)
REFERRING PROVIDER: Nicholas Lose, MD [place],  [place] [address]  PRIMARY PROVIDER:  [name], MD  PRIMARY REASON FOR VISIT:  1. Malignant neoplasm of lower-outer quadrant of left breast of female, estrogen receptor positive ([place])   2. Family history of breast cancer   3. Family history of prostate cancer     HISTORY OF PRESENT ILLNESS:   Joyce Bennett, a 65 y.o. female, was seen for a [place] cancer genetics consultation during the breast multidisciplinary clinic at the request of Dr. Lindi Adie due to a personal and family history of cancer.  [name] presents to clinic today to discuss the possibility of a hereditary predisposition to cancer, to discuss genetic testing, and to further clarify her future cancer risks, as well as potential cancer risks for family members.   In August 2022, at the age of 57, [name] was diagnosed with left breast cancer (ER+/PR-/HER2-).  The preliminary treatment plan includes breast conserving surgery, Oncotype to determine potential benefit of chemotherapy, adjuvant radiation, and adjuvant anti-estrogens.   CANCER HISTORY:  Oncology History  Malignant neoplasm of lower-outer quadrant of left breast of female, estrogen receptor positive ([place])  08/03/2021 Initial Diagnosis   Left diagnostic mammogram: suspicious calcifications in the lower outer quadrant of the left breast. 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    08/09/2021 Cancer Staging   Staging form: Breast, AJCC 8th Edition - Clinical stage from 08/09/2021: Stage IA (cT60m, cN0, cM0, G2, ER+, PR-, HER2-) - Signed by GNicholas Lose MD on 08/09/2021 Stage prefix: Initial diagnosis Histologic grading system: 3 grade system      RISK FACTORS:  Menarche was at age 65  First live birth at age 65  OCP use: yes; unknown number of years Ovaries intact: yes.  Hysterectomy: no.  Menopausal status:  postmenopausal.  Colonoscopy: yes;  most recent in 2014 . Mammogram within the last year: yes. Up to date with pelvic exams: yes; most recent in July 2022  Past Medical History:  Diagnosis Date   Breast cancer ([facility]    Family history of breast cancer 08/10/2021   Family history of prostate cancer 08/10/2021   Multiple sclerosis ([place]    Neurogenic bladder    Optic neuritis, left    Restless leg syndrome    Urinary tract infection    Recurrent    Past Surgical History:  Procedure Laterality Date   FOOT SURGERY N/A    HAND SURGERY Right     Social History   Socioeconomic History   Marital status: Married    Spouse name: Not on file   Number of children: 1   Years of education: ASSOCIATE   Highest education level: Not on file  Occupational History   Occupation: BInternational aid/development worker [employer] Tobacco Use   Smoking status: Never   Smokeless tobacco: Never  Substance and Sexual Activity   Alcohol use: Yes    Comment: rare   Drug use: No   Sexual activity: Not on file  Other Topics Concern   Not on file  Social History Narrative   Patient is married with 1 child.   Patient is right handed.   Patient has a Associate's degree.   Patient drinks 5 cups daily.   Social Determinants of Health   Financial Resource Strain: Not on file  Food Insecurity: Not on file  Transportation Needs: Not on file  Physical Activity: Not on file  Stress:  Not on file  Social Connections: Not on file     FAMILY HISTORY:  We obtained a detailed, 4-generation family history.  Significant diagnoses are listed below: Family History  Problem Relation Age of Onset   Stomach cancer Sister        dx 4s   Breast cancer Maternal Aunt        dx after 24, x2 maternal aunts   Prostate cancer Maternal Uncle        dx 59s   Breast cancer Paternal Grandmother        dx after 20    Ms. Fredman is unaware of previous family history of genetic testing for hereditary cancer  risks. There is no reported Ashkenazi Jewish ancestry. There is no known consanguinity.  GENETIC COUNSELING ASSESSMENT: [name] is a 65 y.o. female with a personal and family history of cancer which is somewhat suggestive of a hereditary cancer syndrome and predisposition to cancer given the presence of related cancers in the family. We, therefore, discussed and recommended the following at today's visit.   DISCUSSION: We discussed that 5 - 10% of cancer is hereditary, with most cases of hereditary breast cancer associated with mutations in BRCA1/2.  There are other genes that can be associated with hereditary breast and prostate cancer syndromes.  Type of cancer risk and level of risk are gene-specific. We discussed that testing is beneficial for several reasons including knowing how to follow individuals after completing their treatment, identifying whether potential treatment options would be beneficial, and understanding if other family members could be at risk for cancer and allowing them to undergo genetic testing.   We reviewed the characteristics, features and inheritance patterns of hereditary cancer syndromes. We also discussed genetic testing, including the appropriate family members to test, the process of testing, insurance coverage and turn-around-time for results. We discussed the implications of a negative, positive and/or variant of uncertain significant result. In order to get genetic test results in a timely manner so that Ms. Daffin can use these genetic test results for surgical decisions, we recommended Ms. Langwell pursue genetic testing for the [employer].  The BRCAplus panel offered by [employer] and includes sequencing and deletion/duplication analysis for the following 8 genes: ATM, BRCA1, BRCA2, CDH1, CHEK2, PALB2, PTEN, and TP53.  Once complete, we recommend Ms. Goers pursue reflex genetic testing to a more comprehensive gene panel.   The  CancerNext-Expanded gene panel offered by [facility] and includes sequencing, rearrangement, and RNA analysis for the following 77 genes: AIP, ALK, APC, ATM, AXIN2, BAP1, BARD1, BLM, BMPR1A, BRCA1, BRCA2, BRIP1, CDC73, CDH1, CDK4, CDKN1B, CDKN2A, CHEK2, CTNNA1, DICER1, FANCC, FH, FLCN, GALNT12, KIF1B, LZTR1, MAX, MEN1, MET, MLH1, MSH2, MSH3, MSH6, MUTYH, NBN, NF1, NF2, NTHL1, PALB2, PHOX2B, PMS2, POT1, PRKAR1A, PTCH1, PTEN, RAD51C, RAD51D, RB1, RECQL, RET, SDHA, SDHAF2, SDHB, SDHC, SDHD, SMAD4, SMARCA4, SMARCB1, SMARCE1, STK11, SUFU, TMEM127, TP53, TSC1, TSC2, VHL and XRCC2 (sequencing and deletion/duplication); EGFR, EGLN1, HOXB13, KIT, MITF, PDGFRA, POLD1, and POLE (sequencing only); EPCAM and GREM1 (deletion/duplication only).   Based on Ms. Beigel's personal and family history of cancer, she meets medical criteria for genetic testing. Despite that she meets criteria, she may still have an out of pocket cost. We discussed that if her out of pocket cost for testing is over $100, the laboratory should contact her to discuss self-pay prices, patient pay assistance programs, if applicable, and other billing options.   PLAN: After considering the risks, benefits, and limitations, Ms. Roderic Scarce  provided informed consent to pursue genetic testing and the blood sample was sent to [facility] for analysis of the [facility] and CancerNext-Expanded +RNAinsight Panel. Results should be available within approximately 1-2 weeks' time, at which point they will be disclosed by telephone to Ms. Zirbel, as will any additional recommendations warranted by these results. Ms. Skates will receive a summary of her genetic counseling visit and a copy of her results once available. This information will also be available in Epic.   Lastly, we encouraged Ms. Birchmeier to remain in contact with cancer genetics annually so that we can continuously update the family history and inform her of any changes in cancer  genetics and testing that may be of benefit for this family.   Ms. Hubbert's questions were answered to her satisfaction today. Our contact information was provided should additional questions or concerns arise. Thank you for the referral and allowing [place] to share in the care of your patient.   Jasdeep Kepner M. Joette Catching, [place], [facility] Genetic Counselor [email or web address] (P) [phone]  The patient was seen for a total of 20 minutes in face-to-face genetic counseling.  The patient was accompanied by  her husband. Drs. [name], Lindi Adie and/or Burr Medico were available to discuss this case as needed.  _______________________________________________________________________ For Office Staff:  Number of people involved in session: 2 Was an Intern/ student involved with case: no

## 2021-08-15 ENCOUNTER — Other Ambulatory Visit: Payer: Self-pay | Admitting: General Surgery

## 2021-08-15 DIAGNOSIS — Z170 Estrogen receptor positive status [ER+]: Secondary | ICD-10-CM

## 2021-08-15 DIAGNOSIS — C50512 Malignant neoplasm of lower-outer quadrant of left female breast: Secondary | ICD-10-CM

## 2021-08-17 ENCOUNTER — Telehealth: Payer: Self-pay | Admitting: *Deleted

## 2021-08-17 ENCOUNTER — Encounter: Payer: Self-pay | Admitting: *Deleted

## 2021-08-17 NOTE — Telephone Encounter (Signed)
Spoke to pt concerning Joyce Bennett from 8.31.22. Denies questions or concerns regarding dx or treatment care plan. Encourage pt to call with needs. Received verbal understanding.

## 2021-08-24 ENCOUNTER — Encounter (HOSPITAL_BASED_OUTPATIENT_CLINIC_OR_DEPARTMENT_OTHER): Payer: Self-pay | Admitting: General Surgery

## 2021-08-24 ENCOUNTER — Telehealth: Payer: Self-pay | Admitting: Genetic Counselor

## 2021-08-24 ENCOUNTER — Encounter: Payer: Self-pay | Admitting: Genetic Counselor

## 2021-08-24 ENCOUNTER — Other Ambulatory Visit: Payer: Self-pay

## 2021-08-24 DIAGNOSIS — Z1379 Encounter for other screening for genetic and chromosomal anomalies: Secondary | ICD-10-CM | POA: Insufficient documentation

## 2021-08-24 NOTE — Telephone Encounter (Signed)
Revealed negative genetic testing.  Discussed that we do not know why she has breast cancer or why there is cancer in the family. It could be familial, due to a different gene that we are not testing, or maybe our current technology may not be able to pick something up.  It will be important for her to keep in contact with genetics to keep up with whether additional testing may be needed.  Results of pan-cancer panel are pending.

## 2021-08-31 ENCOUNTER — Ambulatory Visit
Admission: RE | Admit: 2021-08-31 | Discharge: 2021-08-31 | Disposition: A | Discharge status: Home / Self Care | Payer: BC Managed Care – PPO | Source: Ambulatory Visit | Attending: General Surgery | Admitting: General Surgery

## 2021-08-31 ENCOUNTER — Ambulatory Visit: Payer: Self-pay | Admitting: Genetic Counselor

## 2021-08-31 ENCOUNTER — Other Ambulatory Visit: Payer: Self-pay

## 2021-08-31 DIAGNOSIS — C50512 Malignant neoplasm of lower-outer quadrant of left female breast: Secondary | ICD-10-CM

## 2021-08-31 DIAGNOSIS — Z1379 Encounter for other screening for genetic and chromosomal anomalies: Secondary | ICD-10-CM

## 2021-08-31 DIAGNOSIS — Z803 Family history of malignant neoplasm of breast: Secondary | ICD-10-CM

## 2021-08-31 DIAGNOSIS — Z8042 Family history of malignant neoplasm of prostate: Secondary | ICD-10-CM

## 2021-08-31 NOTE — Progress Notes (Signed)

## 2021-08-31 NOTE — Progress Notes (Signed)
HPI:  Ms. Pondexter was previously seen in the [place] clinic due to a personal and family history of cancer and concerns regarding a hereditary predisposition to cancer. Please refer to our prior cancer genetics clinic note for more information regarding our discussion, assessment and recommendations, at the time. Ms. Forrey's recent genetic test results were disclosed to her, as were recommendations warranted by these results. These results and recommendations are discussed in more detail below.  CANCER HISTORY:  Oncology History  Malignant neoplasm of lower-outer quadrant of left breast of female, estrogen receptor positive ([place])  08/03/2021 Initial Diagnosis   Left diagnostic mammogram: suspicious calcifications in the lower outer quadrant of the left breast. 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    08/09/2021 Cancer Staging   Staging form: Breast, AJCC 8th Edition - Clinical stage from 08/09/2021: Stage IA (cT56mi, cN0, cM0, G2, ER+, PR-, HER2-) - Signed by Nicholas Lose, MD on 08/09/2021 Stage prefix: Initial diagnosis Histologic grading system: 3 grade system   08/18/2021 Genetic Testing   Negative hereditary cancer genetic testing: no pathogenic variants detected in [facility] BRCAPl[facility] Panel.  The report date is August 18, 2021.   The BRCAplus panel offered by [employer] and includes sequencing and deletion/duplication analysis for the following 8 genes: ATM, BRCA1, BRCA2, CDH1, CHEK2, PALB2, PTEN, and TP53.     FAMILY HISTORY:  We obtained a detailed, 4-generation family history.  Significant diagnoses are listed below: Family History  Problem Relation Age of Onset   Stomach cancer Sister        dx 50s   Breast cancer Maternal Aunt        dx after 10, x2 maternal aunts   Prostate cancer Maternal Uncle        dx 24s   Breast cancer Paternal Grandmother        dx after 11   Ms. Shimmel is unaware of previous  family history of genetic testing for hereditary cancer risks. There is no reported Ashkenazi Jewish ancestry. There is no known consanguinity.  GENETIC TEST RESULTS: Genetic testing reported out on September 16, 202. The CancerNext-Expanded +RNAinsight Panel found no pathogenic mutations. The CancerNext-Expanded gene panel offered by [facility] and includes sequencing, rearrangement, and RNA analysis for the following 77 genes: AIP, ALK, APC, ATM, AXIN2, BAP1, BARD1, BLM, BMPR1A, BRCA1, BRCA2, BRIP1, CDC73, CDH1, CDK4, CDKN1B, CDKN2A, CHEK2, CTNNA1, DICER1, FANCC, FH, FLCN, GALNT12, KIF1B, LZTR1, MAX, MEN1, MET, MLH1, MSH2, MSH3, MSH6, MUTYH, NBN, NF1, NF2, NTHL1, PALB2, PHOX2B, PMS2, POT1, PRKAR1A, PTCH1, PTEN, RAD51C, RAD51D, RB1, RECQL, RET, SDHA, SDHAF2, SDHB, SDHC, SDHD, SMAD4, SMARCA4, SMARCB1, SMARCE1, STK11, SUFU, TMEM127, TP53, TSC1, TSC2, VHL and XRCC2 (sequencing and deletion/duplication); EGFR, EGLN1, HOXB13, KIT, MITF, PDGFRA, POLD1, and POLE (sequencing only); EPCAM and GREM1 (deletion/duplication only).   The test report has been scanned into EPIC and is located under the Molecular Pathology section of the Results Review tab.  A portion of the result report is included below for reference.     We discussed with Ms. Sherman that because current genetic testing is not perfect, it is possible there may be a gene mutation in one of these genes that current testing cannot detect, but that chance is small.  We also discussed, that there could be another gene that has not yet been discovered, or that we have not yet tested, that is responsible for the cancer diagnoses in the family. It is also possible there is a hereditary cause  for the cancer in the family that Ms. Bogle did not inherit and therefore was not identified in her testing.  Therefore, it is important to remain in touch with cancer genetics in the future so that we can continue to offer Ms. Tonkinson the most up to date  genetic testing.   ADDITIONAL GENETIC TESTING: We discussed with Ms. Hehr that her genetic testing was fairly extensive.  If there are genes identified to increase cancer risk that can be analyzed in the future, we would be happy to discuss and coordinate this testing at that time.    CANCER SCREENING RECOMMENDATIONS: Ms. Storti's test result is considered negative (normal).  This means that we have not identified a hereditary cause for her personal history of cancer at this time. Most cancersare sporadic/familial, and this negative test suggests that her cancer may fall into this category.    While reassuring, this does not definitively rule out a hereditary predisposition to cancer. It is still possible that there could be genetic mutations that are undetectable by current technology. There could be genetic mutations in genes that have not been tested or identified to increase cancer risk.  Therefore, it is recommended she continue to follow the cancer management and screening guidelines provided by her oncology and primary healthcare provider.   An individual's cancer risk and medical management are not determined by genetic test results alone. Overall cancer risk assessment incorporates additional factors, including personal medical history, family history, and any available genetic information that may result in a personalized plan for cancer prevention and surveillance  RECOMMENDATIONS FOR FAMILY MEMBERS:  Individuals in this family might be at some increased risk of developing cancer, over the general population risk, simply due to the family history of cancer.  We recommended women in this family have a yearly mammogram beginning at age 42, or 94 years younger than the earliest onset of cancer, an annual clinical breast exam, and perform monthly breast self-exams. Women in this family should also have a gynecological exam as recommended by their primary provider. Family members should be  referred for colonoscopy starting at age 44, or earlier, as recommended by their providers.    It is also possible there is a hereditary cause for the cancer in Ms. Rocca's family that she did not inherit and therefore was not identified in her.  Based on Ms. Cartelli's family history, we recommended her maternal aunt, who was diagnosed with breast cancer, have genetic counseling and testing. Ms. Haggar will let us know if we can be of any assistance in coordinating genetic counseling and/or testing for this family member.   FOLLOW-UP: Lastly, we discussed with Ms. Potteiger that cancer genetics is a rapidly advancing field and it is possible that new genetic tests will be appropriate for her and/or her family members in the future. We encouraged her to remain in contact with cancer genetics on an annual basis so we can update her personal and family histories and let her know of advances in cancer genetics that may benefit this family.   Our contact number was provided. Ms. Kalan's questions were answered to her satisfaction, and she knows she is welcome to call us at anytime with additional questions or concerns.     Trevan Messman M. Joette Catching, [place], [facility] Genetic Counselor [email or web address] (P) [phone]

## 2021-08-31 NOTE — Telephone Encounter (Signed)
Revealed negative result of [facility] CancerNext-Expanded +RNAinsight Panel.

## 2021-09-01 ENCOUNTER — Ambulatory Visit
Admission: RE | Admit: 2021-09-01 | Discharge: 2021-09-01 | Disposition: A | Discharge status: Home / Self Care | Payer: BC Managed Care – PPO | Source: Ambulatory Visit | Attending: General Surgery | Admitting: General Surgery

## 2021-09-01 ENCOUNTER — Encounter (HOSPITAL_BASED_OUTPATIENT_CLINIC_OR_DEPARTMENT_OTHER): Payer: Self-pay | Admitting: General Surgery

## 2021-09-01 ENCOUNTER — Encounter (HOSPITAL_BASED_OUTPATIENT_CLINIC_OR_DEPARTMENT_OTHER)
Admission: RE | Disposition: A | Discharge status: Home / Self Care | Payer: Self-pay | Source: Home / Self Care | Attending: General Surgery

## 2021-09-01 ENCOUNTER — Ambulatory Visit (HOSPITAL_BASED_OUTPATIENT_CLINIC_OR_DEPARTMENT_OTHER): Payer: BC Managed Care – PPO | Admitting: Anesthesiology

## 2021-09-01 ENCOUNTER — Encounter (HOSPITAL_COMMUNITY)
Admission: RE | Admit: 2021-09-01 | Discharge: 2021-09-01 | Disposition: A | Discharge status: Home / Self Care | Payer: BC Managed Care – PPO | Source: Ambulatory Visit | Attending: General Surgery | Admitting: General Surgery

## 2021-09-01 ENCOUNTER — Ambulatory Visit (HOSPITAL_BASED_OUTPATIENT_CLINIC_OR_DEPARTMENT_OTHER)
Admission: RE | Admit: 2021-09-01 | Discharge: 2021-09-01 | Disposition: A | Discharge status: Home / Self Care | Payer: BC Managed Care – PPO | Attending: General Surgery | Admitting: General Surgery

## 2021-09-01 DIAGNOSIS — G35 Multiple sclerosis: Secondary | ICD-10-CM | POA: Insufficient documentation

## 2021-09-01 DIAGNOSIS — Z8042 Family history of malignant neoplasm of prostate: Secondary | ICD-10-CM | POA: Insufficient documentation

## 2021-09-01 DIAGNOSIS — Z8249 Family history of ischemic heart disease and other diseases of the circulatory system: Secondary | ICD-10-CM | POA: Diagnosis not present

## 2021-09-01 DIAGNOSIS — C50512 Malignant neoplasm of lower-outer quadrant of left female breast: Secondary | ICD-10-CM | POA: Insufficient documentation

## 2021-09-01 DIAGNOSIS — Z881 Allergy status to other antibiotic agents status: Secondary | ICD-10-CM | POA: Diagnosis not present

## 2021-09-01 DIAGNOSIS — Z170 Estrogen receptor positive status [ER+]: Secondary | ICD-10-CM

## 2021-09-01 DIAGNOSIS — D360 Benign neoplasm of lymph nodes: Secondary | ICD-10-CM | POA: Insufficient documentation

## 2021-09-01 DIAGNOSIS — Z803 Family history of malignant neoplasm of breast: Secondary | ICD-10-CM | POA: Diagnosis not present

## 2021-09-01 DIAGNOSIS — Z800 Family history of malignant neoplasm of digestive organs: Secondary | ICD-10-CM | POA: Diagnosis not present

## 2021-09-01 HISTORY — PX: BREAST LUMPECTOMY WITH RADIOACTIVE SEED AND SENTINEL LYMPH NODE BIOPSY: SHX6550

## 2021-09-01 SURGERY — BREAST LUMPECTOMY WITH RADIOACTIVE SEED AND SENTINEL LYMPH NODE BIOPSY
Anesthesia: General | Site: Breast | Laterality: Left

## 2021-09-01 MED ORDER — OXYCODONE HCL 5 MG/5ML PO SOLN: 5.0000 mg | Freq: Once | ORAL | Status: DC | PRN

## 2021-09-01 MED ORDER — PHENYLEPHRINE 40 MCG/ML (10ML) SYRINGE FOR IV PUSH (FOR BLOOD PRESSURE SUPPORT)
PREFILLED_SYRINGE | INTRAVENOUS | Status: DC | PRN
  Administered 2021-09-01: 80 ug via INTRAVENOUS

## 2021-09-01 MED ORDER — PROPOFOL 10 MG/ML IV BOLUS
INTRAVENOUS | Status: DC | PRN
  Administered 2021-09-01: 300 mg via INTRAVENOUS

## 2021-09-01 MED ORDER — CELECOXIB 200 MG PO CAPS
ORAL_CAPSULE | ORAL | Status: AC
  Filled 2021-09-01: qty 1

## 2021-09-01 MED ORDER — MIDAZOLAM HCL 2 MG/2ML IJ SOLN: 0.5000 mg | Freq: Once | INTRAMUSCULAR | Status: DC | PRN

## 2021-09-01 MED ORDER — ONDANSETRON HCL 4 MG/2ML IJ SOLN
INTRAMUSCULAR | Status: DC | PRN
  Administered 2021-09-01: 4 mg via INTRAVENOUS

## 2021-09-01 MED ORDER — MEPERIDINE HCL 25 MG/ML IJ SOLN: 6.2500 mg | INTRAMUSCULAR | Status: DC | PRN

## 2021-09-01 MED ORDER — VANCOMYCIN HCL IN DEXTROSE 1-5 GM/200ML-% IV SOLN
INTRAVENOUS | Status: AC
  Filled 2021-09-01: qty 200

## 2021-09-01 MED ORDER — LACTATED RINGERS IV SOLN: INTRAVENOUS | Status: DC

## 2021-09-01 MED ORDER — FENTANYL CITRATE (PF) 100 MCG/2ML IJ SOLN
INTRAMUSCULAR | Status: AC
  Filled 2021-09-01: qty 2

## 2021-09-01 MED ORDER — BUPIVACAINE-EPINEPHRINE (PF) 0.5% -1:200000 IJ SOLN
INTRAMUSCULAR | Status: DC | PRN
  Administered 2021-09-01: 30 mL

## 2021-09-01 MED ORDER — SCOPOLAMINE 1 MG/3DAYS TD PT72
1.0000 | MEDICATED_PATCH | TRANSDERMAL | Status: DC
  Administered 2021-09-01: 1.5 mg via TRANSDERMAL

## 2021-09-01 MED ORDER — SCOPOLAMINE 1 MG/3DAYS TD PT72
MEDICATED_PATCH | TRANSDERMAL | Status: AC
  Filled 2021-09-01: qty 1

## 2021-09-01 MED ORDER — CHLORHEXIDINE GLUCONATE CLOTH 2 % EX PADS: 6.0000 | MEDICATED_PAD | Freq: Once | CUTANEOUS | Status: DC

## 2021-09-01 MED ORDER — FENTANYL CITRATE (PF) 100 MCG/2ML IJ SOLN
100.0000 ug | Freq: Once | INTRAMUSCULAR | Status: AC
  Administered 2021-09-01: 100 ug via INTRAVENOUS

## 2021-09-01 MED ORDER — HYDROCODONE-ACETAMINOPHEN 5-325 MG PO TABS: 1.0000 | ORAL_TABLET | Freq: Four times a day (QID) | ORAL | 0 refills | Status: DC | PRN

## 2021-09-01 MED ORDER — GABAPENTIN 300 MG PO CAPS
ORAL_CAPSULE | ORAL | Status: AC
  Filled 2021-09-01: qty 1

## 2021-09-01 MED ORDER — MIDAZOLAM HCL 2 MG/2ML IJ SOLN
2.0000 mg | Freq: Once | INTRAMUSCULAR | Status: AC
  Administered 2021-09-01: 2 mg via INTRAVENOUS

## 2021-09-01 MED ORDER — VANCOMYCIN HCL IN DEXTROSE 1-5 GM/200ML-% IV SOLN
1000.0000 mg | INTRAVENOUS | Status: AC
  Administered 2021-09-01: 1000 mg via INTRAVENOUS

## 2021-09-01 MED ORDER — OXYCODONE HCL 5 MG PO TABS: 5.0000 mg | ORAL_TABLET | Freq: Once | ORAL | Status: DC | PRN

## 2021-09-01 MED ORDER — PROMETHAZINE HCL 25 MG/ML IJ SOLN: 6.2500 mg | INTRAMUSCULAR | Status: DC | PRN

## 2021-09-01 MED ORDER — HYDROMORPHONE HCL 1 MG/ML IJ SOLN: 0.2500 mg | INTRAMUSCULAR | Status: DC | PRN

## 2021-09-01 MED ORDER — CELECOXIB 200 MG PO CAPS
200.0000 mg | ORAL_CAPSULE | ORAL | Status: AC
  Administered 2021-09-01: 200 mg via ORAL

## 2021-09-01 MED ORDER — TECHNETIUM TC 99M TILMANOCEPT KIT
1.0000 | PACK | Freq: Once | INTRAVENOUS | Status: AC | PRN
  Administered 2021-09-01: 1 via INTRADERMAL

## 2021-09-01 MED ORDER — FENTANYL CITRATE (PF) 100 MCG/2ML IJ SOLN
INTRAMUSCULAR | Status: DC | PRN
  Administered 2021-09-01: 100 ug via INTRAVENOUS

## 2021-09-01 MED ORDER — MIDAZOLAM HCL 2 MG/2ML IJ SOLN
INTRAMUSCULAR | Status: AC
  Filled 2021-09-01: qty 2

## 2021-09-01 MED ORDER — DEXAMETHASONE SODIUM PHOSPHATE 4 MG/ML IJ SOLN
INTRAMUSCULAR | Status: DC | PRN
  Administered 2021-09-01: 5 mg via INTRAVENOUS

## 2021-09-01 MED ORDER — ACETAMINOPHEN 500 MG PO TABS
1000.0000 mg | ORAL_TABLET | ORAL | Status: AC
  Administered 2021-09-01: 1000 mg via ORAL

## 2021-09-01 MED ORDER — LIDOCAINE HCL (CARDIAC) PF 100 MG/5ML IV SOSY
PREFILLED_SYRINGE | INTRAVENOUS | Status: DC | PRN
  Administered 2021-09-01: 20 mg via INTRAVENOUS

## 2021-09-01 MED ORDER — ACETAMINOPHEN 500 MG PO TABS
ORAL_TABLET | ORAL | Status: AC
  Filled 2021-09-01: qty 2

## 2021-09-01 MED ORDER — GABAPENTIN 300 MG PO CAPS
300.0000 mg | ORAL_CAPSULE | ORAL | Status: AC
  Administered 2021-09-01: 300 mg via ORAL

## 2021-09-01 MED ORDER — BUPIVACAINE-EPINEPHRINE 0.25% -1:200000 IJ SOLN
INTRAMUSCULAR | Status: DC | PRN
  Administered 2021-09-01: 25 mL

## 2021-09-01 SURGICAL SUPPLY — 43 items
ADH SKN CLS APL DERMABOND .7 (GAUZE/BANDAGES/DRESSINGS) ×1
APL PRP STRL LF DISP 70% ISPRP (MISCELLANEOUS) ×1
APPLIER CLIP 9.375 MED OPEN (MISCELLANEOUS) ×2
APR CLP MED 9.3 20 MLT OPN (MISCELLANEOUS) ×1
BINDER BREAST XLRG (GAUZE/BANDAGES/DRESSINGS) ×2 IMPLANT
BLADE SURG 15 STRL LF DISP TIS (BLADE) ×1 IMPLANT
BLADE SURG 15 STRL SS (BLADE) ×2
CANISTER SUC SOCK COL 7IN (MISCELLANEOUS) IMPLANT
CANISTER SUCT 1200ML W/VALVE (MISCELLANEOUS) IMPLANT
CHLORAPREP W/TINT 26 (MISCELLANEOUS) ×2 IMPLANT
CLIP APPLIE 9.375 MED OPEN (MISCELLANEOUS) ×1 IMPLANT
COVER BACK TABLE 60X90IN (DRAPES) ×2 IMPLANT
COVER MAYO STAND STRL (DRAPES) ×2 IMPLANT
COVER PROBE W GEL 5X96 (DRAPES) ×2 IMPLANT
DERMABOND ADVANCED (GAUZE/BANDAGES/DRESSINGS) ×1
DERMABOND ADVANCED .7 DNX12 (GAUZE/BANDAGES/DRESSINGS) ×1 IMPLANT
DRAPE LAPAROSCOPIC ABDOMINAL (DRAPES) ×2 IMPLANT
DRAPE UTILITY XL STRL (DRAPES) ×2 IMPLANT
ELECT COATED BLADE 2.86 ST (ELECTRODE) ×2 IMPLANT
ELECT REM PT RETURN 9FT ADLT (ELECTROSURGICAL) ×2
ELECTRODE REM PT RTRN 9FT ADLT (ELECTROSURGICAL) ×1 IMPLANT
GLOVE SURG ENC MOIS LTX SZ7.5 (GLOVE) ×2 IMPLANT
GLOVE SURG POLYISO LF SZ7 (GLOVE) ×2 IMPLANT
GLOVE SURG POLYISO LF SZ8 (GLOVE) ×2 IMPLANT
GLOVE SURG UNDER POLY LF SZ7 (GLOVE) ×2 IMPLANT
GOWN STRL REUS W/ TWL LRG LVL3 (GOWN DISPOSABLE) ×2 IMPLANT
GOWN STRL REUS W/TWL 2XL LVL3 (GOWN DISPOSABLE) ×2 IMPLANT
GOWN STRL REUS W/TWL LRG LVL3 (GOWN DISPOSABLE) ×4
KIT MARKER MARGIN INK (KITS) ×2 IMPLANT
NEEDLE HYPO 25X1 1.5 SAFETY (NEEDLE) ×2 IMPLANT
NS IRRIG 1000ML POUR BTL (IV SOLUTION) IMPLANT
PACK BASIN DAY SURGERY FS (CUSTOM PROCEDURE TRAY) ×2 IMPLANT
PENCIL SMOKE EVACUATOR (MISCELLANEOUS) ×2 IMPLANT
SLEEVE SCD COMPRESS KNEE MED (STOCKING) ×2 IMPLANT
SPONGE T-LAP 18X18 ~~LOC~~+RFID (SPONGE) ×2 IMPLANT
SUT MON AB 4-0 PC3 18 (SUTURE) ×4 IMPLANT
SUT SILK 2 0 SH (SUTURE) IMPLANT
SUT VICRYL 3-0 CR8 SH (SUTURE) ×2 IMPLANT
SYR CONTROL 10ML LL (SYRINGE) ×2 IMPLANT
TOWEL GREEN STERILE FF (TOWEL DISPOSABLE) ×2 IMPLANT
TRAY FAXITRON CT DISP (TRAY / TRAY PROCEDURE) ×2 IMPLANT
TUBE CONNECTING 20X1/4 (TUBING) ×2 IMPLANT
YANKAUER SUCT BULB TIP NO VENT (SUCTIONS) ×2 IMPLANT

## 2021-09-01 NOTE — Progress Notes (Signed)
Nuc med injection performed by nuc med staff. Pt tol well with no additional sedation. VSS. Emotional support provided.

## 2021-09-01 NOTE — Op Note (Signed)
09/01/2021  12:18 PM  PATIENT:  Joyce Bennett  65 y.o. female  PRE-OPERATIVE DIAGNOSIS:  LEFT BREAST  CANCER  POST-OPERATIVE DIAGNOSIS:  LEFT BREAST  CANCER  PROCEDURE:  Procedure(s): LEFT BREAST LUMPECTOMY WITH RADIOACTIVE SEED LOCALIZATION AND DEEP LEFT AXILLARY SENTINEL LYMPH NODE BIOPSY (Left)  SURGEON:  Surgeon(s) and Role:    * Jovita Kussmaul, MD - Primary  PHYSICIAN ASSISTANT:   ASSISTANTS: none   ANESTHESIA:   local and general  EBL:  10 mL   BLOOD ADMINISTERED:none  DRAINS: none   LOCAL MEDICATIONS USED:  MARCAINE     SPECIMEN:  Source of Specimen:  left breast tissue and sentinel node  DISPOSITION OF SPECIMEN:  PATHOLOGY  COUNTS:  YES  TOURNIQUET:  * No tourniquets in log *  DICTATION: .Dragon Dictation  After informed consent was obtained the patient was brought to the operating room and placed in the supine position on the operating table.  After adequate induction of general anesthesia the patient's left chest, breast, and axillary area were prepped with ChloraPrep, allowed to dry, and draped in usual sterile manner.  An appropriate timeout was performed.  Previously an I-125 seed was placed in the lower aspect of the left breast to mark an area of invasive breast cancer.  Also earlier in the day the patient underwent injection of 1 mCi of technetium sulfur colloid in the subareolar position on the left.  The neoprobe was set to technetium and there was a good radioactive signal in the left axilla.  The area overlying this was infiltrated with quarter percent Marcaine.  A small transversely oriented incision was made with a 15 blade knife overlying the area of radioactivity.  The incision was carried through the skin and subcutaneous tissue sharply with the electrocautery until the deep left axillary space was entered.  Using the neoprobe I was able to identify a hot lymph node.  This was excised sharply with the electrocautery and the surrounding small vessels  and lymphatics were controlled with clips.  Ex vivo counts on this node were approximately 800.  No other hot or palpable nodes were identified in the left axilla.  Hemostasis was achieved using the Bovie electrocautery.  The deep layer of the incision was closed with interrupted 3-0 Vicryl stitches.  The skin was then closed with a running 4-0 Monocryl subcuticular stitch.  Attention was then turned to the left breast.  The neoprobe was set to I-125 in the area of radioactivity was readily identified.  The area around this was infiltrated with quarter percent Marcaine.  A curvilinear incision was made along the lower edge of the areola of the left breast with a 15 blade knife.  The incision was carried through the skin and subcutaneous tissue sharply with the electrocautery.  Dissection was then carried throughout a good portion of the lower part of the breast between the breast tissue and the subcutaneous fat and skin.  The skin flap was very thin overlying the area of the cancer.  Once the dissection was well beyond the area of the cancer I then removed a circular portion of breast tissue sharply with the electrocautery around the radioactive seed while checking the area of radioactivity frequently.  Once the specimen was removed it was oriented with the appropriate paint colors.  A specimen radiograph was obtained that showed the clip and seed to be near the center of the specimen.  The specimen was then sent to pathology for further evaluation.  Hemostasis was  achieved using the Bovie electrocautery.  The cavity was marked with clips.  The wound was irrigated with saline and infiltrated with more quarter percent Marcaine.  The deep layer of the wound was then closed with interrupted 3-0 Vicryl stitches.  The skin was then closed with interrupted 4-0 Monocryl subcuticular stitches.  Dermabond dressings were applied.  The patient tolerated the procedure well.  At the end of the case all needle sponge and  instrument counts were correct.  The patient was then awakened and taken to recovery in stable condition.  PLAN OF CARE: Discharge to home after PACU  PATIENT DISPOSITION:  PACU - hemodynamically stable.   Delay start of Pharmacological VTE agent (>24hrs) due to surgical blood loss or risk of bleeding: not applicable

## 2021-09-01 NOTE — Interval H&P Note (Signed)
History and Physical Interval Note:  09/01/2021 10:50 AM  Joyce Bennett Signs  has presented today for surgery, with the diagnosis of LEFT BREAST  CANCER.  The various methods of treatment have been discussed with the patient and family. After consideration of risks, benefits and other options for treatment, the patient has consented to  Procedure(s): LEFT BREAST LUMPECTOMY WITH RADIOACTIVE SEED AND SENTINEL LYMPH NODE BIOPSY (Left) as a surgical intervention.  The patient's history has been reviewed, patient examined, no change in status, stable for surgery.  I have reviewed the patient's chart and labs.  Questions were answered to the patient's satisfaction.     [name]

## 2021-09-01 NOTE — Transfer of Care (Signed)
Immediate Anesthesia Transfer of Care Note  Patient: NATAKI MCCRUMB  Procedure(s) Performed: LEFT BREAST LUMPECTOMY WITH RADIOACTIVE SEED AND SENTINEL LYMPH NODE BIOPSY (Left: Breast)  Patient Location: PACU  Anesthesia Type:GA combined with regional for post-op pain  Level of Consciousness: awake  Airway & Oxygen Therapy: Patient Spontanous Breathing and Patient connected to face mask oxygen  Post-op Assessment: Report given to RN and Post -op Vital signs reviewed and stable  Post vital signs: Reviewed and stable  Last Vitals:  Vitals Value Taken Time  BP    Temp    Pulse 107 09/01/21 1223  Resp 16 09/01/21 1223  SpO2 100 % 09/01/21 1223  Vitals shown include unvalidated device data.  Last Pain:  Vitals:   09/01/21 0915  TempSrc: Oral  PainSc: 0-No pain      Patients Stated Pain Goal: 4 (27/61/47 0929)  Complications: No notable events documented.

## 2021-09-01 NOTE — H&P (Signed)
PROVIDER: Landry Corporal, MD  MRN: [id number] DOB: 06-06-1956   Subjective   Chief Complaint: Breast Cancer   History of Present Illness: Joyce Bennett is a 65 y.o. female who is seen today as an office consultation at the request of Dr. Pasty Arch for evaluation of Breast Cancer .   We are asked to see the patient in consultation by Dr. Lindi Adie to evaluate her for a new left breast cancer. The patient is a 65 year old black female who recently went for routine screening mammogram. At that time she was found to have calcifications in the 6 o'clock position of the left breast that covered an area of 1.8 cm. The axilla looked negative. The calcifications were biopsied and came back as mostly DCIS with evidence of microinvasion. The cancer was ER and PR positive and HER2 negative with a Ki-67 of 50%. She does not smoke. She does have a family history of breast cancer in a maternal aunt and a paternal grandmother. There is also a prostate cancer in an uncle and stomach cancer in a sister.  Review of Systems: A complete review of systems was obtained from the patient. I have reviewed this information and discussed as appropriate with the patient. See HPI as well for other ROS.  ROS   Medical History: Past Medical History:  Diagnosis Date   History of cancer   Multiple sclerosis (CMS-HCC)   Neurogenic bladder   Patient Active Problem List  Diagnosis   Malignant neoplasm of lower-outer quadrant of left breast of female, estrogen receptor positive (CMS-HCC)   Multiple sclerosis (CMS-HCC)   Past Surgical History:  Procedure Laterality Date   Foot surgery N/A  Date Unknown   Hand Surgery Right  Date Unknown    Allergies  Allergen Reactions   Cephalexin Hives   Shellfish Containing Products Swelling   Current Outpatient Medications on File Prior to Visit  Medication Sig Dispense Refill   interferon beta-1b (BETASERON) 0.3 mg injection Betaseron 0.3 mg subcutaneous kit    terbinafine HCL (LAMISIL) 250 mg tablet Take by mouth   cholecalciferol (VITAMIN D3) 1000 unit tablet Take by mouth   estradioL (ESTRACE) 0.01 % (0.1 mg/gram) vaginal cream estradiol 0.01% (0.1 mg/gram) vaginal cream INSERT 1 GRAM VAGINALLY TWICE A WEEK AS NEEDED   multivitamin (MULTIVITAMIN) tablet Take 1 tablet by mouth once daily   trimethoprim 100 mg tablet Take by mouth   No current facility-administered medications on file prior to visit.   Family History  Problem Relation Age of Onset   Liver cancer Sister   Heart disease Maternal Grandmother   Cancer Paternal Grandmother    Social History   Tobacco Use  Smoking Status Never Smoker  Smokeless Tobacco Never Used    Social History   Socioeconomic History   Marital status: Unknown  Tobacco Use   Smoking status: Never Smoker   Smokeless tobacco: Never Used  Scientific laboratory technician Use: Never used  Substance and Sexual Activity   Alcohol use: Yes  Comment: "Occasionlly"   Drug use: Never   Objective:   There were no vitals filed for this visit.  There is no height or weight on file to calculate BMI.  Physical Exam Vitals reviewed.  Constitutional:  General: She is not in acute distress. Appearance: Normal appearance.  HENT:  Head: Normocephalic and atraumatic.  Right Ear: External ear normal.  Left Ear: External ear normal.  Nose: Nose normal.  Mouth/Throat:  Mouth: Mucous membranes are moist.  Pharynx: Oropharynx is  clear.  Eyes:  General: No scleral icterus. Extraocular Movements: Extraocular movements intact.  Conjunctiva/sclera: Conjunctivae normal.  Pupils: Pupils are equal, round, and reactive to light.  Cardiovascular:  Rate and Rhythm: Normal rate and regular rhythm.  Pulses: Normal pulses.  Heart sounds: Normal heart sounds.  Pulmonary:  Effort: Pulmonary effort is normal. No respiratory distress.  Breath sounds: Normal breath sounds.  Abdominal:  General: Bowel sounds are normal.   Palpations: Abdomen is soft.  Tenderness: There is no abdominal tenderness.  Musculoskeletal:  General: No swelling, tenderness or deformity. Normal range of motion.  Cervical back: Normal range of motion and neck supple.  Skin: General: Skin is warm and dry.  Coloration: Skin is not jaundiced.  Neurological:  General: No focal deficit present.  Mental Status: She is alert and oriented to person, place, and time.  Psychiatric:  Mood and Affect: Mood normal.  Behavior: Behavior normal.   Breast: There is no palpable mass in either breast. There is no palpable axillary, supraclavicular, or cervical lymphadenopathy.  Labs, Imaging and Diagnostic Testing:  Assessment and Plan:  Diagnoses and all orders for this visit:  Malignant neoplasm of lower-outer quadrant of left breast of female, estrogen receptor positive (CMS-HCC) - [facility] Case Posting Request; Future    The patient appears to have a 1.8 cm cancer in the lower outer quadrant of the left breast with clinically negative nodes. I have discussed with her in detail the different options for treatment and at this point she favors breast conservation which I feel is very reasonable. I have discussed with her in detail the risks and benefits of the operation as well as some of the technical aspects including the use of a radioactive seed for localization and she understands and wishes to proceed. She will meet with medical and radiation oncology to discuss adjuvant therapy. She will also undergo preoperative lymphedema testing.

## 2021-09-01 NOTE — Anesthesia Procedure Notes (Signed)
Procedure Name: LMA Insertion Date/Time: 09/01/2021 11:18 AM Performed by: Tawni Millers, CRNA Pre-anesthesia Checklist: Patient identified, Emergency Drugs available, Suction available and Patient being monitored Patient Re-evaluated:Patient Re-evaluated prior to induction Oxygen Delivery Method: Circle system utilized Preoxygenation: Pre-oxygenation with 100% oxygen Induction Type: IV induction Ventilation: Mask ventilation without difficulty LMA: LMA inserted LMA Size: 3.0 Number of attempts: 1 Airway Equipment and Method: Bite block Placement Confirmation: positive ETCO2 Tube secured with: Tape Dental Injury: Teeth and Oropharynx as per pre-operative assessment

## 2021-09-01 NOTE — Progress Notes (Signed)
Assisted Dr. Carswell Jackson with left, ultrasound guided, pectoralis block. Side rails up, monitors on throughout procedure. See vital signs in flow sheet. Tolerated Procedure well. 

## 2021-09-01 NOTE — Anesthesia Procedure Notes (Signed)
Anesthesia Regional Block: Pectoralis block   Pre-Anesthetic Checklist: , timeout performed,  Correct Patient, Correct Site, Correct Laterality,  Correct Procedure, Correct Position, site marked,  Risks and benefits discussed,  Surgical consent,  Pre-op evaluation,  At surgeon's request and post-op pain management  Laterality: Left  Prep: chloraprep       Needles:  Injection technique: Single-shot  Needle Type: Echogenic Needle     Needle Length: 9cm  Needle Gauge: 21     Additional Needles:   Procedures:,,,, ultrasound used (permanent image in chart),,    Narrative:  Start time: 09/01/2021 9:59 AM End time: 09/01/2021 10:05 AM Injection made incrementally with aspirations every 5 mL.  Performed by: Personally  Anesthesiologist: Annye Asa, MD  Additional Notes: Pt identified in Holding room.  Monitors applied. Working IV access confirmed. Sterile prep L clavicle and pec.  #21ga ECHOgenic Arrow block needle between pec minor and serratus, between ribs 4,5 with US guidance.  30cc 0.5% Bupivacaine with 1:200k epi injected incrementally after negative test dose, great spread of local in tissue plane.  Patient asymptomatic, VSS, no heme aspirated, tolerated well. Jenita Seashore, MD

## 2021-09-01 NOTE — Anesthesia Preprocedure Evaluation (Addendum)
Anesthesia Evaluation  Patient identified by MRN, date of birth, ID band Patient awake    Reviewed: Allergy & Precautions, NPO status , Patient's Chart, lab work & pertinent test results  History of Anesthesia Complications Negative for: history of anesthetic complications  Airway Mallampati: I  TM Distance: >3 FB Neck ROM: Full    Dental  (+) Dental Advisory Given   Pulmonary neg pulmonary ROS,    breath sounds clear to auscultation       Cardiovascular negative cardio ROS   Rhythm:Regular Rate:Normal     Neuro/Psych MS: neurogenic bladder    GI/Hepatic negative GI ROS, Neg liver ROS,   Endo/Other  negative endocrine ROS  Renal/GU negative Renal ROS     Musculoskeletal   Abdominal   Peds  Hematology negative hematology ROS (+)   Anesthesia Other Findings Breast cancer  Reproductive/Obstetrics                            Anesthesia Physical Anesthesia Plan  ASA: 3  Anesthesia Plan: General   Post-op Pain Management: GA combined w/ Regional for post-op pain   Induction: Intravenous  PONV Risk Score and Plan: 3 and Ondansetron, Dexamethasone, Scopolamine patch - Pre-op and Treatment may vary due to age or medical condition  Airway Management Planned: LMA  Additional Equipment: None  Intra-op Plan:   Post-operative Plan:   Informed Consent: I have reviewed the patients History and Physical, chart, labs and discussed the procedure including the risks, benefits and alternatives for the proposed anesthesia with the patient or authorized representative who has indicated his/her understanding and acceptance.     Dental advisory given  Plan Discussed with: CRNA and Surgeon  Anesthesia Plan Comments: (Plan routine monitors, GA with pec block for post op analgesia)       Anesthesia Quick Evaluation

## 2021-09-01 NOTE — Discharge Instructions (Signed)
  Post Anesthesia Home Care Instructions  Activity: Get plenty of rest for the remainder of the day. A responsible individual must stay with you for 24 hours following the procedure.  For the next 24 hours, DO NOT: -Drive a car -Paediatric nurse -Drink alcoholic beverages -Take any medication unless instructed by your physician -Make any legal decisions or sign important papers.  Meals: Start with liquid foods such as gelatin or soup. Progress to regular foods as tolerated. Avoid greasy, spicy, heavy foods. If nausea and/or vomiting occur, drink only clear liquids until the nausea and/or vomiting subsides. Call your physician if vomiting continues.  Special Instructions/Symptoms: Your throat may feel dry or sore from the anesthesia or the breathing tube placed in your throat during surgery. If this causes discomfort, gargle with warm salt water. The discomfort should disappear within 24 hours.  If you had a scopolamine patch placed behind your ear for the management of post- operative nausea and/or vomiting:  1. The medication in the patch is effective for 72 hours, after which it should be removed.  Wrap patch in a tissue and discard in the trash. Wash hands thoroughly with soap and water. 2. You may remove the patch earlier than 72 hours if you experience unpleasant side effects which may include dry mouth, dizziness or visual disturbances. 3. Avoid touching the patch. Wash your hands with soap and water after contact with the patch.     Next dose of Tylenol/NSAIDs after 1:20pm for pain as needed.

## 2021-09-01 NOTE — Anesthesia Postprocedure Evaluation (Signed)
Anesthesia Post Note  Patient: Joyce Bennett  Procedure(s) Performed: LEFT BREAST LUMPECTOMY WITH RADIOACTIVE SEED AND SENTINEL LYMPH NODE BIOPSY (Left: Breast)     Patient location during evaluation: Phase II Anesthesia Type: General Level of consciousness: awake and alert, patient cooperative and oriented Pain management: pain level controlled Vital Signs Assessment: post-procedure vital signs reviewed and stable Respiratory status: spontaneous breathing, nonlabored ventilation and respiratory function stable Cardiovascular status: blood pressure returned to baseline and stable Postop Assessment: able to ambulate, adequate PO intake and no apparent nausea or vomiting Anesthetic complications: no   No notable events documented.  Last Vitals:  Vitals:   09/01/21 1252 09/01/21 1316  BP: 136/77 (!) 144/85  Pulse:  78  Resp:  16  Temp:  37 C  SpO2:  98%    Last Pain:  Vitals:   09/01/21 1316  TempSrc: Oral  PainSc:                  [name],[name]

## 2021-09-04 LAB — SURGICAL PATHOLOGY

## 2021-09-06 ENCOUNTER — Encounter: Payer: Self-pay | Admitting: *Deleted

## 2021-09-06 DIAGNOSIS — C50512 Malignant neoplasm of lower-outer quadrant of left female breast: Secondary | ICD-10-CM

## 2021-09-07 ENCOUNTER — Encounter (HOSPITAL_BASED_OUTPATIENT_CLINIC_OR_DEPARTMENT_OTHER): Payer: Self-pay | Admitting: General Surgery

## 2021-09-07 ENCOUNTER — Ambulatory Visit (HOSPITAL_BASED_OUTPATIENT_CLINIC_OR_DEPARTMENT_OTHER): Payer: BC Managed Care – PPO | Admitting: Hematology and Oncology

## 2021-09-07 ENCOUNTER — Encounter: Payer: Self-pay | Admitting: *Deleted

## 2021-09-07 DIAGNOSIS — Z170 Estrogen receptor positive status [ER+]: Secondary | ICD-10-CM | POA: Diagnosis not present

## 2021-09-07 DIAGNOSIS — C50512 Malignant neoplasm of lower-outer quadrant of left female breast: Secondary | ICD-10-CM

## 2021-09-07 NOTE — Assessment & Plan Note (Signed)
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 I discussed the margins as well as lymph node surgeries. We also discussed the final staging along with previously performed ER/PR and HER-2/neu testing.  Treatment plan: 1.  Given the small size of the invasive ductal carcinoma there is no role of Oncotype DX testing. 2. adjuvant radiation therapy followed by 3.  Adjuvant antiestrogens  Return to clinic at the end of radiation.

## 2021-09-07 NOTE — Progress Notes (Signed)
HEMATOLOGY-ONCOLOGY TELEPHONE VISIT PROGRESS NOTE  I connected with _0 @ on 09/07/21 at  3:30 PM EDT by telephone and verified that I am speaking with the correct person using two identifiers.  I discussed the limitations, risks, security and privacy concerns of performing an evaluation and management service by telephone and the availability of in person appointments.  I also discussed with the patient that there may be a patient responsible charge related to this service. The patient expressed understanding and agreed to proceed.   CHIEF COMPLIANT: Follow-up to discuss final pathology report  INTERVAL HISTORY: Joyce Bennett is a 65 year old with above-mentioned history left breast cancer treated with lumpectomy and today we are discussing the pathology report with her.  She is healing and recovering very well from recent surgery.  Final pathology showed no more evidence of invasive ductal carcinoma.  The final path is mostly DCIS.  Based on the biopsy material, the tumor size estimated to be 0.1 cm.  Oncology History  Malignant neoplasm of lower-outer quadrant of left breast of female, estrogen receptor positive ([place])  08/03/2021 Initial Diagnosis   Left diagnostic mammogram: suspicious calcifications in the lower outer quadrant of the left breast. 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    08/09/2021 Cancer Staging   Staging form: Breast, AJCC 8th Edition - Clinical stage from 08/09/2021: Stage IA (cT40m, cN0, cM0, G2, ER+, PR-, HER2-) - Signed by GNicholas Lose MD on 08/09/2021 Stage prefix: Initial diagnosis Histologic grading system: 3 grade system   08/18/2021 Genetic Testing   Negative hereditary cancer genetic testing: no pathogenic variants detected in [facility] BRCAPlus Panel or [facility] CancerNext-Expanded +RNAinsight Panel.  The report dates are August 18, 2021 and August 25, 2021, respectively.   The BRCAplus panel offered by [employer] includes sequencing and deletion/duplication analysis for the following 8 genes: ATM, BRCA1, BRCA2, CDH1, CHEK2, PALB2, PTEN, and TP53.  The CancerNext-Expanded gene panel offered by [facility] includes sequencing, rearrangement, and RNA analysis for the following 77 genes: AIP, ALK, APC, ATM, AXIN2, BAP1, BARD1, BLM, BMPR1A, BRCA1, BRCA2, BRIP1, CDC73, CDH1, CDK4, CDKN1B, CDKN2A, CHEK2, CTNNA1, DICER1, FANCC, FH, FLCN, GALNT12, KIF1B, LZTR1, MAX, MEN1, MET, MLH1, MSH2, MSH3, MSH6, MUTYH, NBN, NF1, NF2, NTHL1, PALB2, PHOX2B, PMS2, POT1, PRKAR1A, PTCH1, PTEN, RAD51C, RAD51D, RB1, RECQL, RET, SDHA, SDHAF2, SDHB, SDHC, SDHD, SMAD4, SMARCA4, SMARCB1, SMARCE1, STK11, SUFU, TMEM127, TP53, TSC1, TSC2, VHL and XRCC2 (sequencing and deletion/duplication); EGFR, EGLN1, HOXB13, KIT, MITF, PDGFRA, POLD1, and POLE (sequencing only); EPCAM and GREM1 (deletion/duplication only).    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 I discussed the margins as well as lymph node surgeries. We also discussed the final staging along with previously performed ER/PR and HER-2/neu testing.  Treatment  plan: 1.  Given the small  size of the invasive ductal carcinoma there is no role of Oncotype DX testing. 2. adjuvant radiation therapy followed by 3.  Adjuvant antiestrogens  Return to clinic at the end of radiation.   I discussed the assessment and treatment plan with the patient. The patient was provided an opportunity to ask questions and all were answered. The patient agreed with the plan and demonstrated an understanding of the instructions. The patient was advised to call back or seek an in-person evaluation if the symptoms worsen or if the condition fails to improve as anticipated.   I provided 12 minutes of non-face-to-face time during this encounter. Harriette Ohara, MD

## 2021-09-08 ENCOUNTER — Inpatient Hospital Stay: Payer: BC Managed Care – PPO | Admitting: Hematology and Oncology

## 2021-09-22 NOTE — Progress Notes (Signed)
Location of Breast Cancer:  lower outer quadrant of the left breast  Histology per Pathology Report:  intermediate to high-grade DCIS with calcifications  Receptor Status:  NEGATIVE for Her2 (1+). Estrogen Receptor: 95%, POSITIVE, STRONG STAINING INTENSITY Progesterone Receptor: 0%, NEGATIVE Proliferation Marker Ki67: 50%  Did patient present with symptoms (if so, please note symptoms) or was this found on screening mammography?: diagnostic mammogram on 07/19/2021 showed suspicious calcifications in the lower outer quadrant of the left breast  Past/Anticipated interventions by surgeon, if any:  Procedure(s): LEFT BREAST LUMPECTOMY WITH RADIOACTIVE SEED LOCALIZATION AND DEEP LEFT AXILLARY SENTINEL LYMPH NODE BIOPSY (Left)   SURGEON:  Surgeon(s) and Role:    Jovita Kussmaul, MD - Primary  Past/Anticipated interventions by medical oncology, if any: Chemotherapy Dr Lindi Adie Treatment plan: 1.  Given the small size of the invasive ductal carcinoma there is no role of Oncotype DX testing. 2. adjuvant radiation therapy followed by 3.  Adjuvant antiestrogens  Lymphedema issues, if any:  no    Pain issues, if any:  no   SAFETY ISSUES: Prior radiation? no Pacemaker/ICD? no Possible current pregnancy? no, postmenopausal Is the patient on methotrexate? no  Current Complaints / other details:  Swelling and intermittent sharp pain in left breast    Vitals:   09/28/21 1322  BP: 113/84  Pulse: 92  Resp: 20  Temp: 97.8 F (36.6 C)  SpO2: 100%  Weight: 175 lb 12.8 oz (79.7 kg)  Height: '5\' 3"'  (1.6 m)

## 2021-09-27 NOTE — Progress Notes (Signed)
Radiation Oncology         [phone] ________________________________  Name: Joyce Bennett MRN: [id number]  Date: 09/28/2021  DOB: Jan 05, 1956  Re-Evaluation Note  CC: [name], MD  [name], [name], MD    ICD-10-CM   1. Malignant neoplasm of lower-outer quadrant of left breast of female, estrogen receptor positive ([place])  C50.512    Z17.0       Diagnosis:   Status post left lumpectomy: Stage IA (cT51m, cN0, cM0) Left Breast LOQ, Focal-residual high-grade DCIS, with no residual invasive ductal carcinoma, ER+ / PR- / Her2-  Narrative:  The patient returns today to discuss radiation treatment options. She was seen in the multidisciplinary breast clinic on 08/09/21.   Since consultation, she underwent genetic testing on 08/09/21. Results were negative, showing no clinically significant variants detected.  She opted to proceed with left breast lumpectomy with nodal biopsies on 09/01/21 under the care of Dr. TMarlou Starks Pathology from the procedure revealed: focal residual high grade DCIS, and no evidence of residual invasive carcinoma; resection margins negative for DCIS, with the closest margin being 0.1 cm from the anterior margin. Nodal status of: 4/4 left axillary sentinel lymph node excisions negative for carcinoma.  Prognostic indicators significant for: ER 95% positive, with strong staining intensity; PR negative; Her2 status negative; Ki67 50%; Grade 2.   On review of systems, the patient reports mild discomfort in the breast area but no pain. She denies swelling in her left arm or hand and any other symptoms.    Allergies:  is allergic to keflex [cephalexin] and shellfish allergy.  Meds: Current Outpatient Medications  Medication Sig Dispense Refill   BETASERON 0.3 MG KIT injection ADD 1.2 ML DILUENT TO VIAL AND MIX GENTLY. INJECT 1 ML SUBCUTANEOUSLY EVERY OTHER DAY. USE WITHIN 3 HOURS OF MIXING. STORE AT ROOM TEMPERATURE. 42 kit 5   Chlorpheniramine Maleate  (ALLERGY PO) Take by mouth.     cholecalciferol (VITAMIN D) 1000 UNITS tablet Take 1,000 Units by mouth daily.     COLLAGEN PO Take 2 each by mouth daily.     Multiple Vitamin (MULTIVITAMIN) tablet Take 1 tablet by mouth daily.     Multiple Vitamins-Minerals (ZINC PO) Take 1 tablet by mouth daily.     HYDROcodone-acetaminophen (NORCO/VICODIN) 5-325 MG tablet Take 1 tablet by mouth every 6 (six) hours as needed for moderate pain or severe pain. (Patient not taking: Reported on 09/28/2021) 10 tablet 0   No current facility-administered medications for this encounter.   Facility-Administered Medications Ordered in Other Encounters  Medication Dose Route Frequency Provider Last Rate Last Admin   gadopentetate dimeglumine (MAGNEVIST) injection 15 mL  15 mL Intravenous Once PRN WKathrynn Ducking MD        Physical Findings: The patient is in no acute distress. Patient is alert and oriented.  height is _0  (1.6 m) and weight is 175 lb 12.8 oz (79.7 kg). Her temperature is 97.8 F (36.6 C). Her blood pressure is 113/84 and her pulse is 92. Her respiration is 20 and oxygen saturation is 100%.   Lungs are clear to auscultation bilaterally. Heart has regular rate and rhythm. No palpable cervical, supraclavicular, or axillary adenopathy. Abdomen soft, non-tender, normal bowel sounds.  Left breast: Well-healing scar in the periareolar area.  Patient also has a no scar in the axillary region which is also healing well without signs of drainage or infection.  The patient has a  large and pendulous breast.  Lab Findings:  Lab Results  Component Value Date   WBC 5.8 08/09/2021   HGB 11.3 (L) 08/09/2021   HCT 35.7 (L) 08/09/2021   MCV 91.1 08/09/2021   PLT 323 08/09/2021    Radiographic Findings: NM Sentinel Node Inj-No Rpt (Breast)  Result Date: 09/01/2021 Sulfur Colloid was injected by the Nuclear Medicine Technologist for sentinel lymph node localization.   MM Breast Surgical  Specimen  Result Date: 09/01/2021 CLINICAL DATA:  Patient is post radioactive seed localization subsequent surgical excision left breast. EXAM: SPECIMEN RADIOGRAPH OF THE LEFT BREAST COMPARISON:  Previous exam(s). FINDINGS: Status post excision of the left breast. The radioactive seed and coil shaped biopsy marker clip are present, completely intact, and were marked for pathology. Results called to the OR at the time of dictation. IMPRESSION: Specimen radiograph of the left breast. Electronically Signed   By: Marin Olp M.D.   On: 09/01/2021 12:04  MM LT RADIOACTIVE SEED LOC MAMMO GUIDE  Result Date: 08/31/2021 CLINICAL DATA:  Patient presents for mammographically guided seed localization of an area of DCIS in the left breast prior to surgical excision. EXAM: MAMMOGRAPHIC GUIDED RADIOACTIVE SEED LOCALIZATION OF THE LEFT BREAST COMPARISON:  Previous exam(s). FINDINGS: Patient presents for radioactive seed localization prior to surgical excision. I met with the patient and we discussed the procedure of seed localization including benefits and alternatives. We discussed the high likelihood of a successful procedure. We discussed the risks of the procedure including infection, bleeding, tissue injury and further surgery. We discussed the low dose of radioactivity involved in the procedure. Informed, written consent was given. The usual time-out protocol was performed immediately prior to the procedure. Using mammographic guidance, sterile technique, 1% lidocaine and an I-125 radioactive seed, the coil shaped biopsy clip and residual calcifications were localized using a lateral approach. The follow-up mammogram images confirm the seed in the expected location and were marked for Dr. Marlou Starks. Follow-up survey of the patient confirms presence of the radioactive seed. Order number of I-125 seed:  [id number]. Total activity:  1.696 millicuries reference Date: 05/19/2021 The patient tolerated the procedure well and was  released from the [place]. She was given instructions regarding seed removal. IMPRESSION: Radioactive seed localization of the left breast. No apparent complications. Electronically Signed   By: Lajean Manes M.D.   On: 08/31/2021 14:38   Impression:  Status post left lumpectomy: Stage IA (cT53m, cN0, cM0) Left Breast LOQ, Focal-residual high-grade DCIS, with no residual invasive ductal carcinoma, ER+ / PR- / Her2-  The patient will the patient would be a good candidate for radiation therapy as breast conserving surgery.  Given her young age I would not recommend lumpectomy alone in this situation.  I discussed the general course of treatment side effects and potential toxicities of radiation therapy.  The patient appears to understand and wishes to proceed with planned course of treatment.  Plan:  Patient is scheduled for CT simulation next week.  Treatments to begin approximately 2 weeks from now.  We will use cardiac sparing techniques if necessary.  Given the patient's large breast size however we may need to consider prone treatment for her set up. Doubt that she would be a candidate for hypofractionated accelerated radiation therapy given her large breast size.  -----------------------------------  JBlair Promise PhD, MD  This document serves as a record of services personally performed by JGery Pray MD. It was created on his behalf by ERoney Mans a trained medical scribe. The creation of this record is based on  the scribe's personal observations and the provider's statements to them. This document has been checked and approved by the attending provider.

## 2021-09-28 ENCOUNTER — Encounter: Payer: Self-pay | Admitting: Radiation Oncology

## 2021-09-28 ENCOUNTER — Ambulatory Visit
Admission: RE | Admit: 2021-09-28 | Discharge: 2021-09-28 | Disposition: A | Discharge status: Home / Self Care | Payer: BC Managed Care – PPO | Source: Ambulatory Visit | Attending: Radiation Oncology | Admitting: Radiation Oncology

## 2021-09-28 ENCOUNTER — Other Ambulatory Visit: Payer: Self-pay

## 2021-09-28 VITALS — BP 113/84 | HR 92 | Temp 97.8°F | Resp 20 | Ht 63.0 in | Wt 175.8 lb

## 2021-09-28 DIAGNOSIS — C50512 Malignant neoplasm of lower-outer quadrant of left female breast: Secondary | ICD-10-CM

## 2021-09-28 DIAGNOSIS — Z170 Estrogen receptor positive status [ER+]: Secondary | ICD-10-CM | POA: Diagnosis not present

## 2021-09-28 DIAGNOSIS — Z79899 Other long term (current) drug therapy: Secondary | ICD-10-CM | POA: Diagnosis not present

## 2021-09-28 NOTE — Progress Notes (Signed)
See MD note for nursing evaluation. °

## 2021-10-02 ENCOUNTER — Other Ambulatory Visit: Payer: Self-pay

## 2021-10-02 ENCOUNTER — Encounter: Payer: Self-pay | Admitting: Physical Therapy

## 2021-10-02 ENCOUNTER — Ambulatory Visit: Payer: BC Managed Care – PPO | Attending: General Surgery | Admitting: Physical Therapy

## 2021-10-02 DIAGNOSIS — R293 Abnormal posture: Secondary | ICD-10-CM | POA: Insufficient documentation

## 2021-10-02 DIAGNOSIS — C50412 Malignant neoplasm of upper-outer quadrant of left female breast: Secondary | ICD-10-CM | POA: Insufficient documentation

## 2021-10-02 DIAGNOSIS — Z170 Estrogen receptor positive status [ER+]: Secondary | ICD-10-CM | POA: Insufficient documentation

## 2021-10-02 DIAGNOSIS — Z483 Aftercare following surgery for neoplasm: Secondary | ICD-10-CM | POA: Diagnosis present

## 2021-10-02 NOTE — Patient Instructions (Addendum)
     [facility]  [address], [address]  [place] [address]  [phone]  After Breast Cancer Class It is recommended you attend the ABC class to be educated on lymphedema risk reduction. This class is free of charge and lasts for 1 hour. It is a 1-time class. I gave you the information about it since you don't want to attend.  Scar massage You can begin gentle scar massage with coconut oil a few minutes each day.  Compression garment Continue wearing a good bra that provides you compression.  Home exercise Program You should continue doing the stretches I gave you and add the other 2 below.  Follow up PT: It is recommended you return every 3 months for the first 3 years following surgery to be assessed on the SOZO machine for an L-Dex score. This helps prevent clinically significant lymphedema in 95% of patients. These follow up screens are 10 minute appointments that you are not billed for. You are scheduled for December 27th at 10:10.  Shoulder Internal Rotation    Standing, feet shoulder width apart, grasp club with one hand palm forward, arm extended above head, and other hand palm back behind back, arm bent elbow down. Pull gently upward. Hold __5__ seconds. Switch arms and repeat. Repeat __5__ times. Do __2__ sessions per day.  Copyright  VHI. All rights reserved.   Closed Chain: Shoulder Flexion / Extension - on Wall    Hands on wall, step backward. Return. Stepping causes shoulder flexion and extension Do _5__ times, holding 5 seconds, _2__ times per day.  http://ss.exer.us/265   Copyright  VHI. All rights reserved.

## 2021-10-02 NOTE — Therapy (Signed)
[facility] @ [place], [place], [address] Phone: [phone]   Fax:  [phone]  Physical Therapy Treatment  Patient Details  Name: Joyce Bennett MRN: [id number] Date of Birth: December 22, 1955 Referring Provider (PT): Dr. Autumn Messing   Encounter Date: 10/02/2021   PT End of Session - 10/02/21 1439     Visit Number 2    Number of Visits 2    PT Start Time 1402    PT Stop Time 1439    PT Time Calculation (min) 37 min    Activity Tolerance Patient tolerated treatment well    Behavior During Therapy [facility] for tasks assessed/performed             Past Medical History:  Diagnosis Date   Breast cancer ([place])    Family history of breast cancer 08/10/2021   Family history of prostate cancer 08/10/2021   Multiple sclerosis ([place])    Neurogenic bladder    Optic neuritis, left    Restless leg syndrome    Urinary tract infection    Recurrent    Past Surgical History:  Procedure Laterality Date   BREAST LUMPECTOMY WITH RADIOACTIVE SEED AND SENTINEL LYMPH NODE BIOPSY Left 09/01/2021   Procedure: LEFT BREAST LUMPECTOMY WITH RADIOACTIVE SEED AND SENTINEL LYMPH NODE BIOPSY;  Surgeon: Jovita Kussmaul, MD;  Location: [facility];  Service: General;  Laterality: Left;   FOOT SURGERY N/A    HAND SURGERY Right     There were no vitals filed for this visit.   Subjective Assessment - 10/02/21 1406     Subjective Patient reports she underwent a left lumpectomy and sentinel node biopsy (4 negative nodes) on 09/01/2021. She will undergo radiation after her consult 10/05/2021.    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 She underwent a left lumpectomy and sentinel node biopsy (4 negative nodes) on 09/01/2021. It is ER positive, PR negative, and HER2 negative with a Ki67 of 50%. She had right hand surgery 40 years ago with  hardware placed but it has since been removed.    Patient Stated Goals See if my is doing ok                [facility] PT Assessment - 10/02/21 0001       Assessment   Medical Diagnosis s/p left lumpectomy and SLNB    Referring Provider (PT) Dr. Autumn Messing    Onset Date/Surgical Date 09/01/21    Hand Dominance Right    Prior Therapy Baselines      Precautions   Precautions Other (comment)    Precaution Comments recent surgery; lfet arm lymphedema risk      Restrictions   Weight Bearing Restrictions No      Balance Screen   Has the patient fallen in the past 6 months No    Has the patient had a decrease in activity level because of a fear of falling?  No    Is the patient reluctant to leave their home because of a fear of falling?  No      Home Environment   Living Environment Private residence    Living Arrangements Spouse/significant other;Children   Husband and 46 y.o. daughter   Available Help at Discharge Family      Prior Function   Level of Independence Independent    Vocation Full time employment    Event organiser  school bus for [employer]    Leisure She has not returned to walking      Cognition   Overall Cognitive Status Within Functional Limits for tasks assessed      Observation/Other Assessments   Observations Some heaviness and tightness present in left inferior breast. Axillary and breast incisions both appear to be healing well.      Posture/Postural Control   Posture/Postural Control Postural limitations    Postural Limitations Rounded Shoulders;Forward head      ROM / Strength   AROM / PROM / Strength AROM      AROM   AROM Assessment Site Shoulder    Right/Left Shoulder Left    Left Shoulder Extension 48 Degrees    Left Shoulder Flexion 136 Degrees    Left Shoulder ABduction 152 Degrees    Left Shoulder Internal Rotation 78 Degrees    Left Shoulder External Rotation 78 Degrees      Strength   Overall Strength Unable to assess;Due to  precautions               LYMPHEDEMA/ONCOLOGY QUESTIONNAIRE - 10/02/21 0001       Type   Cancer Type Left breast cancer      Surgeries   Lumpectomy Date 09/01/21    Sentinel Lymph Node Biopsy Date 09/01/21    Number Lymph Nodes Removed 3      Treatment   Active Chemotherapy Treatment No    Past Chemotherapy Treatment No    Active Radiation Treatment No    Past Radiation Treatment No    Current Hormone Treatment No    Past Hormone Therapy No      What other symptoms do you have   Are you Having Heaviness or Tightness No    Are you having Pain No    Are you having pitting edema No    Is it Hard or Difficult finding clothes that fit No    Do you have infections No    Is there Decreased scar mobility Yes    Stemmer Sign No      Right Upper Extremity Lymphedema   10 cm Proximal to Olecranon Process 28.4 cm    Olecranon Process 24.5 cm    10 cm Proximal to Ulnar Styloid Process 20.4 cm    Just Proximal to Ulnar Styloid Process 15.1 cm    Across Hand at [employer] 18.4 cm    At [place] of 2nd Digit 6.1 cm      Left Upper Extremity Lymphedema   10 cm Proximal to Olecranon Process 28.2 cm    Olecranon Process 24.5 cm    10 cm Proximal to Ulnar Styloid Process 19.8 cm    Just Proximal to Ulnar Styloid Process 14.8 cm    Across Hand at [employer] 17.1 cm    At [place] of 2nd Digit 5.7 cm                Quick Dash - 10/02/21 0001     Open a tight or new jar Mild difficulty    Do heavy household chores (wash walls, wash floors) No difficulty    Carry a shopping bag or briefcase No difficulty    Wash your back Mild difficulty    Use a knife to cut food No difficulty    Recreational activities in which you take some force or impact through your arm, shoulder, or hand (golf, hammering, tennis) Mild difficulty    During the past week,  to what extent has your arm, shoulder or hand problem interfered with your normal social activities with family, friends,  neighbors, or groups? Not at all    During the past week, to what extent has your arm, shoulder or hand problem limited your work or other regular daily activities Not at all    Arm, shoulder, or hand pain. None    Tingling (pins and needles) in your arm, shoulder, or hand None    Difficulty Sleeping No difficulty    DASH Score 6.82 %                             PT Education - 10/02/21 1438     Education Details Aftercare; lymphedema education    Person(s) Educated Patient    Methods Explanation;Handout;Demonstration    Comprehension Returned demonstration;Verbalized understanding                 PT Long Term Goals - 10/02/21 1442       PT LONG TERM GOAL #1   Title Patient will demonstrate she has regained full shoulder ROM and function post operatively compared to baselines.    Time 8    Period Weeks    Status Partially Met                   Plan - 10/02/21 1439     Clinical Impression Statement Patient is doing very well s/p left lumpectomy and sentinel node biopsy. She has regained full shoulder ROM and function and shows no signs of lympehdema. She has inferior breast swelling which will likely resolve and appears to be normal post surgical swelling. She was given information on lymphedema risk reduction as she does not want to participate in the After Breast Cancer class. Otherwise, she has no PT needs at this time.    PT Treatment/Interventions ADLs/Self Care Home Management;Therapeutic exercise;Patient/family education    PT Next Visit Plan D/C    PT Home Exercise Plan Post op shoulder HEP and IR stretch and closed chain flexion exercise for shoulder    Consulted and Agree with Plan of Care Patient             Patient will benefit from skilled therapeutic intervention in order to improve the following deficits and impairments:  Postural dysfunction, Decreased range of motion, Decreased knowledge of precautions, Impaired UE  functional use, Pain, Increased edema  Visit Diagnosis: Malignant neoplasm of upper-outer quadrant of left breast in female, estrogen receptor positive [facility])  Aftercare following surgery for neoplasm  Abnormal posture     Problem List Patient Active Problem List   Diagnosis Date Noted   Genetic testing 08/24/2021   Family history of breast cancer 08/10/2021   Family history of prostate cancer 08/10/2021   Malignant neoplasm of lower-outer quadrant of left breast of female, estrogen receptor positive ([place]) 08/03/2021   Multiple sclerosis ([place]) 07/28/2012    PHYSICAL THERAPY DISCHARGE SUMMARY  Visits from Start of Care: 2  Current functional level related to goals / functional outcomes: Goal partially met. She is only limited with left shoulder active flexion but not significantly.   Remaining deficits: Left shoulder active flexion.   Education / Equipment: HEP and lymphedema education  Patient agrees to discharge. Patient goals were partially met. Patient is being discharged due to being pleased with the current functional level.  Annia Friendly, [place] 10/02/21 2:44 PM    [facility] @ [place], [place], [address] Phone: [phone]   Fax:  [phone]  Name: SAFINA HUARD MRN: [id number] Date of Birth: 04-16-1956

## 2021-10-05 ENCOUNTER — Ambulatory Visit
Admission: RE | Admit: 2021-10-05 | Discharge: 2021-10-05 | Disposition: A | Discharge status: Home / Self Care | Payer: BC Managed Care – PPO | Source: Ambulatory Visit | Attending: Radiation Oncology | Admitting: Radiation Oncology

## 2021-10-05 ENCOUNTER — Other Ambulatory Visit: Payer: Self-pay

## 2021-10-05 ENCOUNTER — Encounter: Payer: Self-pay | Admitting: *Deleted

## 2021-10-05 DIAGNOSIS — C50512 Malignant neoplasm of lower-outer quadrant of left female breast: Secondary | ICD-10-CM | POA: Insufficient documentation

## 2021-10-05 DIAGNOSIS — Z170 Estrogen receptor positive status [ER+]: Secondary | ICD-10-CM | POA: Insufficient documentation

## 2021-10-06 ENCOUNTER — Telehealth: Payer: Self-pay | Admitting: Hematology and Oncology

## 2021-10-06 NOTE — Telephone Encounter (Signed)
Scheduled per sch msg. Called and spoke with patient. Confirmed appt  

## 2021-10-12 DIAGNOSIS — C50512 Malignant neoplasm of lower-outer quadrant of left female breast: Secondary | ICD-10-CM | POA: Diagnosis present

## 2021-10-12 DIAGNOSIS — Z170 Estrogen receptor positive status [ER+]: Secondary | ICD-10-CM | POA: Insufficient documentation

## 2021-10-16 ENCOUNTER — Other Ambulatory Visit: Payer: Self-pay

## 2021-10-16 ENCOUNTER — Ambulatory Visit
Admission: RE | Admit: 2021-10-16 | Discharge: 2021-10-16 | Disposition: A | Discharge status: Home / Self Care | Payer: BC Managed Care – PPO | Source: Ambulatory Visit | Attending: Radiation Oncology | Admitting: Radiation Oncology

## 2021-10-16 DIAGNOSIS — C50512 Malignant neoplasm of lower-outer quadrant of left female breast: Secondary | ICD-10-CM | POA: Diagnosis not present

## 2021-10-16 MED ORDER — RADIAPLEXRX EX GEL: Freq: Once | CUTANEOUS | Status: AC

## 2021-10-16 MED ORDER — ALRA NON-METALLIC DEODORANT (RAD-ONC)
1.0000 "application " | Freq: Once | TOPICAL | Status: AC
  Administered 2021-10-16: 1 via TOPICAL

## 2021-10-16 NOTE — Progress Notes (Signed)

## 2021-10-17 ENCOUNTER — Ambulatory Visit
Admission: RE | Admit: 2021-10-17 | Discharge: 2021-10-17 | Disposition: A | Discharge status: Home / Self Care | Payer: BC Managed Care – PPO | Source: Ambulatory Visit | Attending: Radiation Oncology | Admitting: Radiation Oncology

## 2021-10-17 DIAGNOSIS — C50512 Malignant neoplasm of lower-outer quadrant of left female breast: Secondary | ICD-10-CM

## 2021-10-17 MED ORDER — RADIAPLEXRX EX GEL
1.0000 "application " | Freq: Once | CUTANEOUS | Status: AC
  Administered 2021-10-17: 1 via TOPICAL

## 2021-10-18 ENCOUNTER — Other Ambulatory Visit: Payer: Self-pay

## 2021-10-18 ENCOUNTER — Ambulatory Visit
Admission: RE | Admit: 2021-10-18 | Discharge: 2021-10-18 | Disposition: A | Discharge status: Home / Self Care | Payer: BC Managed Care – PPO | Source: Ambulatory Visit | Attending: Radiation Oncology | Admitting: Radiation Oncology

## 2021-10-18 DIAGNOSIS — C50512 Malignant neoplasm of lower-outer quadrant of left female breast: Secondary | ICD-10-CM | POA: Diagnosis not present

## 2021-10-19 ENCOUNTER — Ambulatory Visit
Admission: RE | Admit: 2021-10-19 | Discharge: 2021-10-19 | Disposition: A | Discharge status: Home / Self Care | Payer: BC Managed Care – PPO | Source: Ambulatory Visit | Attending: Radiation Oncology | Admitting: Radiation Oncology

## 2021-10-19 DIAGNOSIS — C50512 Malignant neoplasm of lower-outer quadrant of left female breast: Secondary | ICD-10-CM | POA: Diagnosis not present

## 2021-10-20 ENCOUNTER — Ambulatory Visit
Admission: RE | Admit: 2021-10-20 | Discharge: 2021-10-20 | Disposition: A | Discharge status: Home / Self Care | Payer: BC Managed Care – PPO | Source: Ambulatory Visit | Attending: Radiation Oncology | Admitting: Radiation Oncology

## 2021-10-20 ENCOUNTER — Other Ambulatory Visit: Payer: Self-pay

## 2021-10-20 DIAGNOSIS — C50512 Malignant neoplasm of lower-outer quadrant of left female breast: Secondary | ICD-10-CM | POA: Diagnosis not present

## 2021-10-23 ENCOUNTER — Other Ambulatory Visit: Payer: Self-pay

## 2021-10-23 ENCOUNTER — Ambulatory Visit
Admission: RE | Admit: 2021-10-23 | Discharge: 2021-10-23 | Disposition: A | Discharge status: Home / Self Care | Payer: BC Managed Care – PPO | Source: Ambulatory Visit | Attending: Radiation Oncology | Admitting: Radiation Oncology

## 2021-10-23 DIAGNOSIS — C50512 Malignant neoplasm of lower-outer quadrant of left female breast: Secondary | ICD-10-CM | POA: Diagnosis not present

## 2021-10-24 ENCOUNTER — Ambulatory Visit
Admission: RE | Admit: 2021-10-24 | Discharge: 2021-10-24 | Disposition: A | Discharge status: Home / Self Care | Payer: BC Managed Care – PPO | Source: Ambulatory Visit | Attending: Radiation Oncology | Admitting: Radiation Oncology

## 2021-10-24 ENCOUNTER — Ambulatory Visit: Payer: BC Managed Care – PPO | Admitting: Adult Health

## 2021-10-24 ENCOUNTER — Encounter: Payer: Self-pay | Admitting: Adult Health

## 2021-10-24 VITALS — BP 126/84 | HR 96 | Ht 63.0 in | Wt 179.0 lb

## 2021-10-24 DIAGNOSIS — C50512 Malignant neoplasm of lower-outer quadrant of left female breast: Secondary | ICD-10-CM | POA: Diagnosis not present

## 2021-10-24 DIAGNOSIS — G35 Multiple sclerosis: Secondary | ICD-10-CM | POA: Diagnosis not present

## 2021-10-24 DIAGNOSIS — Z5181 Encounter for therapeutic drug level monitoring: Secondary | ICD-10-CM | POA: Diagnosis not present

## 2021-10-24 NOTE — Progress Notes (Signed)
PATIENT: Joyce Bennett DOB: [birth year]  REASON FOR VISIT: follow up HISTORY FROM: patient  HISTORY OF PRESENT ILLNESS: Today 10/24/21:  Ms. Joyce Bennett is a 65 year old female with a history of multiple sclerosis.  She returns today for follow-up.  Overall she feels that she is doing well.  Denies any changes with her gait or balance.  Denies any new numbness or weakness.  No changes with her vision.  No changes with the bowels or bladder.  She continues on Betaseron.  She states that she is getting radiation for stage I breast cancer.   She returns today for an evaluation.  04/16/21: Ms. Joyce Bennett is a 65 year old female with a history of multiple sclerosis.  She returns today for follow-up.  Overall she feels that she has been doing well.  She denies any new symptoms.  No new numbness or tingling.  No changes with her gait or balance.  No changes with the bowels or bladder.  No visual changes.  She remains on Betaseron.  She returns today for an evaluation.  HISTORY 06/18/19:   Ms. Joyce Bennett is a 65 year old female with a history of multiple sclerosis.  She returns today for follow-up.  She remains on Betaseron.  She denies any new symptoms.  No changes with her gait or balance.  No changes with her bowels or bladder.  No new numbness or weakness.  No changes with her vision.  Her last MRI of the brain was in 2017.  She joins me today for follow-up.  REVIEW OF SYSTEMS: Out of a complete 14 system review of symptoms, the patient complains only of the following symptoms, and all other reviewed systems are negative.  See HPI  ALLERGIES: Allergies  Allergen Reactions   Keflex [Cephalexin]    Shellfish Allergy Swelling    HOME MEDICATIONS: Outpatient Medications Prior to Visit  Medication Sig Dispense Refill   BETASERON 0.3 MG KIT injection ADD 1.2 ML DILUENT TO VIAL AND MIX GENTLY. INJECT 1 ML SUBCUTANEOUSLY EVERY OTHER DAY. USE WITHIN 3 HOURS OF MIXING. STORE AT ROOM TEMPERATURE.  42 kit 5   Chlorpheniramine Maleate (ALLERGY PO) Take by mouth.     cholecalciferol (VITAMIN D) 1000 UNITS tablet Take 1,000 Units by mouth daily.     COLLAGEN PO Take 2 each by mouth daily.     HYDROcodone-acetaminophen (NORCO/VICODIN) 5-325 MG tablet Take 1 tablet by mouth every 6 (six) hours as needed for moderate pain or severe pain. (Patient not taking: Reported on 09/28/2021) 10 tablet 0   Multiple Vitamin (MULTIVITAMIN) tablet Take 1 tablet by mouth daily.     Multiple Vitamins-Minerals (ZINC PO) Take 1 tablet by mouth daily.     Facility-Administered Medications Prior to Visit  Medication Dose Route Frequency Provider Last Rate Last Admin   gadopentetate dimeglumine (MAGNEVIST) injection 15 mL  15 mL Intravenous Once PRN Kathrynn Ducking, MD        PAST MEDICAL HISTORY: Past Medical History:  Diagnosis Date   Breast cancer [facility])    Family history of breast cancer 08/10/2021   Family history of prostate cancer 08/10/2021   Multiple sclerosis ([place])    Neurogenic bladder    Optic neuritis, left    Restless leg syndrome    Urinary tract infection    Recurrent    PAST SURGICAL HISTORY: Past Surgical History:  Procedure Laterality Date   BREAST LUMPECTOMY WITH RADIOACTIVE SEED AND SENTINEL LYMPH NODE BIOPSY Left 09/01/2021   Procedure: LEFT BREAST LUMPECTOMY WITH RADIOACTIVE  SEED AND SENTINEL LYMPH NODE BIOPSY;  Surgeon: Jovita Kussmaul, MD;  Location: [facility];  Service: General;  Laterality: Left;   FOOT SURGERY N/A    HAND SURGERY Right     FAMILY HISTORY: Family History  Problem Relation Age of Onset   Stomach cancer Sister        dx 83s   Breast cancer Maternal Aunt        dx after 17, x2 maternal aunts   Prostate cancer Maternal Uncle        dx 60s   Heart disease Maternal Grandmother    Breast cancer Paternal Grandmother        dx after 23    SOCIAL HISTORY: Social History   Socioeconomic History   Marital status: Married    Spouse name:  Not on file   Number of children: 1   Years of education: ASSOCIATE   Highest education level: Not on file  Occupational History   Occupation: International aid/development worker: [employer]  Tobacco Use   Smoking status: Never   Smokeless tobacco: Never  Substance and Sexual Activity   Alcohol use: Yes    Comment: rare   Drug use: No   Sexual activity: Not on file  Other Topics Concern   Not on file  Social History Narrative   Patient is married with 1 child.   Patient is right handed.   Patient has a Associate's degree.   Patient drinks 5 cups daily.   Social Determinants of Health   Financial Resource Strain: Not on file  Food Insecurity: Not on file  Transportation Needs: Not on file  Physical Activity: Not on file  Stress: Not on file  Social Connections: Not on file  Intimate Partner Violence: Not on file      PHYSICAL EXAM  Vitals:   10/24/21 1119  BP: 126/84  Pulse: 96  Weight: 179 lb (81.2 kg)  Height: '5\' 3"'  (1.6 m)    Body mass index is 31.71 kg/m.  Generalized: Well developed, in no acute distress   Neurological examination  Mentation: Alert oriented to time, place, history taking. Follows all commands speech and language fluent Cranial nerve II-XII: Pupils were equal round reactive to light. Extraocular movements were full, visual field were full on confrontational test. Facial sensation and strength were normal. Uvula tongue midline. Head turning and shoulder shrug  were normal and symmetric. Motor: The motor testing reveals 5 over 5 strength of all 4 extremities. Good symmetric motor tone is noted throughout.  Sensory: Sensory testing is intact to soft touch on all 4 extremities. No evidence of extinction is noted.  Coordination: Cerebellar testing reveals good finger-nose-finger and heel-to-shin bilaterally.  Gait and station: Gait is normal.  Reflexes: Deep tendon reflexes are symmetric and normal bilaterally.   DIAGNOSTIC DATA (LABS,  IMAGING, TESTING) - I reviewed patient records, labs, notes, testing and imaging myself where available.  Lab Results  Component Value Date   WBC 5.8 08/09/2021   HGB 11.3 (L) 08/09/2021   HCT 35.7 (L) 08/09/2021   MCV 91.1 08/09/2021   PLT 323 08/09/2021      Component Value Date/Time   NA 141 08/09/2021 1243   NA 142 06/22/2020 1319   K 4.3 08/09/2021 1243   CL 105 08/09/2021 1243   CO2 27 08/09/2021 1243   GLUCOSE 80 08/09/2021 1243   BUN 15 08/09/2021 1243   BUN 18 06/22/2020 1319   CREATININE 0.84  08/09/2021 1243   CALCIUM 9.5 08/09/2021 1243   PROT 7.6 08/09/2021 1243   PROT 6.8 06/22/2020 1319   ALBUMIN 3.8 08/09/2021 1243   ALBUMIN 4.1 06/22/2020 1319   AST 24 08/09/2021 1243   ALT 13 08/09/2021 1243   ALKPHOS 130 (H) 08/09/2021 1243   BILITOT 0.3 08/09/2021 1243   GFRNONAA >60 08/09/2021 1243   GFRAA 96 06/22/2020 1319      ASSESSMENT AND PLAN 65 y.o. year old female  has a past medical history of Breast cancer ([place]), Family history of breast cancer (08/10/2021), Family history of prostate cancer (08/10/2021), Multiple sclerosis ([place]), Neurogenic bladder, Optic neuritis, left, Restless leg syndrome, and Urinary tract infection. here with:  Multiple sclerosis  Stable Continue Betaseron  Repeat MRI brain w/wo contrast to look for progression Reviewed Blood work from August  Follow-up in 6 months or sooner if needed    Ward Givens, MSN, NP-C 10/24/2021, 11:13 AM [facility] [address], [place], [place] [address] [phone]

## 2021-10-24 NOTE — Patient Instructions (Signed)
Your Plan:  Continue Betaseron MRI brain ordered If your symptoms worsen or you develop new symptoms please let us know.    Thank you for coming to see [place] at [facility]. I hope we have been able to provide you high quality care today.  You may receive a patient satisfaction survey over the next few weeks. We would appreciate your feedback and comments so that we may continue to improve ourselves and the health of our patients.

## 2021-10-25 ENCOUNTER — Other Ambulatory Visit: Payer: Self-pay

## 2021-10-25 ENCOUNTER — Ambulatory Visit
Admission: RE | Admit: 2021-10-25 | Discharge: 2021-10-25 | Disposition: A | Discharge status: Home / Self Care | Payer: BC Managed Care – PPO | Source: Ambulatory Visit | Attending: Radiation Oncology | Admitting: Radiation Oncology

## 2021-10-25 DIAGNOSIS — C50512 Malignant neoplasm of lower-outer quadrant of left female breast: Secondary | ICD-10-CM | POA: Diagnosis not present

## 2021-10-26 ENCOUNTER — Ambulatory Visit
Admission: RE | Admit: 2021-10-26 | Discharge: 2021-10-26 | Disposition: A | Discharge status: Home / Self Care | Payer: BC Managed Care – PPO | Source: Ambulatory Visit | Attending: Radiation Oncology | Admitting: Radiation Oncology

## 2021-10-26 DIAGNOSIS — C50512 Malignant neoplasm of lower-outer quadrant of left female breast: Secondary | ICD-10-CM | POA: Diagnosis not present

## 2021-10-27 ENCOUNTER — Ambulatory Visit
Admission: RE | Admit: 2021-10-27 | Discharge: 2021-10-27 | Disposition: A | Discharge status: Home / Self Care | Payer: BC Managed Care – PPO | Source: Ambulatory Visit | Attending: Radiation Oncology | Admitting: Radiation Oncology

## 2021-10-27 ENCOUNTER — Other Ambulatory Visit: Payer: Self-pay

## 2021-10-27 DIAGNOSIS — C50512 Malignant neoplasm of lower-outer quadrant of left female breast: Secondary | ICD-10-CM | POA: Diagnosis not present

## 2021-10-29 ENCOUNTER — Ambulatory Visit
Admission: RE | Admit: 2021-10-29 | Discharge: 2021-10-29 | Disposition: A | Discharge status: Home / Self Care | Payer: BC Managed Care – PPO | Source: Ambulatory Visit | Attending: Radiation Oncology | Admitting: Radiation Oncology

## 2021-10-29 DIAGNOSIS — C50512 Malignant neoplasm of lower-outer quadrant of left female breast: Secondary | ICD-10-CM | POA: Diagnosis not present

## 2021-10-30 ENCOUNTER — Ambulatory Visit: Payer: BC Managed Care – PPO

## 2021-10-31 ENCOUNTER — Ambulatory Visit: Payer: BC Managed Care – PPO

## 2021-11-01 ENCOUNTER — Ambulatory Visit: Payer: BC Managed Care – PPO

## 2021-11-06 ENCOUNTER — Ambulatory Visit: Payer: BC Managed Care – PPO | Admitting: Adult Health

## 2021-11-06 ENCOUNTER — Ambulatory Visit: Payer: BC Managed Care – PPO

## 2021-11-06 ENCOUNTER — Ambulatory Visit
Admission: RE | Admit: 2021-11-06 | Discharge: 2021-11-06 | Disposition: A | Discharge status: Home / Self Care | Payer: BC Managed Care – PPO | Source: Ambulatory Visit | Attending: Radiation Oncology | Admitting: Radiation Oncology

## 2021-11-06 ENCOUNTER — Other Ambulatory Visit: Payer: Self-pay

## 2021-11-06 DIAGNOSIS — C50512 Malignant neoplasm of lower-outer quadrant of left female breast: Secondary | ICD-10-CM | POA: Diagnosis not present

## 2021-11-07 ENCOUNTER — Ambulatory Visit
Admission: RE | Admit: 2021-11-07 | Discharge: 2021-11-07 | Disposition: A | Discharge status: Home / Self Care | Payer: BC Managed Care – PPO | Source: Ambulatory Visit | Attending: Radiation Oncology | Admitting: Radiation Oncology

## 2021-11-07 ENCOUNTER — Ambulatory Visit: Payer: BC Managed Care – PPO | Admitting: Radiation Oncology

## 2021-11-07 ENCOUNTER — Ambulatory Visit: Payer: BC Managed Care – PPO

## 2021-11-07 DIAGNOSIS — C50512 Malignant neoplasm of lower-outer quadrant of left female breast: Secondary | ICD-10-CM | POA: Diagnosis not present

## 2021-11-08 ENCOUNTER — Ambulatory Visit: Payer: BC Managed Care – PPO

## 2021-11-08 ENCOUNTER — Other Ambulatory Visit: Payer: Self-pay

## 2021-11-08 ENCOUNTER — Ambulatory Visit
Admission: RE | Admit: 2021-11-08 | Discharge: 2021-11-08 | Disposition: A | Discharge status: Home / Self Care | Payer: BC Managed Care – PPO | Source: Ambulatory Visit | Attending: Radiation Oncology | Admitting: Radiation Oncology

## 2021-11-08 DIAGNOSIS — C50512 Malignant neoplasm of lower-outer quadrant of left female breast: Secondary | ICD-10-CM | POA: Diagnosis not present

## 2021-11-09 ENCOUNTER — Ambulatory Visit: Payer: BC Managed Care – PPO

## 2021-11-09 ENCOUNTER — Ambulatory Visit
Admission: RE | Admit: 2021-11-09 | Discharge: 2021-11-09 | Disposition: A | Discharge status: Home / Self Care | Payer: BC Managed Care – PPO | Source: Ambulatory Visit | Attending: Radiation Oncology | Admitting: Radiation Oncology

## 2021-11-09 DIAGNOSIS — C50512 Malignant neoplasm of lower-outer quadrant of left female breast: Secondary | ICD-10-CM | POA: Diagnosis not present

## 2021-11-09 DIAGNOSIS — Z170 Estrogen receptor positive status [ER+]: Secondary | ICD-10-CM | POA: Diagnosis present

## 2021-11-10 ENCOUNTER — Other Ambulatory Visit: Payer: Self-pay

## 2021-11-10 ENCOUNTER — Ambulatory Visit: Payer: BC Managed Care – PPO

## 2021-11-10 ENCOUNTER — Ambulatory Visit
Admission: RE | Admit: 2021-11-10 | Discharge: 2021-11-10 | Disposition: A | Discharge status: Home / Self Care | Payer: BC Managed Care – PPO | Source: Ambulatory Visit | Attending: Radiation Oncology | Admitting: Radiation Oncology

## 2021-11-10 DIAGNOSIS — C50512 Malignant neoplasm of lower-outer quadrant of left female breast: Secondary | ICD-10-CM | POA: Diagnosis not present

## 2021-11-13 ENCOUNTER — Ambulatory Visit
Admission: RE | Admit: 2021-11-13 | Discharge: 2021-11-13 | Disposition: A | Discharge status: Home / Self Care | Payer: BC Managed Care – PPO | Source: Ambulatory Visit | Attending: Radiation Oncology | Admitting: Radiation Oncology

## 2021-11-13 ENCOUNTER — Other Ambulatory Visit: Payer: Self-pay

## 2021-11-13 ENCOUNTER — Ambulatory Visit: Payer: BC Managed Care – PPO

## 2021-11-13 DIAGNOSIS — C50512 Malignant neoplasm of lower-outer quadrant of left female breast: Secondary | ICD-10-CM | POA: Diagnosis not present

## 2021-11-14 ENCOUNTER — Ambulatory Visit
Admission: RE | Admit: 2021-11-14 | Discharge: 2021-11-14 | Disposition: A | Discharge status: Home / Self Care | Payer: BC Managed Care – PPO | Source: Ambulatory Visit | Attending: Radiation Oncology | Admitting: Radiation Oncology

## 2021-11-14 ENCOUNTER — Ambulatory Visit: Payer: BC Managed Care – PPO

## 2021-11-14 DIAGNOSIS — C50512 Malignant neoplasm of lower-outer quadrant of left female breast: Secondary | ICD-10-CM | POA: Diagnosis not present

## 2021-11-15 ENCOUNTER — Ambulatory Visit
Admission: RE | Admit: 2021-11-15 | Discharge: 2021-11-15 | Disposition: A | Discharge status: Home / Self Care | Payer: BC Managed Care – PPO | Source: Ambulatory Visit | Attending: Radiation Oncology | Admitting: Radiation Oncology

## 2021-11-15 ENCOUNTER — Ambulatory Visit: Payer: BC Managed Care – PPO

## 2021-11-15 ENCOUNTER — Other Ambulatory Visit: Payer: Self-pay

## 2021-11-15 DIAGNOSIS — C50512 Malignant neoplasm of lower-outer quadrant of left female breast: Secondary | ICD-10-CM | POA: Diagnosis not present

## 2021-11-16 ENCOUNTER — Ambulatory Visit: Payer: BC Managed Care – PPO

## 2021-11-16 ENCOUNTER — Ambulatory Visit
Admission: RE | Admit: 2021-11-16 | Discharge: 2021-11-16 | Disposition: A | Discharge status: Home / Self Care | Payer: BC Managed Care – PPO | Source: Ambulatory Visit | Attending: Radiation Oncology | Admitting: Radiation Oncology

## 2021-11-16 DIAGNOSIS — C50512 Malignant neoplasm of lower-outer quadrant of left female breast: Secondary | ICD-10-CM | POA: Diagnosis not present

## 2021-11-17 ENCOUNTER — Ambulatory Visit
Admission: RE | Admit: 2021-11-17 | Discharge: 2021-11-17 | Disposition: A | Discharge status: Home / Self Care | Payer: BC Managed Care – PPO | Source: Ambulatory Visit | Attending: Radiation Oncology | Admitting: Radiation Oncology

## 2021-11-17 ENCOUNTER — Ambulatory Visit: Payer: BC Managed Care – PPO

## 2021-11-17 ENCOUNTER — Other Ambulatory Visit: Payer: Self-pay

## 2021-11-17 ENCOUNTER — Encounter: Payer: Self-pay | Admitting: Radiation Oncology

## 2021-11-17 DIAGNOSIS — C50512 Malignant neoplasm of lower-outer quadrant of left female breast: Secondary | ICD-10-CM | POA: Diagnosis not present

## 2021-11-20 ENCOUNTER — Ambulatory Visit: Payer: BC Managed Care – PPO

## 2021-11-21 ENCOUNTER — Ambulatory Visit: Payer: BC Managed Care – PPO

## 2021-11-21 ENCOUNTER — Ambulatory Visit: Payer: BC Managed Care – PPO | Admitting: Radiation Oncology

## 2021-11-22 ENCOUNTER — Ambulatory Visit: Payer: BC Managed Care – PPO

## 2021-11-23 ENCOUNTER — Ambulatory Visit: Payer: BC Managed Care – PPO

## 2021-11-24 ENCOUNTER — Ambulatory Visit: Payer: BC Managed Care – PPO

## 2021-11-26 NOTE — Assessment & Plan Note (Signed)
09/01/2021:Left lumpectomy: Focal residual DCIS high-grade, no residual invasive ductal carcinoma, resection margins negative, 0/4 lymph nodes negative, tumor size: 0.1 cm, ER 95%, PR 0%, HER2 1+, Ki-67 50%  Treatment plan: 1.  Given the small size of the invasive ductal carcinoma there is no role of Oncotype DX testing. 2. adjuvant radiation therapy completed 11/17/21 3.  Adjuvant antiestrogens with Letrozole for 5 years  Letrozole counseling: We discussed the risks and benefits of anti-estrogen therapy with aromatase inhibitors. These include but not limited to insomnia, hot flashes, mood changes, vaginal dryness, bone density loss, and weight gain. We strongly believe that the benefits far outweigh the risks. Patient understands these risks and consented to starting treatment. Planned treatment duration is 5 years.   Return to clinic in 3 months for SCP visit.

## 2021-11-26 NOTE — Progress Notes (Signed)
Patient Care Team: [name], MD as PCP - General (Family Medicine) Rockwell Germany, RN as Oncology Nurse Navigator Mauro Kaufmann, RN as Oncology Nurse Navigator Jovita Kussmaul, MD as Consulting Physician (General Surgery) Nicholas Lose, MD as Consulting Physician (Hematology and Oncology) Gery Pray, MD as Consulting Physician (Radiation Oncology)  DIAGNOSIS:    ICD-10-CM   1. Malignant neoplasm of lower-outer quadrant of left breast of female, estrogen receptor positive ([place])  C50.512    Z17.0       SUMMARY OF ONCOLOGIC HISTORY: Oncology History  Malignant neoplasm of lower-outer quadrant of left breast of female, estrogen receptor positive ([place])  08/03/2021 Initial Diagnosis   Left diagnostic mammogram: suspicious calcifications in the lower outer quadrant of the left breast. 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    08/09/2021 Cancer Staging   Staging form: Breast, AJCC 8th Edition - Clinical stage from 08/09/2021: Stage IA (cT80m, cN0, cM0, G2, ER+, PR-, HER2-) - Signed by GNicholas Lose MD on 08/09/2021 Stage prefix: Initial diagnosis Histologic grading system: 3 grade system    08/18/2021 Genetic Testing   Negative hereditary cancer genetic testing: no pathogenic variants detected in [facility] BRCAPlus Panel or [facility] CancerNext-Expanded +RNAinsight Panel.  The report dates are August 18, 2021 and August 25, 2021, respectively.   The BRCAplus panel offered by [employer] includes sequencing and deletion/duplication analysis for the following 8 genes: ATM, BRCA1, BRCA2, CDH1, CHEK2, PALB2, PTEN, and TP53.  The CancerNext-Expanded gene panel offered by [facility] includes sequencing, rearrangement, and RNA analysis for the following 77 genes: AIP, ALK, APC, ATM, AXIN2, BAP1, BARD1, BLM, BMPR1A, BRCA1, BRCA2, BRIP1, CDC73, CDH1, CDK4, CDKN1B, CDKN2A, CHEK2, CTNNA1, DICER1, FANCC, FH, FLCN, GALNT12, KIF1B,  LZTR1, MAX, MEN1, MET, MLH1, MSH2, MSH3, MSH6, MUTYH, NBN, NF1, NF2, NTHL1, PALB2, PHOX2B, PMS2, POT1, PRKAR1A, PTCH1, PTEN, RAD51C, RAD51D, RB1, RECQL, RET, SDHA, SDHAF2, SDHB, SDHC, SDHD, SMAD4, SMARCA4, SMARCB1, SMARCE1, STK11, SUFU, TMEM127, TP53, TSC1, TSC2, VHL and XRCC2 (sequencing and deletion/duplication); EGFR, EGLN1, HOXB13, KIT, MITF, PDGFRA, POLD1, and POLE (sequencing only); EPCAM and GREM1 (deletion/duplication only).    09/01/2021 Surgery   Left lumpectomy: Focal residual DCIS high-grade, no residual invasive ductal carcinoma, resection margins negative, 0/4 lymph nodes negative, tumor size: 0.1 cm, ER 95%, PR 0%, HER2 1+, Ki-67 50%     CHIEF COMPLIANT: Follow-up of left breast cancer  INTERVAL HISTORY: Joyce SIGUENZAis a 65y.o. with above-mentioned history of left breast cancer treated with lumpectomy and radiation therapy. She presents to the clinic today for follow-up.  She has done extremely well from radiation standpoint.  Denies any major pain or discomfort.  ALLERGIES:  is allergic to keflex [cephalexin] and shellfish allergy.  MEDICATIONS:  Current Outpatient Medications  Medication Sig Dispense Refill   BETASERON 0.3 MG KIT injection ADD 1.2 ML DILUENT TO VIAL AND MIX GENTLY. INJECT 1 ML SUBCUTANEOUSLY EVERY OTHER DAY. USE WITHIN 3 HOURS OF MIXING. STORE AT ROOM TEMPERATURE. 42 kit 5   CALCIUM PO Take by mouth daily.     Chlorpheniramine Maleate (ALLERGY PO) Take by mouth.     cholecalciferol (VITAMIN D) 1000 UNITS tablet Take 1,000 Units by mouth daily.     COLLAGEN PO Take 2 each by mouth daily.     HYDROcodone-acetaminophen (NORCO/VICODIN) 5-325 MG tablet Take 1 tablet by mouth every 6 (six) hours as needed for moderate pain or severe pain. 10 tablet 0   Multiple Vitamin (MULTIVITAMIN) tablet  Take 1 tablet by mouth daily.     Multiple Vitamins-Minerals (ZINC PO) Take 1 tablet by mouth daily.     No current facility-administered medications for this visit.    Facility-Administered Medications Ordered in Other Visits  Medication Dose Route Frequency Provider Last Rate Last Admin   gadopentetate dimeglumine (MAGNEVIST) injection 15 mL  15 mL Intravenous Once PRN Kathrynn Ducking, MD        PHYSICAL EXAMINATION: ECOG PERFORMANCE STATUS: 1 - Symptomatic but completely ambulatory  Vitals:   11/27/21 0950  BP: 131/74  Pulse: 92  Resp: 18  Temp: 97.7 F (36.5 C)  SpO2: 100%   Filed Weights   11/27/21 0950  Weight: 170 lb 5 oz (77.3 kg)      LABORATORY DATA:  I have reviewed the data as listed CMP Latest Ref Rng & Units 08/09/2021 06/22/2020 06/18/2019  Glucose 70 - 99 mg/dL 80 73 77  BUN 8 - 23 mg/dL _0 Creatinine 0.44 - 1.00 mg/dL 0.84 0.77 0.87  Sodium 135 - 145 mmol/L 141 142 144  Potassium 3.5 - 5.1 mmol/L 4.3 4.9 4.8  Chloride 98 - 111 mmol/L 105 105 103  CO2 22 - 32 mmol/L _1 Calcium 8.9 - 10.3 mg/dL 9.5 9.4 9.3  Total Protein 6.5 - 8.1 g/dL 7.6 6.8 7.1  Total Bilirubin 0.3 - 1.2 mg/dL 0.3 <0.2 0.2  Alkaline Phos 38 - 126 U/L 130(H) 143(H) 127(H)  AST 15 - 41 U/L _2 ALT 0 - 44 U/L _3 Lab Results  Component Value Date   WBC 5.8 08/09/2021   HGB 11.3 (L) 08/09/2021   HCT 35.7 (L) 08/09/2021   MCV 91.1 08/09/2021   PLT 323 08/09/2021   NEUTROABS 2.7 08/09/2021    ASSESSMENT & PLAN:  Malignant neoplasm of lower-outer quadrant of left breast of female, estrogen receptor positive ([place]) 09/01/2021:Left lumpectomy: Focal residual DCIS high-grade, no residual invasive ductal carcinoma, resection margins negative, 0/4 lymph nodes negative, tumor size: 0.1 cm, ER 95%, PR 0%, HER2 1+, Ki-67 50%   Treatment plan: 1.  Given the small size of the invasive ductal carcinoma there is no role of Oncotype DX testing. 2. adjuvant radiation therapy completed 11/17/21 3.  Adjuvant antiestrogens with Letrozole for 5 years starting 11/27/2021  Letrozole counseling: We discussed the risks and benefits of  anti-estrogen therapy with aromatase inhibitors. These include but not limited to insomnia, hot flashes, mood changes, vaginal dryness, bone density loss, and weight gain. We strongly believe that the benefits far outweigh the risks. Patient understands these risks and consented to starting treatment. Planned treatment duration is 5 years.  Patient gets bone density test with her primary care physician. Return to clinic in 3 months for SCP visit.    No orders of the defined types were placed in this encounter.  The patient has a good understanding of the overall plan. she agrees with it. she will call with any problems that may develop before the next visit here.  Total time spent: 30 mins including face to face time and time spent for planning, charting and coordination of care  Rulon Eisenmenger, MD, MPH 11/27/2021  I, Thana Ates, am acting as scribe for Dr. Nicholas Lose.  I have reviewed the above documentation for accuracy and completeness, and I agree with the above.

## 2021-11-27 ENCOUNTER — Encounter: Payer: Self-pay | Admitting: *Deleted

## 2021-11-27 ENCOUNTER — Other Ambulatory Visit: Payer: Self-pay

## 2021-11-27 ENCOUNTER — Inpatient Hospital Stay: Payer: BC Managed Care – PPO | Attending: Hematology and Oncology | Admitting: Hematology and Oncology

## 2021-11-27 ENCOUNTER — Ambulatory Visit: Payer: BC Managed Care – PPO

## 2021-11-27 DIAGNOSIS — R21 Rash and other nonspecific skin eruption: Secondary | ICD-10-CM | POA: Insufficient documentation

## 2021-11-27 DIAGNOSIS — Z170 Estrogen receptor positive status [ER+]: Secondary | ICD-10-CM | POA: Diagnosis not present

## 2021-11-27 DIAGNOSIS — C50512 Malignant neoplasm of lower-outer quadrant of left female breast: Secondary | ICD-10-CM | POA: Insufficient documentation

## 2021-11-27 DIAGNOSIS — Z79899 Other long term (current) drug therapy: Secondary | ICD-10-CM | POA: Insufficient documentation

## 2021-11-27 DIAGNOSIS — Z923 Personal history of irradiation: Secondary | ICD-10-CM | POA: Diagnosis not present

## 2021-11-27 DIAGNOSIS — Z79811 Long term (current) use of aromatase inhibitors: Secondary | ICD-10-CM | POA: Diagnosis not present

## 2021-11-27 MED ORDER — TRIMETHOPRIM 100 MG PO TABS: 100.0000 mg | ORAL_TABLET | Freq: Every evening | ORAL | Status: DC | PRN

## 2021-11-27 MED ORDER — LETROZOLE 2.5 MG PO TABS: 2.5000 mg | ORAL_TABLET | Freq: Every day | ORAL | 3 refills | Status: DC

## 2021-11-28 ENCOUNTER — Ambulatory Visit: Payer: BC Managed Care – PPO

## 2021-11-29 ENCOUNTER — Ambulatory Visit: Payer: BC Managed Care – PPO

## 2021-11-30 ENCOUNTER — Ambulatory Visit: Payer: BC Managed Care – PPO

## 2021-12-01 ENCOUNTER — Ambulatory Visit: Payer: BC Managed Care – PPO

## 2021-12-05 ENCOUNTER — Ambulatory Visit: Payer: BC Managed Care – PPO | Attending: General Surgery

## 2021-12-05 ENCOUNTER — Other Ambulatory Visit: Payer: Self-pay

## 2021-12-05 ENCOUNTER — Telehealth: Payer: Self-pay | Admitting: *Deleted

## 2021-12-05 VITALS — Wt 171.2 lb

## 2021-12-05 DIAGNOSIS — Z483 Aftercare following surgery for neoplasm: Secondary | ICD-10-CM | POA: Insufficient documentation

## 2021-12-05 NOTE — Telephone Encounter (Signed)
Patient called office - developed rash over weekend. Describes it as small bumps, no pain, no broken skin. Rash is on chest, under area covered by band of bra. She asked if she should use hydrocortisone or aloe to treat it. In Dr. Geralyn Flash absence, information shared with NP in office. Received recommendation for evaluation in [facility]. Patient in agreement with plan. Schedule message sent to schedule patient for appt tomorrow - per [facility].

## 2021-12-05 NOTE — Therapy (Signed)
[facility] @ [address] [place], [place], [address] Phone: [phone]   Fax:  [phone]  Physical Therapy Treatment  Patient Details  Name: Joyce Bennett MRN: [id number] Date of Birth: Apr 26, 1956 Referring Provider (PT): Dr. Autumn Messing   Encounter Date: 12/05/2021   PT End of Session - 12/05/21 1037     Visit Number 2   # unchanged due to screen only   PT Start Time 1026    PT Stop Time 1037    PT Time Calculation (min) 11 min    Activity Tolerance Patient tolerated treatment well    Behavior During Therapy [facility] for tasks assessed/performed             Past Medical History:  Diagnosis Date   Breast cancer ([place])    Family history of breast cancer 08/10/2021   Family history of prostate cancer 08/10/2021   Multiple sclerosis ([place])    Neurogenic bladder    Optic neuritis, left    Restless leg syndrome    Urinary tract infection    Recurrent    Past Surgical History:  Procedure Laterality Date   BREAST LUMPECTOMY WITH RADIOACTIVE SEED AND SENTINEL LYMPH NODE BIOPSY Left 09/01/2021   Procedure: LEFT BREAST LUMPECTOMY WITH RADIOACTIVE SEED AND SENTINEL LYMPH NODE BIOPSY;  Surgeon: Jovita Kussmaul, MD;  Location: [facility];  Service: General;  Laterality: Left;   FOOT SURGERY N/A    HAND SURGERY Right     Vitals:   12/05/21 1032  Weight: 171 lb 4 oz (77.7 kg)     Subjective Assessment - 12/05/21 1032     Subjective Pt returns for her 3 month L-Dex screen. "I have some new bumps under my breast since finishing radiation 2 weeks ago. Can you look at them? I called the doctor but I haven't heard back yet."    Pertinent History Patient was diagnosed on 07/03/2021 with 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 She underwent a left lumpectomy and sentinel node biopsy (4 negative nodes) on 09/01/2021. It is ER positive, PR negative, and HER2 negative with a Ki67 of 50%.  She had right hand surgery 40 years ago with hardware placed but it has since been removed.                    L-DEX FLOWSHEETS - 12/05/21 1000       L-DEX LYMPHEDEMA SCREENING   Measurement Type Unilateral    L-DEX MEASUREMENT EXTREMITY Upper Extremity    POSITION  Standing    DOMINANT SIDE Right    At Risk Side Left    BASELINE SCORE (UNILATERAL) 4.5    L-DEX SCORE (UNILATERAL) 1.7    VALUE CHANGE (UNILAT) -2.8                                     PT Long Term Goals - 10/02/21 1442       PT LONG TERM GOAL #1   Title Patient will demonstrate she has regained full shoulder ROM and function post operatively compared to baselines.    Time 8    Period Weeks    Status Partially Met                   Plan - 12/05/21 1037     Clinical Impression Statement Pt returns for her 3 month  L-Dex screen. Her change from baseline of -2.8 is WNLs so no further treatment is required at this time except to cont every 3 month L-Dex screens which pt is agreeable to. Upon inspection of inferior aspect of Lt breast pt appears to have what looks like contact dermatitis as evidenced by cluster of small bumps without redness. She reports this itching and has appeared over psat few days. As she just completed radiation about 2 weeks ago explained to her that this is probably a normal reaction from that. She has put in a call to her doctor and has yet to hear back from them. Educated her on signs/symptoms of infection to watch for in mean time like increas skin changes, increased swelling/redness. Pt able to verbalize good understanding. she also reports heaviness in breas tthat was present before radiation but worsened some during, however that it is seeming to improve. So educated her that if that worsens then we can treat that in this clinic and she verbalized understanding.    PT Next Visit Plan Cont every 3 month L-Dex screens for up to 2 years from her [facility]  (~09/02/2023)    Consulted and Agree with Plan of Care Patient             Patient will benefit from skilled therapeutic intervention in order to improve the following deficits and impairments:     Visit Diagnosis: Aftercare following surgery for neoplasm     Problem List Patient Active Problem List   Diagnosis Date Noted   Genetic testing 08/24/2021   Family history of breast cancer 08/10/2021   Family history of prostate cancer 08/10/2021   Malignant neoplasm of lower-outer quadrant of left breast of female, estrogen receptor positive ([place]) 08/03/2021   Multiple sclerosis ([place]) 07/28/2012    Otelia Limes, PTA 12/05/2021, 11:39 AM  [facility] @ [address] [place], [place], [address] Phone: [phone]   Fax:  [phone]  Name: SHANEN NORRIS MRN: [id number] Date of Birth: 06/11/1956

## 2021-12-06 ENCOUNTER — Inpatient Hospital Stay (HOSPITAL_BASED_OUTPATIENT_CLINIC_OR_DEPARTMENT_OTHER): Payer: BC Managed Care – PPO | Admitting: Physician Assistant

## 2021-12-06 VITALS — BP 131/76 | HR 93 | Temp 98.5°F | Resp 18 | Wt 174.1 lb

## 2021-12-06 DIAGNOSIS — Z170 Estrogen receptor positive status [ER+]: Secondary | ICD-10-CM | POA: Diagnosis not present

## 2021-12-06 DIAGNOSIS — C50512 Malignant neoplasm of lower-outer quadrant of left female breast: Secondary | ICD-10-CM | POA: Diagnosis not present

## 2021-12-06 DIAGNOSIS — R21 Rash and other nonspecific skin eruption: Secondary | ICD-10-CM

## 2021-12-06 MED ORDER — NYSTATIN-TRIAMCINOLONE 100000-0.1 UNIT/GM-% EX OINT: 1.0000 "application " | TOPICAL_OINTMENT | Freq: Two times a day (BID) | CUTANEOUS | 0 refills | Status: DC

## 2021-12-06 NOTE — Progress Notes (Signed)
Symptom Management Consult note [facility]    Patient Care Team: Joyce Croak, MD as PCP - General (Family Medicine) Rockwell Germany, RN as Oncology Nurse Navigator Joyce Kaufmann, RN as Oncology Nurse Navigator Joyce Kussmaul, MD as Consulting Physician (General Surgery) Joyce Lose, MD as Consulting Physician (Hematology and Oncology) Joyce Pray, MD as Consulting Physician (Radiation Oncology)    Name of the patient: Joyce Bennett  [id number]  09-08-1956   Date of visit: 12/06/2021    Chief complaint/ Reason for visit- rash  Oncology History  Malignant neoplasm of lower-outer quadrant of left breast of female, estrogen receptor positive ([place])  08/03/2021 Initial Diagnosis   Left diagnostic mammogram: suspicious calcifications in the lower outer quadrant of the left breast. 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    08/09/2021 Cancer Staging   Staging form: Breast, AJCC 8th Edition - Clinical stage from 08/09/2021: Stage IA (cT36m, cN0, cM0, G2, ER+, PR-, HER2-) - Signed by GNicholas Lose MD on 08/09/2021 Stage prefix: Initial diagnosis Histologic grading system: 3 grade system    08/18/2021 Genetic Testing   Negative hereditary cancer genetic testing: no pathogenic variants detected in [facility] BRCAPlus Panel or [facility] CancerNext-Expanded +RNAinsight Panel.  The report dates are August 18, 2021 and August 25, 2021, respectively.   The BRCAplus panel offered by [employer] includes sequencing and deletion/duplication analysis for the following 8 genes: ATM, BRCA1, BRCA2, CDH1, CHEK2, PALB2, PTEN, and TP53.  The CancerNext-Expanded gene panel offered by [facility] includes sequencing, rearrangement, and RNA analysis for the following 77 genes: AIP, ALK, APC, ATM, AXIN2, BAP1, BARD1, BLM, BMPR1A, BRCA1, BRCA2, BRIP1, CDC73, CDH1, CDK4, CDKN1B, CDKN2A, CHEK2, CTNNA1, DICER1, FANCC, FH, FLCN,  GALNT12, KIF1B, LZTR1, MAX, MEN1, MET, MLH1, MSH2, MSH3, MSH6, MUTYH, NBN, NF1, NF2, NTHL1, PALB2, PHOX2B, PMS2, POT1, PRKAR1A, PTCH1, PTEN, RAD51C, RAD51D, RB1, RECQL, RET, SDHA, SDHAF2, SDHB, SDHC, SDHD, SMAD4, SMARCA4, SMARCB1, SMARCE1, STK11, SUFU, TMEM127, TP53, TSC1, TSC2, VHL and XRCC2 (sequencing and deletion/duplication); EGFR, EGLN1, HOXB13, KIT, MITF, PDGFRA, POLD1, and POLE (sequencing only); EPCAM and GREM1 (deletion/duplication only).    09/01/2021 Surgery   Left lumpectomy: Focal residual DCIS high-grade, no residual invasive ductal carcinoma, resection margins negative, 0/4 lymph nodes negative, tumor size: 0.1 cm, ER 95%, PR 0%, HER2 1+, Ki-67 50%     Current Therapy: PO Letrozole 2.5 mg  Interval history- Joyce Bennett a 65yo female with breast cancer history as listed [facility].  Patient states the rash is located underneath her left breast.  She underwent left breast lumpectomy with radioactive seed localization and deep left axillary sentinel lymph node biopsy with Dr. TMarlou Starkson 09/01/2021.  She states she is also finished radiation in the interim.  She overall tolerated surgery and radiation quite well she reports.  Patient states the rash under her left breast itches.  It has started to spread towards the middle of her chest.  She stopped wearing a bra when the rash first started to try to let the area heal.  She has been applying over-the-counter hydrocortisone cream and taking Benadryl without much improvement.  She denies any new lotions, creams or antibiotic use.  Rash did start around the same time that she started taking p.o. letrozole.  She denies any breast changes.  Denies any fever, chills, joint pain, hot flashes, shortness of breath, chest pain, nausea.  No one in the house has similar rash.  She denies history of similar rash.    ROS  All other systems are reviewed and are negative for acute change except  as noted in the HPI.    Allergies  Allergen Reactions   Keflex [Cephalexin]    Shellfish Allergy Swelling     Past Medical History:  Diagnosis Date   Breast cancer ([place])    Family history of breast cancer 08/10/2021   Family history of prostate cancer 08/10/2021   Multiple sclerosis ([facility])    Neurogenic bladder    Optic neuritis, left    Restless leg syndrome    Urinary tract infection    Recurrent     Past Surgical History:  Procedure Laterality Date   BREAST LUMPECTOMY WITH RADIOACTIVE SEED AND SENTINEL LYMPH NODE BIOPSY Left 09/01/2021   Procedure: LEFT BREAST LUMPECTOMY WITH RADIOACTIVE SEED AND SENTINEL LYMPH NODE BIOPSY;  Surgeon: Joyce Kussmaul, MD;  Location: [facility];  Service: General;  Laterality: Left;   FOOT SURGERY N/A    HAND SURGERY Right     Social History   Socioeconomic History   Marital status: Married    Spouse name: Not on file   Number of children: 1   Years of education: ASSOCIATE   Highest education level: Not on file  Occupational History   Occupation: International aid/development worker: [employer]  Tobacco Use   Smoking status: Never   Smokeless tobacco: Never  Substance and Sexual Activity   Alcohol use: Yes    Comment: rare   Drug use: No   Sexual activity: Not on file  Other Topics Concern   Not on file  Social History Narrative   Patient is married with 1 child.   Patient is right handed.   Patient has a Associate's degree.   Patient drinks 5 cups daily.   Social Determinants of Health   Financial Resource Strain: Not on file  Food Insecurity: Not on file  Transportation Needs: Not on file  Physical Activity: Not on file  Stress: Not on file  Social Connections: Not on file  Intimate Partner Violence: Not on file    Family History  Problem Relation Age of Onset   Stomach cancer Sister        dx 65s   Breast cancer Maternal Aunt        dx after 22, x2 maternal aunts   Prostate cancer Maternal  Uncle        dx 28s   Heart disease Maternal Grandmother    Breast cancer Paternal Grandmother        dx after 4   Multiple sclerosis Neg Hx      Current Outpatient Medications:    BETASERON 0.3 MG KIT injection, ADD 1.2 ML DILUENT TO VIAL AND MIX GENTLY. INJECT 1 ML SUBCUTANEOUSLY EVERY OTHER DAY. USE WITHIN 3 HOURS OF MIXING. STORE AT ROOM TEMPERATURE., Disp: 42 kit, Rfl: 5   CALCIUM PO, Take by mouth daily., Disp: , Rfl:    Chlorpheniramine Maleate (ALLERGY PO), Take by mouth., Disp: , Rfl:    cholecalciferol (VITAMIN D) 1000 UNITS tablet, Take 1,000 Units by mouth daily., Disp: , Rfl:    COLLAGEN PO, Take 2 each by mouth daily., Disp: , Rfl:    letrozole (FEMARA) 2.5 MG tablet, Take 1 tablet (2.5 mg total) by mouth daily., Disp: 90 tablet, Rfl: 3   Multiple Vitamin (MULTIVITAMIN) tablet, Take 1 tablet by  mouth daily., Disp: , Rfl:    Multiple Vitamins-Minerals (ZINC PO), Take 1 tablet by mouth daily., Disp: , Rfl:    nystatin-triamcinolone ointment (MYCOLOG), Apply 1 application topically 2 (two) times daily., Disp: 30 g, Rfl: 0   trimethoprim (TRIMPEX) 100 MG tablet, Take 1 tablet (100 mg total) by mouth at bedtime as needed., Disp: , Rfl:  No current facility-administered medications for this visit.  Facility-Administered Medications Ordered in Other Visits:    gadopentetate dimeglumine (MAGNEVIST) injection 15 mL, 15 mL, Intravenous, Once PRN, Kathrynn Ducking, MD  PHYSICAL EXAM: ECOG FS:1 - Symptomatic but completely ambulatory    Vitals:   12/06/21 0948  BP: 131/76  Pulse: 93  Resp: 18  Temp: 98.5 F (36.9 C)  SpO2: 100%  Weight: 174 lb 1.6 oz (79 kg)   Physical Exam Vitals and nursing note reviewed.  Constitutional:      Appearance: She is well-developed. She is not ill-appearing or toxic-appearing.  HENT:     Head: Normocephalic and atraumatic.     Nose: Nose normal.  Eyes:     General: No scleral icterus.       Right eye: No discharge.        Left eye:  No discharge.     Conjunctiva/sclera: Conjunctivae normal.  Neck:     Vascular: No JVD.  Cardiovascular:     Rate and Rhythm: Normal rate and regular rhythm.     Pulses: Normal pulses.     Heart sounds: Normal heart sounds.  Pulmonary:     Effort: Pulmonary effort is normal.     Breath sounds: Normal breath sounds.  Chest:  Breasts:    Right: Normal.     Left: No swelling, bleeding, inverted nipple, mass, nipple discharge or tenderness.     Comments: Papular rash under left breast red in color. No oozing, purulent drainage, or bleeding. No skin sloughing. No surrounding erythema. No palpable fluctuance patient nontoxic-appearing.  Patient yeast infection.  Timing yeast infection rash is based on appearance.  Based on appearance Abdominal:     General: There is no distension.  Musculoskeletal:        General: Normal range of motion.     Cervical back: Normal range of motion.  Lymphadenopathy:     Upper Body:     Left upper body: No supraclavicular, axillary or pectoral adenopathy.  Skin:    General: Skin is warm and dry.  Neurological:     Mental Status: She is oriented to person, place, and time.     GCS: GCS eye subscore is 4. GCS verbal subscore is 5. GCS motor subscore is 6.     Comments: Fluent speech, no facial droop.  Psychiatric:        Behavior: Behavior normal.       LABORATORY DATA: I have reviewed the data as listed CBC Latest Ref Rng & Units 08/09/2021 06/22/2020 06/18/2019  WBC 4.0 - 10.5 K/uL 5.8 5.3 5.0  Hemoglobin 12.0 - 15.0 g/dL 11.3(L) 11.0(L) 11.1  Hematocrit 36.0 - 46.0 % 35.7(L) 35.0 34.3  Platelets 150 - 400 K/uL 323 368 395     CMP Latest Ref Rng & Units 08/09/2021 06/22/2020 06/18/2019  Glucose 70 - 99 mg/dL 80 73 77  BUN 8 - 23 mg/dL '15 18 13  ' Creatinine 0.44 - 1.00 mg/dL 0.84 0.77 0.87  Sodium 135 - 145 mmol/L 141 142 144  Potassium 3.5 - 5.1 mmol/L 4.3 4.9 4.8  Chloride 98 - 111 mmol/L 105  105 103  CO2 22 - 32 mmol/L '27 26 26  ' Calcium 8.9 -  10.3 mg/dL 9.5 9.4 9.3  Total Protein 6.5 - 8.1 g/dL 7.6 6.8 7.1  Total Bilirubin 0.3 - 1.2 mg/dL 0.3 <0.2 0.2  Alkaline Phos 38 - 126 U/L 130(H) 143(H) 127(H)  AST 15 - 41 U/L '24 15 22  ' ALT 0 - 44 U/L '13 8 11       ' RADIOGRAPHIC STUDIES: I have personally reviewed the radiological images as listed and agreed with the findings in the report. No images are attached to the encounter. No results found.   ASSESSMENT & PLAN: Patient is a 65 y.o. female with history of left breast cancer s/p lumpectomy and radiation currently on Letrozole followed by oncologist Dr. Lindi Adie.   #) Rash-patient is nontoxic-appearing. VSS.  Rash is suggestive of yeast infection based on appearance. Rash is not a common side effect of letrozole after discussion with pharmacist although the timing of rash and medication starting is coincidental. She otherwise has side effects from letrozole and is tolerating it well. Engaged in shared decision making with patient and will treat rash with topical Nystatin and OTC benadryl. If rash worsens patient knows to present to ED or call clinic for recheck.  #) Breast cancer- continue treatment per oncologist. Next appointment is scheduled for next month.  Visit Diagnosis: 1. Rash   2. Malignant neoplasm of lower-outer quadrant of left breast of female, estrogen receptor positive ([place])      No orders of the defined types were placed in this encounter.   All questions were answered. The patient knows to call the clinic with any problems, questions or concerns. No barriers to learning was detected.  I have spent a total of 20 minutes minutes of face-to-face and non-face-to-face time, preparing to see the patient, obtaining and/or reviewing separately obtained history, performing a medically appropriate examination, counseling and educating the patient, documenting clinical information in the electronic health record, and care coordination.     Thank you for allowing me to  participate in the care of this patient.    Barrie Folk, PA-C Department of Hematology/Oncology [facility] at [facility] Phone: [phone]  [phone]    12/06/2021 11:27 AM

## 2021-12-06 NOTE — Patient Instructions (Signed)
-  Prescription sent to the pharmacy for nystatin ointment.  Apply this to the rash.  You can continue to take Benadryl for itching.  If this rash does not improve within 7 days or you have any worsening symptoms call the clinic back for recheck.  Try to keep the area dry as much as possible.

## 2021-12-13 ENCOUNTER — Encounter: Payer: BC Managed Care – PPO | Admitting: Physician Assistant

## 2021-12-14 ENCOUNTER — Inpatient Hospital Stay: Payer: BC Managed Care – PPO | Attending: Hematology and Oncology | Admitting: Physician Assistant

## 2021-12-14 ENCOUNTER — Other Ambulatory Visit: Payer: Self-pay

## 2021-12-14 VITALS — BP 127/75 | HR 93 | Temp 98.2°F | Resp 18 | Ht 63.0 in | Wt 173.6 lb

## 2021-12-14 DIAGNOSIS — Z79811 Long term (current) use of aromatase inhibitors: Secondary | ICD-10-CM | POA: Insufficient documentation

## 2021-12-14 DIAGNOSIS — L299 Pruritus, unspecified: Secondary | ICD-10-CM | POA: Insufficient documentation

## 2021-12-14 DIAGNOSIS — Z923 Personal history of irradiation: Secondary | ICD-10-CM | POA: Diagnosis not present

## 2021-12-14 DIAGNOSIS — C50512 Malignant neoplasm of lower-outer quadrant of left female breast: Secondary | ICD-10-CM | POA: Insufficient documentation

## 2021-12-14 DIAGNOSIS — Z170 Estrogen receptor positive status [ER+]: Secondary | ICD-10-CM | POA: Diagnosis not present

## 2021-12-14 DIAGNOSIS — R21 Rash and other nonspecific skin eruption: Secondary | ICD-10-CM | POA: Insufficient documentation

## 2021-12-14 MED ORDER — HYDROCORTISONE 2.5 % EX CREA: TOPICAL_CREAM | Freq: Two times a day (BID) | CUTANEOUS | 0 refills | Status: DC

## 2021-12-14 NOTE — Progress Notes (Signed)
Symptom Management Consult note [place]    Patient Care Team: [name], MD as PCP - General (Family Medicine) Rockwell Germany, RN as Oncology Nurse Navigator Mauro Kaufmann, RN as Oncology Nurse Navigator Jovita Kussmaul, MD as Consulting Physician (General Surgery) Nicholas Lose, MD as Consulting Physician (Hematology and Oncology) Gery Pray, MD as Consulting Physician (Radiation Oncology)    Name of the patient: Joyce Bennett  [id number]  01-Aug-1956   Date of visit: 12/14/2021    Chief complaint/ Reason for visit- rash and itching  Oncology History  Malignant neoplasm of lower-outer quadrant of left breast of female, estrogen receptor positive ([place])  08/03/2021 Initial Diagnosis   Left diagnostic mammogram: suspicious calcifications in the lower outer quadrant of the left breast. 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    08/09/2021 Cancer Staging   Staging form: Breast, AJCC 8th Edition - Clinical stage from 08/09/2021: Stage IA (cT40m, cN0, cM0, G2, ER+, PR-, HER2-) - Signed by GNicholas Lose MD on 08/09/2021 Stage prefix: Initial diagnosis Histologic grading system: 3 grade system    08/18/2021 Genetic Testing   Negative hereditary cancer genetic testing: no pathogenic variants detected in [facility] BRCAPlus Panel or [facility] CancerNext-Expanded +RNAinsight Panel.  The report dates are August 18, 2021 and August 25, 2021, respectively.   The BRCAplus panel offered by [facility] includes sequencing and deletion/duplication analysis for the following 8 genes: ATM, BRCA1, BRCA2, CDH1, CHEK2, PALB2, PTEN, and TP53.  The [facility]Expanded gene panel [facility] by [facility] includes sequencing, rearrangement, and RNA analysis for the following 77 genes: AIP, ALK, APC, ATM, AXIN2, BAP1, BARD1, BLM, BMPR1A, BRCA1, BRCA2, BRIP1, CDC73, CDH1, CDK4, CDKN1B, CDKN2A, CHEK2, CTNNA1, DICER1, FANCC, FH,  FLCN, GALNT12, KIF1B, LZTR1, MAX, MEN1, MET, MLH1, MSH2, MSH3, MSH6, MUTYH, NBN, NF1, NF2, NTHL1, PALB2, PHOX2B, PMS2, POT1, PRKAR1A, PTCH1, PTEN, RAD51C, RAD51D, RB1, RECQL, RET, SDHA, SDHAF2, SDHB, SDHC, SDHD, SMAD4, SMARCA4, SMARCB1, SMARCE1, STK11, SUFU, TMEM127, TP53, TSC1, TSC2, VHL and XRCC2 (sequencing and deletion/duplication); EGFR, EGLN1, HOXB13, KIT, MITF, PDGFRA, POLD1, and POLE (sequencing only); EPCAM and GREM1 (deletion/duplication only).    09/01/2021 Surgery   Left lumpectomy: Focal residual DCIS high-grade, no residual invasive ductal carcinoma, resection margins negative, 0/4 lymph nodes negative, tumor size: 0.1 cm, ER 95%, PR 0%, HER2 1+, Ki-67 50%     Current Therapy: Letrozole 2.5 mg daily  Interval history- Joyce Bennett a 66yo female with oncologic history as listed [facility] of rash and itching x 1 week. Rash is under left breast. She was seen here in clinic 12/06/21 and prescribed topical Nystatin. She has been using that as prescribed and reports the rash has resolved. She tried wearing a bra on Wednesday and felt like it irritated the skin under her left breast. She has not worn a bra since. She states when rash first started she had terrible itching under left breast that was 10/10 in severity. She has been taking benadryl as needed and itching has improved. She states itching has improved and is only present at night now. She scratched the area with a tissue and was concerned she broke the skin. She applied Neosporin last night and the itching improved. She denies any bleeding or drainage from the area. She has not used any new cream or lotions. She denies fever, chills, shortness of breath, chest pain, abdominal pain, nausea, emesis.  She finished  radiation to left breast 11/17/21. She admits to some peeling skin on her left breast that she applies the cream prescribed by radiation oncology. She says the peeling is  improving.   ROS  All other systems are reviewed and are negative for acute change except as noted in the HPI.    Allergies  Allergen Reactions   Keflex [Cephalexin]    Shellfish Allergy Swelling     Past Medical History:  Diagnosis Date   Breast cancer ([employer])    Family history of breast cancer 08/10/2021   Family history of prostate cancer 08/10/2021   Multiple sclerosis (H[facility])    Neurogenic bladder    Optic neuritis, left    Restless leg syndrome    Urinary tract infection    Recurrent     Past Surgical History:  Procedure Laterality Date   BREAST LUMPECTOMY WITH RADIOACTIVE SEED AND SENTINEL LYMPH NODE BIOPSY Left 09/01/2021   Procedure: LEFT BREAST LUMPECTOMY WITH RADIOACTIVE SEED AND SENTINEL LYMPH NODE BIOPSY;  Surgeon: Jovita Kussmaul, MD;  Location: [facility];  Service: General;  Laterality: Left;   FOOT SURGERY N/A    HAND SURGERY Right     Social History   Socioeconomic History   Marital status: Married    Spouse name: Not on file   Number of children: 1   Years of education: ASSOCIATE   Highest education level: Not on file  Occupational History   Occupation: International aid/development worker: [employer]  Tobacco Use   Smoking status: Never   Smokeless tobacco: Never  Substance and Sexual Activity   Alcohol use: Yes    Comment: rare   Drug use: No   Sexual activity: Not on file  Other Topics Concern   Not on file  Social History Narrative   Patient is married with 1 child.   Patient is right handed.   Patient has a Associate's degree.   Patient drinks 5 cups daily.   Social Determinants of Health   Financial Resource Strain: Not on file  Food Insecurity: Not on file  Transportation Needs: Not on file  Physical Activity: Not on file  Stress: Not on file  Social Connections: Not on file  Intimate Partner Violence: Not on file    Family History  Problem Relation Age of Onset   Stomach cancer Sister        dx 63s    Breast cancer Maternal Aunt        dx after 70, x2 maternal aunts   Prostate cancer Maternal Uncle        dx 64s   Heart disease Maternal Grandmother    Breast cancer Paternal Grandmother        dx after 10   Multiple sclerosis Neg Hx      Current Outpatient Medications:    hydrocortisone 2.5 % cream, Apply topically 2 (two) times daily., Disp: 30 g, Rfl: 0   BETASERON 0.3 MG KIT injection, ADD 1.2 ML DILUENT TO VIAL AND MIX GENTLY. INJECT 1 ML SUBCUTANEOUSLY EVERY OTHER DAY. USE WITHIN 3 HOURS OF MIXING. STORE AT ROOM TEMPERATURE., Disp: 42 kit, Rfl: 5   CALCIUM PO, Take by mouth daily., Disp: , Rfl:    Chlorpheniramine Maleate (ALLERGY PO), Take by mouth., Disp: , Rfl:    cholecalciferol (VITAMIN D) 1000 UNITS tablet, Take 1,000 Units by mouth daily., Disp: , Rfl:    COLLAGEN PO, Take 2 each by mouth daily., Disp: , Rfl:  letrozole (FEMARA) 2.5 MG tablet, Take 1 tablet (2.5 mg total) by mouth daily., Disp: 90 tablet, Rfl: 3   Multiple Vitamin (MULTIVITAMIN) tablet, Take 1 tablet by mouth daily., Disp: , Rfl:    Multiple Vitamins-Minerals (ZINC PO), Take 1 tablet by mouth daily., Disp: , Rfl:    nystatin-triamcinolone ointment (MYCOLOG), Apply 1 application topically 2 (two) times daily., Disp: 30 g, Rfl: 0   trimethoprim (TRIMPEX) 100 MG tablet, Take 1 tablet (100 mg total) by mouth at bedtime as needed., Disp: , Rfl:  No current facility-administered medications for this visit.  Facility-Administered Medications Ordered in Other Visits:    gadopentetate dimeglumine (MAGNEVIST) injection 15 mL, 15 mL, Intravenous, Once PRN, Kathrynn Ducking, MD  PHYSICAL EXAM: ECOG FS:1 - Symptomatic but completely ambulatory    Vitals:   12/14/21 1017  BP: 127/75  Pulse: 93  Resp: 18  Temp: 98.2 F (36.8 C)  TempSrc: Oral  SpO2: 98%  Weight: 173 lb 9.6 oz (78.7 kg)  Height: '5\' 3"'  (1.6 m)   Physical Exam Vitals and nursing note reviewed.  Constitutional:      Appearance: She is  well-developed. She is not ill-appearing or toxic-appearing.  HENT:     Head: Normocephalic and atraumatic.     Right Ear: External ear normal.     Left Ear: External ear normal.     Nose: Nose normal.  Eyes:     General: No scleral icterus.       Right eye: No discharge.        Left eye: No discharge.     Conjunctiva/sclera: Conjunctivae normal.  Neck:     Vascular: No JVD.  Cardiovascular:     Rate and Rhythm: Normal rate and regular rhythm.     Pulses: Normal pulses.     Heart sounds: Normal heart sounds.  Pulmonary:     Effort: Pulmonary effort is normal.     Breath sounds: Normal breath sounds.  Chest:  Breasts:    Right: Normal.     Left: Normal. No swelling, bleeding, inverted nipple, nipple discharge or tenderness.     Comments: No rash seen under left breast. No break in the skin. No signs of infection or abscess. Abdominal:     General: There is no distension.  Musculoskeletal:        General: Normal range of motion.     Cervical back: Normal range of motion.  Skin:    General: Skin is warm and dry.  Neurological:     Mental Status: She is oriented to person, place, and time.     GCS: GCS eye subscore is 4. GCS verbal subscore is 5. GCS motor subscore is 6.     Comments: Fluent speech, no facial droop.  Psychiatric:        Behavior: Behavior normal.       LABORATORY DATA: I have reviewed the data as listed CBC Latest Ref Rng & Units 08/09/2021 06/22/2020 06/18/2019  WBC 4.0 - 10.5 K/uL 5.8 5.3 5.0  Hemoglobin 12.0 - 15.0 g/dL 11.3(L) 11.0(L) 11.1  Hematocrit 36.0 - 46.0 % 35.7(L) 35.0 34.3  Platelets 150 - 400 K/uL 323 368 395     CMP Latest Ref Rng & Units 08/09/2021 06/22/2020 06/18/2019  Glucose 70 - 99 mg/dL 80 73 77  BUN 8 - 23 mg/dL '15 18 13  ' Creatinine 0.44 - 1.00 mg/dL 0.84 0.77 0.87  Sodium 135 - 145 mmol/L 141 142 144  Potassium 3.5 - 5.1 mmol/L  4.3 4.9 4.8  Chloride 98 - 111 mmol/L 105 105 103  CO2 22 - 32 mmol/L '27 26 26  ' Calcium 8.9 - 10.3  mg/dL 9.5 9.4 9.3  Total Protein 6.5 - 8.1 g/dL 7.6 6.8 7.1  Total Bilirubin 0.3 - 1.2 mg/dL 0.3 <0.2 0.2  Alkaline Phos 38 - 126 U/L 130(H) 143(H) 127(H)  AST 15 - 41 U/L '24 15 22  ' ALT 0 - 44 U/L '13 8 11       ' RADIOGRAPHIC STUDIES: I have personally reviewed the radiological images as listed and agreed with the findings in the report. No images are attached to the encounter. No results found.   ASSESSMENT & PLAN: Patient is a 66 y.o. female with history of estrogen receptor positive breast cancer of left breast.  #) Rash- Patient well appearing. Non toxic. Rash treated with Nystatin ointment has resolved. No signs of infection.   #)Pruritis- Patient with pruritis localized to under left breast. OTC benadryl is helping and itching is tolerable. Will prescribe hydrocortisone ointment to help relieve symptoms. Pruritis is not generalized or full body. Still have low suspicion this is adverse reaction to Letrozole. Patient knows if symptoms do not improve to call the clinic or see pcp. ED precautions discussed.   Visit Diagnosis: 1. Pruritus      No orders of the defined types were placed in this encounter.   All questions were answered. The patient knows to call the clinic with any problems, questions or concerns. No barriers to learning was detected.  I have spent a total of 15 minutes minutes of face-to-face and non-face-to-face time, preparing to see the patient, obtaining and/or reviewing separately obtained history, performing a medically appropriate examination, counseling and educating the patient,  documenting clinical information in the electronic health record, and care coordination.     Thank you for allowing me to participate in the care of this patient.    Barrie Folk, PA-C Department of Hematology/Oncology [facility] at [facility] Phone: [phone]  [phone]    12/14/2021 11:39 AM

## 2021-12-14 NOTE — Patient Instructions (Signed)
Prescription sent to pharmacy for hydrocortisone cream. This is a steroid cream to help with itching. Continue to take Benadryl to help with itching as well.  Keep the area dry as much as possible.

## 2021-12-20 ENCOUNTER — Encounter: Payer: Self-pay | Admitting: Radiology

## 2021-12-22 ENCOUNTER — Telehealth: Payer: Self-pay | Admitting: *Deleted

## 2021-12-22 NOTE — Telephone Encounter (Signed)
RETURNED PATIENT'S PHONE CALL, SPOKE WITH PATIENT, RESCHEDULED FU ON 12-28-21 TO 01-01-22 PER PATIENT REQUEST

## 2021-12-28 ENCOUNTER — Ambulatory Visit: Payer: BC Managed Care – PPO | Admitting: Radiation Oncology

## 2021-12-31 NOTE — Progress Notes (Incomplete)
Radiation Oncology         [phone] ________________________________  Patient Name: Joyce Bennett MRN: [id number] DOB: [birth year] Referring Physician: Lurline Del (Profile Not Attached) Date of Service: 11/17/2021 [facility], [place]                                                        End Of Treatment Note  Diagnoses: C50.512-Malignant neoplasm of lower-outer quadrant of left female breast Z17.0-Estrogen receptor positive status [ER+]  Cancer Staging: Status post left lumpectomy: Stage IA (cT39m, cN0, cM0) Left Breast LOQ, Focal-residual high-grade DCIS, with no residual invasive ductal carcinoma, ER+ / PR- / Her2-  Intent: Curative  Radiation Treatment Dates: 10/16/2021 through 11/17/2021 Site Technique Total Dose (Gy) Dose per Fx (Gy) Completed Fx Beam Energies  Breast, Left: Breast_Lt 3D 40.05/40.05 2.67 15/15 10X  Breast, Left: Breast_Lt_Bst 3D 12/12 2 6/6 6X, 10X   Narrative: The patient tolerated radiation therapy relatively well. During her final weekly treatment check on 11/14/21, the patient reported mild fatigue, lymphedema, issues with ROM, and hyperpigmentation.   On physical exam: the left breast area shows some swelling as well as hyperpigmentation changes.  No skin breakdown appreciated. She is using lotion to areas of hyperpigmentation. Overall, she tolerated he treatments well.   Plan: The patient will follow-up with radiation oncology in one month .  ________________________________________________ -----------------------------------  JBlair Promise PhD, MD  This document serves as a record of services personally performed by JGery Pray MD. It was created on his behalf by ERoney Mans a trained medical scribe. The creation of this record is based on the scribe's personal observations and the provider's statements to them. This document has been checked and approved by the attending provider.

## 2021-12-31 NOTE — Progress Notes (Signed)
Radiation Oncology         [phone] ________________________________  Name: Joyce Bennett MRN: [id number]  Date: 01/01/2022  DOB: 1955-12-13  Follow-Up Visit Note  CC: Joyce Croak, MD  Bennett, Joyce Dad, MD    ICD-10-CM   1. Malignant neoplasm of lower-outer quadrant of left breast of female, estrogen receptor positive ([place])  C50.512    Z17.0       Diagnosis:  Status post left lumpectomy: Stage IA (cT40m, cN0, cM0) Left Breast LOQ, Focal-residual high-grade DCIS, with no residual invasive ductal carcinoma, ER+ / PR- / Her2-  Interval Since Last Radiation: 1 month and 14 days   Intent: Curative  Radiation Treatment Dates: 10/16/2021 through 11/17/2021 Site Technique Total Dose (Gy) Dose per Fx (Gy) Completed Fx Beam Energies  Breast, Left: Breast_Lt 3D 40.05/40.05 2.67 15/15 10X  Breast, Left: Breast_Lt_Bst 3D 12/12 2 6/6 6X, 10X    Narrative:  The patient returns today for routine follow-up. The patient tolerated radiation therapy relatively well. During her final weekly treatment check on 11/14/21, the patient reported mild fatigue, lymphedema, issues with ROM, and hyperpigmentation. Physical exam performed on the date of her final weekly treatment check revealed the left breast area to show some swelling as well as hyperpigmentation changes.  No skin breakdown was appreciated. She was sent home with lotion to use to the areas of hyperpigmentation.  Since completing RT, the patient followed up with Dr. GLindi Bennett 11/27/21 to discuss antiestrogen therapy. Following discussion of the risks and benefits, the patient consented to starting antiestrogen treatment consisting of letrozole. Planned treatment duration is for 5 years.  On 12/06/21, the patient presented to Joyce Bennett with the chief complaint of an itchy rash located underneath her left breast for 1 week. The patient noted that the rash recently spread towards the middle of her chest, and that she  stopped wearing a bra when the rash first started to try to let the area heal. The patient also indicated that the rash first appeared around the same time that she started taking letrozole, and reported applying over-the-counter hydrocortisone cream to the area and taking Benadryl without much improvement. Physical exam performed noted a red, papular rash under the left breast. No oozing, purulent drainage, bleeding, skin sloughing, surrounding erythema, or palpable fluctuance was appreciated. Following evaluation, a yeast infection was suspected and the patient was accordingly prescribed topical Nystatin and advised to continue taking OTC benadryl.   During follow up with Joyce Bennett on 12/14/21, the patient reported resolution of the rash with ongoing mild itching. She also reported some ongoing peeling on her left breast from radiation, which has improved with use of the prescribed cream given to her following RT.   Today she reports some ongoing itching in the inframammary fold of the left breast.  She denies any fever or chills.  Her energy level is improving.  She denies any nipple discharge or bleeding.  She has started Femara and other than some itching in the left inframammary fold at the time this medication started she denies any other issues.   Allergies:  is allergic to keflex [cephalexin] and shellfish allergy.  Meds: Current Outpatient Medications  Medication Sig Dispense Refill   BETASERON 0.3 MG KIT injection ADD 1.2 ML DILUENT TO VIAL AND MIX GENTLY. INJECT 1 ML SUBCUTANEOUSLY EVERY OTHER DAY. USE WITHIN 3 HOURS OF MIXING. STORE AT ROOM TEMPERATURE. 42 kit 5   CALCIUM PO Take by mouth daily.  Chlorpheniramine Maleate (ALLERGY PO) Take by mouth.     cholecalciferol (VITAMIN D) 1000 UNITS tablet Take 1,000 Units by mouth daily.     COLLAGEN PO Take 2 each by mouth daily.     hydrocortisone 2.5 % cream Apply topically 2 (two) times daily. 30 g 0   letrozole (FEMARA)  2.5 MG tablet Take 1 tablet (2.5 mg total) by mouth daily. 90 tablet 3   Multiple Vitamin (MULTIVITAMIN) tablet Take 1 tablet by mouth daily.     Multiple Vitamins-Minerals (ZINC PO) Take 1 tablet by mouth daily.     nystatin-triamcinolone ointment (MYCOLOG) Apply 1 application topically 2 (two) times daily. 30 g 0   OXYBUTYNIN CHLORIDE PO Oxybutynin     trimethoprim (TRIMPEX) 100 MG tablet Take 1 tablet (100 mg total) by mouth at bedtime as needed.     No current facility-administered medications for this encounter.   Facility-Administered Medications Ordered in Other Encounters  Medication Dose Route Frequency Provider Last Rate Last Admin   gadopentetate dimeglumine (MAGNEVIST) injection 15 mL  15 mL Intravenous Once PRN Joyce Ducking, MD        Physical Findings: The patient is in no acute distress. Patient is alert and oriented.  height is _0  (1.6 m) and weight is 173 lb 12.8 oz (78.8 kg). Her temperature is 97.6 F (36.4 C). Her blood pressure is 127/90 and her pulse is 88. Her respiration is 20 and oxygen saturation is 100%. .  No significant changes. Lungs are clear to auscultation bilaterally. Heart has regular rate and rhythm. No palpable cervical, supraclavicular, or axillary adenopathy. Abdomen soft, non-tender, normal bowel sounds.  Left breast: no palpable mass, nipple discharge or bleeding.  Mild hyperpigmentation changes.  Skin is well-healed.  No significant rashes noted in the left inframammary fold or right inframammary fold.  Some small pinpoint papules are noted without any drainage.  No significant erythema or skin breakdown.    Lab Findings: Lab Results  Component Value Date   WBC 5.8 08/09/2021   HGB 11.3 (L) 08/09/2021   HCT 35.7 (L) 08/09/2021   MCV 91.1 08/09/2021   PLT 323 08/09/2021    Radiographic Findings: No results found.  Impression:  Status post left lumpectomy: Stage IA (cT68m, cN0, cM0) Left Breast LOQ, Focal-residual high-grade DCIS,  with no residual invasive ductal carcinoma, ER+ / PR- / Her2-  The patient is recovering from the effects of radiation.  She has recovered well from radiation therapy.  As above she continues to have some itching in the inframammary fold which was related to a yeast infection.  No evidence of recurrence on clinical exam today.  Plan: As needed follow-up in radiation oncology.  She will continue close follow-up with medical oncology and continue on adjuvant hormonal therapy.  Today the patient was given some antifungal powder to place in the inframammary fold to see if this will help with her symptoms.  If this does not help helps or worsens she will call back.   ____________________________________  JBlair Promise PhD, MD  This document serves as a record of services personally performed by JGery Pray MD. It was created on his behalf by ERoney Mans a trained medical scribe. The creation of this record is based on the scribe's personal observations and the provider's statements to them. This document has been checked and approved by the attending provider.

## 2022-01-01 ENCOUNTER — Other Ambulatory Visit: Payer: Self-pay

## 2022-01-01 ENCOUNTER — Encounter: Payer: Self-pay | Admitting: Radiation Oncology

## 2022-01-01 ENCOUNTER — Ambulatory Visit
Admission: RE | Admit: 2022-01-01 | Discharge: 2022-01-01 | Disposition: A | Discharge status: Home / Self Care | Payer: BC Managed Care – PPO | Source: Ambulatory Visit | Attending: Radiation Oncology | Admitting: Radiation Oncology

## 2022-01-01 ENCOUNTER — Telehealth: Payer: Self-pay | Admitting: *Deleted

## 2022-01-01 VITALS — BP 127/90 | HR 88 | Temp 97.6°F | Resp 20 | Ht 63.0 in | Wt 173.8 lb

## 2022-01-01 DIAGNOSIS — C50512 Malignant neoplasm of lower-outer quadrant of left female breast: Secondary | ICD-10-CM | POA: Diagnosis present

## 2022-01-01 DIAGNOSIS — Z170 Estrogen receptor positive status [ER+]: Secondary | ICD-10-CM | POA: Diagnosis not present

## 2022-01-01 DIAGNOSIS — Z923 Personal history of irradiation: Secondary | ICD-10-CM | POA: Diagnosis not present

## 2022-01-01 DIAGNOSIS — R238 Other skin changes: Secondary | ICD-10-CM | POA: Diagnosis not present

## 2022-01-01 DIAGNOSIS — Z79811 Long term (current) use of aromatase inhibitors: Secondary | ICD-10-CM | POA: Diagnosis not present

## 2022-01-01 DIAGNOSIS — Z79899 Other long term (current) drug therapy: Secondary | ICD-10-CM | POA: Diagnosis not present

## 2022-01-01 NOTE — Progress Notes (Signed)
Joyce Bennett is here today for follow up post radiation to the breast.   Breast Side:left   They completed their radiation on: 11/17/2021   Does the patient complain of any of the following: Post radiation skin issues: itching underneath left breast Breast Tenderness: denies Breast Swelling: denies Lymphadema: denies Range of Motion limitations: demonstrates full range of motion Fatigue post radiation: mild fatigue Appetite good/fair/poor: good  Additional comments if applicable: none  Vitals:   01/01/22 1035  BP: 127/90  Pulse: 88  Resp: 20  Temp: 97.6 F (36.4 C)  SpO2: 100%  Weight: 173 lb 12.8 oz (78.8 kg)  Height: 5\' 3"  (1.6 m)

## 2022-01-01 NOTE — Telephone Encounter (Signed)
Received call from pt with complaint of ongoing rash and pruritus under left breast.  Pt states rash began shortly after starting Letrozole.  Per MD pt to stop letrozole x 2 week and follow up in clinic to see if symptoms resolve.  Pt states she will continue to apply Nystatin ointment/powder as needed and will follow up in clinic.

## 2022-01-02 ENCOUNTER — Telehealth: Payer: Self-pay | Admitting: Radiology

## 2022-01-02 NOTE — Telephone Encounter (Signed)
Patient states the powder she was given for itching caused a rash. She asks if there is something else she can use or do.

## 2022-01-03 NOTE — Telephone Encounter (Signed)
Per Dr Sondra Come, advised patient to stop using all powders and creams. If skin irritation doesn't improve she will need to be evaluated and may need a dermatology referral. Patient verbalized understanding.

## 2022-01-05 NOTE — Telephone Encounter (Signed)
Patient states itching has resolved after stopping a chemotherapy drug. Advised patient to call back if needed.

## 2022-01-11 ENCOUNTER — Telehealth: Payer: Self-pay | Admitting: Radiology

## 2022-01-11 ENCOUNTER — Telehealth: Payer: Self-pay

## 2022-01-11 NOTE — Telephone Encounter (Signed)
Called to make patient aware that MD suggested she uses corn starch powder or baby powder to moist area to skin. Patient did not answer, left voicemail.

## 2022-01-11 NOTE — Telephone Encounter (Signed)
Patient states pruritis has returned at night despite stopping creams and powders. She thinks it is due to moisture. She asks what can be done for this and can she use baking powder.

## 2022-01-11 NOTE — Telephone Encounter (Signed)
error 

## 2022-01-15 ENCOUNTER — Encounter: Payer: Self-pay | Admitting: Adult Health

## 2022-01-15 ENCOUNTER — Inpatient Hospital Stay: Payer: BC Managed Care – PPO | Attending: Hematology and Oncology | Admitting: Adult Health

## 2022-01-15 ENCOUNTER — Other Ambulatory Visit: Payer: Self-pay

## 2022-01-15 VITALS — BP 141/82 | HR 94 | Temp 97.8°F | Resp 18 | Ht 63.0 in | Wt 173.9 lb

## 2022-01-15 DIAGNOSIS — Z170 Estrogen receptor positive status [ER+]: Secondary | ICD-10-CM | POA: Diagnosis not present

## 2022-01-15 DIAGNOSIS — Z79899 Other long term (current) drug therapy: Secondary | ICD-10-CM | POA: Diagnosis not present

## 2022-01-15 DIAGNOSIS — Z800 Family history of malignant neoplasm of digestive organs: Secondary | ICD-10-CM | POA: Diagnosis not present

## 2022-01-15 DIAGNOSIS — R21 Rash and other nonspecific skin eruption: Secondary | ICD-10-CM | POA: Diagnosis present

## 2022-01-15 DIAGNOSIS — Z8042 Family history of malignant neoplasm of prostate: Secondary | ICD-10-CM | POA: Diagnosis not present

## 2022-01-15 DIAGNOSIS — L299 Pruritus, unspecified: Secondary | ICD-10-CM | POA: Diagnosis present

## 2022-01-15 DIAGNOSIS — Z87440 Personal history of urinary (tract) infections: Secondary | ICD-10-CM | POA: Diagnosis not present

## 2022-01-15 DIAGNOSIS — C50512 Malignant neoplasm of lower-outer quadrant of left female breast: Secondary | ICD-10-CM

## 2022-01-15 DIAGNOSIS — Z8249 Family history of ischemic heart disease and other diseases of the circulatory system: Secondary | ICD-10-CM | POA: Insufficient documentation

## 2022-01-15 DIAGNOSIS — Z803 Family history of malignant neoplasm of breast: Secondary | ICD-10-CM | POA: Insufficient documentation

## 2022-01-15 MED ORDER — ANASTROZOLE 1 MG PO TABS: 1.0000 mg | ORAL_TABLET | Freq: Every day | ORAL | 3 refills | Status: DC

## 2022-01-15 NOTE — Patient Instructions (Signed)
Anastrozole tablets What is this medication? ANASTROZOLE (an AS troe zole) is used to treat breast cancer in women who have gone through menopause. Some types of breast cancer depend on estrogen to grow, and this medicine can stop tumor growth by blocking estrogen production. This medicine may be used for other purposes; ask your health care provider or pharmacist if you have questions. COMMON BRAND NAME(S): Arimidex What should I tell my care team before I take this medication? They need to know if you have any of these conditions: bone problems heart disease high cholesterol an unusual or allergic reaction to anastrozole, other medicines, foods, dyes, or preservatives pregnant or trying to get pregnant breast-feeding How should I use this medication? Take this medicine by mouth with a glass of water. Follow the directions on the prescription label. You can take it with or without food. If it upsets your stomach, take it with food. Take your medicine at regular intervals. Do not take it more often than directed. Do not stop taking except on your doctor's advice. Talk to your pediatrician regarding the use of this medicine in children. Special care may be needed. Overdosage: If you think you have taken too much of this medicine contact a poison control center or emergency room at once. NOTE: This medicine is only for you. Do not share this medicine with others. What if I miss a dose? If you miss a dose, take it as soon as you can. If it is almost time for your next dose, take only that dose. Do not take double or extra doses. What may interact with this medication? This medicine may interact with the following medications: female hormones, like estrogens or progestins and birth control pills, patches, rings, or injections tamoxifen This list may not describe all possible interactions. Give your health care provider a list of all the medicines, herbs, non-prescription drugs, or dietary  supplements you use. Also tell them if you smoke, drink alcohol, or use illegal drugs. Some items may interact with your medicine. What should I watch for while using this medication? Visit your doctor or health care professional for regular checks on your progress. Let your doctor or health care professional know about any unusual vaginal bleeding. Do not become pregnant while taking this medicine or for at least 3 weeks after stopping it. Women should inform their doctor if they wish to become pregnant or think they might be pregnant. There is a potential for serious side effects to an unborn child. Talk to your health care professional or pharmacist for more information. Do not breast-feed an infant while taking this medicine or for 2 weeks after stopping it. This medicine may interfere with the ability to have a child. Talk with your doctor or health care professional if you are concerned about your fertility. Using this medicine for a long time may increase your risk of low bone mass. Talk to your doctor about bone health. You should make sure that you get enough calcium and vitamin D while you are taking this medicine. Discuss the foods you eat and the vitamins you take with your health care professional. What side effects may I notice from receiving this medication? Side effects that you should report to your doctor or health care professional as soon as possible: allergic reactions like skin rash, itching or hives, swelling of the face, lips, or tongue signs and symptoms of a blood clot such as breathing problems; changes in vision; chest pain; sudden headache; pain, swelling, warmth in  the leg; trouble speaking; sudden numbness or weakness of the face, arm, or leg signs and symptoms of infection like fever or chills; cough; sore throat; pain or trouble passing urine Side effects that usually do not require medical attention (report to your doctor or health care professional if they continue or  are bothersome): bone pain dizziness hair loss headache hot flashes joint pain muscle pain signs of decreased red blood cells - unusually weak or tired, feeling faint or lightheaded, falls vaginal discharge, itching, or odor in women This list may not describe all possible side effects. Call your doctor for medical advice about side effects. You may report side effects to FDA at 1-800-FDA-1088. Where should I keep my medication? Keep out of the reach of children. Store at room temperature between 20 and 25 degrees C (68 and 77 degrees F). Throw away any unused medicine after the expiration date. NOTE: This sheet is a summary. It may not cover all possible information. If you have questions about this medicine, talk to your doctor, pharmacist, or health care provider.  2022 Elsevier/Gold Standard (2017-12-13 00:00:00)

## 2022-01-15 NOTE — Progress Notes (Signed)
[facility]:    Joyce Bennett, [place] [address]   DIAGNOSIS:  Cancer Staging  Malignant neoplasm of lower-outer quadrant of left breast of female, estrogen receptor positive ([place]) Staging form: Breast, AJCC 8th Edition - Clinical stage from 08/09/2021: Stage IA (cT85m, cN0, cM0, G2, ER+, PR-, HER2-) - Signed by GNicholas Lose MD on 08/09/2021 Stage prefix: Initial diagnosis Histologic grading system: 3 grade system   SUMMARY OF ONCOLOGIC HISTORY: Oncology History  Malignant neoplasm of lower-outer quadrant of left breast of female, estrogen receptor positive ([facility]  08/03/2021 Initial Diagnosis   Left diagnostic mammogram: suspicious calcifications in the lower outer quadrant of the left breast. 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    08/09/2021 Cancer Staging   Staging form: Breast, AJCC 8th Edition - Clinical stage from 08/09/2021: Stage IA (cT156m cN0, cM0, G2, ER+, PR-, HER2-) - Signed by GuNicholas LoseMD on 08/09/2021 Stage prefix: Initial diagnosis Histologic grading system: 3 grade system    08/18/2021 Genetic Testing   Negative hereditary cancer genetic testing: no pathogenic variants detected in [facility] BRCAPlus Panel or [facility] CancerNext-Expanded +RNAinsight Panel.  The report dates are August 18, 2021 and August 25, 2021, respectively.   The BRCAplus panel offered by [facility] includes sequencing and deletion/duplication analysis for the following 8 genes: ATM, BRCA1, BRCA2, CDH1, CHEK2, PALB2, PTEN, and TP53.  The CancerNext-Expanded gene panel offered by [facility] includes sequencing, rearrangement, and RNA analysis for the following 77 genes: AIP, ALK, APC, ATM, AXIN2, BAP1, BARD1, BLM, BMPR1A, BRCA1, BRCA2, BRIP1, CDC73, CDH1, CDK4, CDKN1B, CDKN2A, CHEK2, CTNNA1, DICER1, FANCC, FH, FLCN, GALNT12, KIF1B, LZTR1, MAX, MEN1, MET, MLH1, MSH2,  MSH3, MSH6, MUTYH, NBN, NF1, NF2, NTHL1, PALB2, PHOX2B, PMS2, POT1, PRKAR1A, PTCH1, PTEN, RAD51C, RAD51D, RB1, RECQL, RET, SDHA, SDHAF2, SDHB, SDHC, SDHD, SMAD4, SMARCA4, SMARCB1, SMARCE1, STK11, SUFU, TMEM127, TP53, TSC1, TSC2, VHL and XRCC2 (sequencing and deletion/duplication); EGFR, EGLN1, HOXB13, KIT, MITF, PDGFRA, POLD1, and POLE (sequencing only); EPCAM and GREM1 (deletion/duplication only).    09/01/2021 Surgery   Left lumpectomy: Focal residual DCIS high-grade, no residual invasive ductal carcinoma, resection margins negative, 0/4 lymph nodes negative, tumor size: 0.1 cm, ER 95%, PR 0%, HER2 1+, Ki-67 50%     CURRENT THERAPY: Letrozole  INTERVAL HISTORY: Joyce Bennett.o. female returns for evaluation of previous left breast rash that began shortly after starting letrozole.  She was instructed to stop the letrozole for 2 weeks and follow-up.  She says that the itching and rash has improved since stopping the letrozole and she has been using some triamcinolone cream which has helped.  She says that the other creams she was prescribed were too strong.     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  MEDICAL HISTORY: Past Medical History:  Diagnosis Date   [facility]   Family history of breast cancer 08/10/2021   Family history of prostate cancer 08/10/2021   History of radiation therapy    left breast - 10/16/2021 - 11/17/2021 -  Dr Gery Pray   Multiple sclerosis [facility])    Neurogenic bladder    Optic neuritis, left    Restless leg syndrome    Urinary tract infection     Recurrent    SURGICAL HISTORY: Past Surgical History:  Procedure Laterality Date   BREAST LUMPECTOMY WITH RADIOACTIVE SEED AND SENTINEL LYMPH NODE BIOPSY Left 09/01/2021   Procedure: LEFT BREAST LUMPECTOMY WITH RADIOACTIVE SEED AND SENTINEL LYMPH NODE BIOPSY;  Surgeon: Jovita Kussmaul, MD;  Location: [place];  Service: General;  Laterality: Left;   FOOT SURGERY N/A    HAND SURGERY Right     SOCIAL HISTORY: Social History   Socioeconomic History   Marital status: Married    Spouse name: Not on file   Number of children: 1   Years of education: ASSOCIATE   Highest education level: Not on file  Occupational History   Occupation: International aid/development worker: [employer]  Tobacco Use   Smoking status: Never   Smokeless tobacco: Never  Substance and Sexual Activity   Alcohol use: Yes    Comment: rare   Drug use: No   Sexual activity: Not on file  Other Topics Concern   Not on file  Social History Narrative   Patient is married with 1 child.   Patient is right handed.   Patient has a Associate's degree.   Patient drinks 5 cups daily.   Social Determinants of Health   Financial Resource Strain: Not on file  Food Insecurity: Not on file  Transportation Needs: Not on file  Physical Activity: Not on file  Stress: Not on file  Social Connections: Not on file  Intimate Partner Violence: Not on file    FAMILY HISTORY: Family History  Problem Relation Age of Onset   Stomach cancer Sister        dx 43s   Breast cancer Maternal Aunt        dx after 61, x2 maternal aunts   Prostate cancer Maternal Uncle        dx 61s   Heart disease Maternal Grandmother    Breast cancer Paternal Grandmother        dx after 41   Multiple sclerosis Neg Hx     Review of Systems  Constitutional:  Negative for appetite change, chills, fatigue, fever and unexpected weight change.  HENT:   Negative for hearing loss, lump/mass and trouble swallowing.   Eyes:  Negative  for eye problems and icterus.  Respiratory:  Negative for chest tightness, cough and shortness of breath.   Cardiovascular:  Negative for chest pain, leg swelling and palpitations.  Gastrointestinal:  Negative for abdominal distention, abdominal pain, constipation, diarrhea, nausea and vomiting.  Endocrine: Negative for hot flashes.  Genitourinary:  Negative for difficulty urinating.   Musculoskeletal:  Negative for arthralgias.  Skin:  Negative for itching and rash.  Neurological:  Negative for dizziness, extremity weakness, headaches and numbness.  Hematological:  Negative for adenopathy. Does not bruise/bleed easily.  Psychiatric/Behavioral:  Negative for depression. The patient is not nervous/anxious.      PHYSICAL EXAMINATION  ECOG PERFORMANCE STATUS: 1 - Symptomatic but completely ambulatory  Vitals:   01/15/22 1018  BP: (!) 141/82  Pulse: 94  Resp: 18  Temp: 97.8 F (36.6 C)  SpO2: 100%    Physical Exam Constitutional:      General: She is not in acute distress.    Appearance: Normal appearance. She is not toxic-appearing.  HENT:  Head: Normocephalic and atraumatic.  Eyes:     General: No scleral icterus. Cardiovascular:     Rate and Rhythm: Normal rate and regular rhythm.     Pulses: Normal pulses.     Heart sounds: Normal heart sounds.  Pulmonary:     Effort: Pulmonary effort is normal.     Breath sounds: Normal breath sounds.  Chest:     Comments: Left breast rash resolved, no rash, erythema or skin lesion Abdominal:     General: Abdomen is flat. Bowel sounds are normal. There is no distension.     Palpations: Abdomen is soft.     Tenderness: There is no abdominal tenderness.  Musculoskeletal:        General: No swelling.     Cervical back: Neck supple.  Lymphadenopathy:     Cervical: No cervical adenopathy.  Skin:    General: Skin is warm and dry.     Findings: No rash.  Neurological:     General: No focal deficit present.     Mental Status: She  is alert.  Psychiatric:        Mood and Affect: Mood normal.        Behavior: Behavior normal.    LABORATORY DATA:  CBC    Component Value Date/Time   WBC 5.8 08/09/2021 1243   WBC 6.0 02/12/2018 0256   RBC 3.92 08/09/2021 1243   HGB 11.3 (L) 08/09/2021 1243   HGB 11.0 (L) 06/22/2020 1319   HCT 35.7 (L) 08/09/2021 1243   HCT 35.0 06/22/2020 1319   PLT 323 08/09/2021 1243   PLT 368 06/22/2020 1319   MCV 91.1 08/09/2021 1243   MCV 90 06/22/2020 1319   MCH 28.8 08/09/2021 1243   MCHC 31.7 08/09/2021 1243   RDW 13.3 08/09/2021 1243   RDW 12.8 06/22/2020 1319   LYMPHSABS 2.2 08/09/2021 1243   LYMPHSABS 2.2 06/22/2020 1319   MONOABS 0.7 08/09/2021 1243   EOSABS 0.2 08/09/2021 1243   EOSABS 0.1 06/22/2020 1319   BASOSABS 0.0 08/09/2021 1243   BASOSABS 0.0 06/22/2020 1319    CMP     Component Value Date/Time   NA 141 08/09/2021 1243   NA 142 06/22/2020 1319   K 4.3 08/09/2021 1243   CL 105 08/09/2021 1243   CO2 27 08/09/2021 1243   GLUCOSE 80 08/09/2021 1243   BUN 15 08/09/2021 1243   BUN 18 06/22/2020 1319   CREATININE 0.84 08/09/2021 1243   CALCIUM 9.5 08/09/2021 1243   PROT 7.6 08/09/2021 1243   PROT 6.8 06/22/2020 1319   ALBUMIN 3.8 08/09/2021 1243   ALBUMIN 4.1 06/22/2020 1319   AST 24 08/09/2021 1243   ALT 13 08/09/2021 1243   ALKPHOS 130 (H) 08/09/2021 1243   BILITOT 0.3 08/09/2021 1243   GFRNONAA >60 08/09/2021 1243   GFRAA 96 06/22/2020 1319       ASSESSMENT and THERAPY PLAN:   Malignant neoplasm of lower-outer quadrant of left breast of female, estrogen receptor positive ([place]) 09/01/2021:Left lumpectomy: Focal residual DCIS high-grade, no residual invasive ductal carcinoma, resection margins negative, 0/4 lymph nodes negative, tumor size: 0.1 cm, ER 95%, PR 0%, HER2 1+, Ki-67 50%   Treatment plan: 1.  Given the small size of the invasive ductal carcinoma there is no role of Oncotype DX testing. 2. adjuvant radiation therapy completed 11/17/21 3.   Adjuvant antiestrogens with Letrozole for 5 years; changed to anastrozole due to rash   Mercedes and I reviewed her rash and her antiestrogen  therapy.  I am not certain the letrozole caused this rash because it was isolated in one area and not over several areas.  Due to this we will change her oral antiestrogen therapy to Anastrozole.  She and I reviewed the risks and benefits in detail and she verbalized understanding.  She has f/u on 3/21 for her SCP visit and we will touch base about how she is doing at that time.      No orders of the defined types were placed in this encounter.   All questions were answered. The patient knows to call the clinic with any problems, questions or concerns. We can certainly see the patient much sooner if necessary.  Total encounter time: 20 minutes in face to face visit time, chart review, lab review, care coordination, and documentation of the encounter.   Wilber Bihari, NP 01/17/22 8:55 PM Medical Oncology and Hematology [facility] [place], [place] [address] Tel. [phone]    Fax. [phone]  *Total Encounter Time as defined by the Centers for Medicare and Medicaid Services includes, in addition to the face-to-face time of a patient visit (documented in the note above) non-face-to-face time: obtaining and reviewing outside history, ordering and reviewing medications, tests or procedures, care coordination (communications with other health care professionals or caregivers) and documentation in the medical record.

## 2022-01-17 NOTE — Assessment & Plan Note (Signed)
09/01/2021:Left lumpectomy: Focal residual DCIS high-grade, no residual invasive ductal carcinoma, resection margins negative, 0/4 lymph nodes negative, tumor size: 0.1 cm, ER 95%, PR 0%, HER2 1+, Ki-67 50%  Treatment plan: 1.  Given the small size of the invasive ductal carcinoma there is no role of Oncotype DX testing. 2. adjuvant radiation therapy completed 11/17/21 3.  Adjuvant antiestrogens with Letrozole for 5 years; changed to anastrozole due to rash   Joyce Bennett and I reviewed her rash and her antiestrogen therapy.  I am not certain the letrozole caused this rash because it was isolated in one area and not over several areas.  Due to this we will change her oral antiestrogen therapy to Anastrozole.  She and I reviewed the risks and benefits in detail and she verbalized understanding.  She has f/u on 3/21 for her SCP visit and we will touch base about how she is doing at that time.

## 2022-01-18 ENCOUNTER — Ambulatory Visit: Payer: BC Managed Care – PPO | Admitting: Adult Health

## 2022-01-23 ENCOUNTER — Telehealth: Payer: Self-pay | Admitting: *Deleted

## 2022-01-23 NOTE — Telephone Encounter (Signed)
Received fax from [facility] for PA Betaseron.  Completed form to MM/NP for signature.

## 2022-01-24 MED ORDER — BETASERON 0.3 MG ~~LOC~~ KIT: PACK | SUBCUTANEOUS | 5 refills | Status: DC

## 2022-01-24 NOTE — Telephone Encounter (Signed)
Signed [facility] form faxed with fax confirmation received.  Awaiting determination.

## 2022-01-24 NOTE — Addendum Note (Signed)
Addended by: Brandon Melnick on: 01/24/2022 04:25 PM   Modules accepted: Orders

## 2022-01-24 NOTE — Telephone Encounter (Signed)
Received approval for BETASERON for this pt.  [facility] 01-24-2022-01-24-2023.  [facility].  ID [id number].  PA# [id number].

## 2022-01-26 ENCOUNTER — Telehealth: Payer: Self-pay

## 2022-01-26 NOTE — Telephone Encounter (Signed)
Return call to pt, pt states her eyes have been dry since starting the anastrozole.  Per MD d/c anastrozole for 2 weeks to see if symptoms persist or not.  Can try exemestane if needed after the 2 week trial period without meds.  Pt verbalized understanding and thanks.

## 2022-01-31 ENCOUNTER — Telehealth: Payer: Self-pay

## 2022-01-31 NOTE — Telephone Encounter (Signed)
Pt called and states since stopping anastrozole she is still experiencing dry/itchy eyes. Pt states she does not think it was caused by anastrozole now and asks if OK to restart. Per previous note I advised pt to wait 2 weeks as advised by MD then restart if she is sure this is not a cause. Pt verbalizes she is taking antihistamines. Pt will restart med when appropriate.

## 2022-02-08 ENCOUNTER — Other Ambulatory Visit: Payer: Self-pay | Admitting: *Deleted

## 2022-02-08 MED ORDER — EXEMESTANE 25 MG PO TABS: 25.0000 mg | ORAL_TABLET | Freq: Every day | ORAL | 0 refills | Status: DC

## 2022-02-08 NOTE — Progress Notes (Signed)
Received call from pt stating after stopping anastrozole x2 weeks, bilateral eye irritation has resolved.  Verbal orders received from MD for pt to start Exemestane 25 mg p.o daily. Pt educated, verbalized understanding and prescription sent to pharmacy on file.  ?

## 2022-02-23 ENCOUNTER — Telehealth: Payer: Self-pay | Admitting: *Deleted

## 2022-02-27 ENCOUNTER — Encounter: Payer: Self-pay | Admitting: Adult Health

## 2022-02-27 ENCOUNTER — Other Ambulatory Visit: Payer: Self-pay

## 2022-02-27 ENCOUNTER — Inpatient Hospital Stay: Payer: BC Managed Care – PPO | Attending: Hematology and Oncology | Admitting: Adult Health

## 2022-02-27 ENCOUNTER — Other Ambulatory Visit: Payer: Self-pay | Admitting: Hematology and Oncology

## 2022-02-27 VITALS — BP 129/82 | HR 99 | Temp 97.7°F | Resp 18 | Ht 63.0 in | Wt 172.3 lb

## 2022-02-27 DIAGNOSIS — Z79811 Long term (current) use of aromatase inhibitors: Secondary | ICD-10-CM | POA: Insufficient documentation

## 2022-02-27 DIAGNOSIS — Z923 Personal history of irradiation: Secondary | ICD-10-CM | POA: Diagnosis not present

## 2022-02-27 DIAGNOSIS — Z170 Estrogen receptor positive status [ER+]: Secondary | ICD-10-CM | POA: Insufficient documentation

## 2022-02-27 DIAGNOSIS — C50512 Malignant neoplasm of lower-outer quadrant of left female breast: Secondary | ICD-10-CM | POA: Insufficient documentation

## 2022-02-27 NOTE — Progress Notes (Signed)
SURVIVORSHIP VISIT: ? ? ?BRIEF ONCOLOGIC HISTORY:  ?Oncology History  ?Malignant neoplasm of lower-outer quadrant of left breast of female, estrogen receptor positive ([place])  ?08/03/2021 Initial Diagnosis  ? Left diagnostic mammogram: suspicious calcifications in the lower outer quadrant of the left breast. 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  ?  ?08/09/2021 Cancer Staging  ? Staging form: Breast, AJCC 8th Edition ?- Clinical stage from 08/09/2021: Stage IA (cT59m, cN0, cM0, G2, ER+, PR-, HER2-) - Signed by GNicholas Lose MD on 08/09/2021 ?Stage prefix: Initial diagnosis ?Histologic grading system: 3 grade system ? ?  ?08/18/2021 Genetic Testing  ? Negative hereditary cancer genetic testing: no pathogenic variants detected in [facility] BRCAPlus Panel or [facility] CancerNext-Expanded +RNAinsight Panel.  The report dates are August 18, 2021 and August 25, 2021, respectively.  ? ?The BRCAplus panel offered by [facility] includes sequencing and deletion/duplication analysis for the following 8 genes: ATM, BRCA1, BRCA2, CDH1, CHEK2, PALB2, PTEN, and TP53.  The CancerNext[facility]Expanded gene panel [facility] by [facility] includes sequencing, rearrangement, and RNA analysis for the following 77 genes: AIP, ALK, APC, ATM, AXIN2, BAP1, BARD1, BLM, BMPR1A, BRCA1, BRCA2, BRIP1, CDC73, CDH1, CDK4, CDKN1B, CDKN2A, CHEK2, CTNNA1, DICER1, FANCC, FH, FLCN, GALNT12, KIF1B, LZTR1, MAX, MEN1, MET, MLH1, MSH2, MSH3, MSH6, MUTYH, NBN, NF1, NF2, NTHL1, PALB2, PHOX2B, PMS2, POT1, PRKAR1A, PTCH1, PTEN, RAD51C, RAD51D, RB1, RECQL, RET, SDHA, SDHAF2, SDHB, SDHC, SDHD, SMAD4, SMARCA4, SMARCB1, SMARCE1, STK11, SUFU, TMEM127, TP53, TSC1, TSC2, VHL and XRCC2 (sequencing and deletion/duplication); EGFR, EGLN1, HOXB13, KIT, MITF, PDGFRA, POLD1, and POLE (sequencing only); EPCAM and GREM1 (deletion/duplication only).  ?  ?09/01/2021 Surgery  ? Left lumpectomy: Focal residual DCIS high-grade, no residual  invasive ductal carcinoma, resection margins negative, 0/4 lymph nodes negative, tumor size: 0.1 cm, ER 95%, PR 0%, HER2 1+, Ki-67 50% ?  ?10/16/2021 - 11/17/2021 Radiation Therapy  ? Site Technique Total Dose (Gy) Dose per Fx (Gy) Completed Fx Beam Energies  ?Breast, Left: Breast_Lt 3D 40.05/40.05 2.67 15/15 10X  ?Breast, Left: Breast_Lt_Bst 3D 12/12 2 6/6 6X, 10X  ? ?  ?11/27/2021 -  Anti-estrogen oral therapy  ? Started with anastrozole, developed bilateral eye irritation. ?Switched to exemestane 02/08/2022. ?  ? ? ?INTERVAL HISTORY:  ?Ms. Lindfors to review her survivorship care plan detailing her treatment course for breast cancer, as well as monitoring long-term side effects of that treatment, education regarding health maintenance, screening, and overall wellness and health promotion.    ? ?Overall, Ms. CHaggarreports feeling quite well other than some left breast heaviness.  She was experiencing difficulty while taking Letrozole and notes improvement since starting Exemestane.   ? ?REVIEW OF SYSTEMS:  ?Review of Systems  ?Constitutional:  Negative for appetite change, chills, fatigue, fever and unexpected weight change.  ?HENT:   Negative for hearing loss, lump/mass and trouble swallowing.   ?Eyes:  Negative for eye problems and icterus.  ?Respiratory:  Negative for chest tightness, cough and shortness of breath.   ?Cardiovascular:  Negative for chest pain, leg swelling and palpitations.  ?Gastrointestinal:  Negative for abdominal distention, abdominal pain, constipation, diarrhea, nausea and vomiting.  ?Endocrine: Negative for hot flashes.  ?Genitourinary:  Negative for difficulty urinating.   ?Musculoskeletal:  Negative for arthralgias.  ?Skin:  Negative for itching and rash.  ?Neurological:  Negative for dizziness, extremity weakness, headaches and numbness.  ?Hematological:  Negative for adenopathy. Does not bruise/bleed easily.  ?Psychiatric/Behavioral:  Negative for depression. The patient is not  nervous/anxious.   ?  Breast: Denies any new nodularity, masses, tenderness, nipple changes, or nipple discharge.  ? ? ? ? ?ONCOLOGY TREATMENT TEAM:  ?1. Surgeon:  Dr. Marlou Starks at [facility] ?2. Medical Oncologist: Dr. Lindi Adie  ?3. Radiation Oncologist: Dr. Sondra Come ?  ? ?PAST MEDICAL/SURGICAL HISTORY:  ?Past Medical History:  ?Diagnosis Date  ? Breast cancer ([place])   ? Family history of breast cancer 08/10/2021  ? Family history of prostate cancer 08/10/2021  ? History of radiation therapy   ? left breast - 10/16/2021 - 11/17/2021 - Dr Gery Pray  ? Multiple sclerosis ([place])   ? Neurogenic bladder   ? Optic neuritis, left   ? Restless leg syndrome   ? Urinary tract infection   ? Recurrent  ? ?Past Surgical History:  ?Procedure Laterality Date  ? BREAST LUMPECTOMY WITH RADIOACTIVE SEED AND SENTINEL LYMPH NODE BIOPSY Left 09/01/2021  ? Procedure: LEFT BREAST LUMPECTOMY WITH RADIOACTIVE SEED AND SENTINEL LYMPH NODE BIOPSY;  Surgeon: Jovita Kussmaul, MD;  Location: [place];  Service: General;  Laterality: Left;  ? FOOT SURGERY N/A   ? HAND SURGERY Right   ? ? ? ?ALLERGIES:  ?Allergies  ?Allergen Reactions  ? Keflex [Cephalexin]   ? Shellfish Allergy Swelling  ? ? ? ?CURRENT MEDICATIONS:  ?Outpatient Encounter Medications as of 02/27/2022  ?Medication Sig  ? CALCIUM PO Take by mouth daily.  ? Chlorpheniramine Maleate (ALLERGY PO) Take by mouth.  ? COLLAGEN PO Take 2 each by mouth daily.  ? exemestane (AROMASIN) 25 MG tablet Take 1 tablet (25 mg total) by mouth daily after breakfast.  ? fluticasone (FLONASE) 50 MCG/ACT nasal spray Place 2 sprays into both nostrils 2 (two) times daily.  ? Interferon Beta-1b (BETASERON) 0.3 MG KIT injection ADD 1.2 ML DILUENT TO VIAL AND MIX GENTLY. INJECT 1 ML SUBCUTANEOUSLY EVERY OTHER DAY. USE WITHIN 3 HOURS OF MIXING. STORE AT ROOM TEMPERATURE.  ? Multiple Vitamin (MULTIVITAMIN) tablet Take 1 tablet by mouth daily.  ? Multiple Vitamins-Minerals (ZINC PO) Take 1 tablet  by mouth daily.  ? trimethoprim (TRIMPEX) 100 MG tablet Take 1 tablet (100 mg total) by mouth at bedtime as needed.  ? [DISCONTINUED] betamethasone dipropionate 0.05 % cream Apply topically 2 (two) times daily. (Patient not taking: Reported on 02/27/2022)  ? [DISCONTINUED] cholecalciferol (VITAMIN D) 1000 UNITS tablet Take 1,000 Units by mouth daily. (Patient not taking: Reported on 02/27/2022)  ? [DISCONTINUED] OXYBUTYNIN CHLORIDE PO Oxybutynin  ? ?Facility-Administered Encounter Medications as of 02/27/2022  ?Medication  ? gadopentetate dimeglumine (MAGNEVIST) injection 15 mL  ? ? ? ?ONCOLOGIC FAMILY HISTORY:  ?Family History  ?Problem Relation Age of Onset  ? Stomach cancer Sister   ?     dx 35s  ? Breast cancer Maternal Aunt   ?     dx after 19, x2 maternal aunts  ? Prostate cancer Maternal Uncle   ?     dx 39s  ? Heart disease Maternal Grandmother   ? Breast cancer Paternal Grandmother   ?     dx after 62  ? Multiple sclerosis Neg Hx   ? ? ?SOCIAL HISTORY:  ?Social History  ? ?Socioeconomic History  ? Marital status: Married  ?  Spouse name: Not on file  ? Number of children: 1  ? Years of education: ASSOCIATE  ? Highest education level: Not on file  ?Occupational History  ? Occupation: Recruitment consultant  ?  Employer: [employer]  ?Tobacco Use  ? Smoking status: Never  ?  Smokeless tobacco: Never  ?Substance and Sexual Activity  ? Alcohol use: Yes  ?  Comment: rare  ? Drug use: No  ? Sexual activity: Not on file  ?Other Topics Concern  ? Not on file  ?Social History Narrative  ? Patient is married with 1 child.  ? Patient is right handed.  ? Patient has a Associate's degree.  ? Patient drinks 5 cups daily.  ? ?Social Determinants of Health  ? ?Financial Resource Strain: Not on file  ?Food Insecurity: Not on file  ?Transportation Needs: Not on file  ?Physical Activity: Not on file  ?Stress: Not on file  ?Social Connections: Not on file  ?Intimate Partner Violence: Not on file  ? ? ? ?OBSERVATIONS/OBJECTIVE:   ?BP 129/82 (BP Location: Left Arm, Patient Position: Sitting)   Pulse 99   Temp 97.7 ?F (36.5 ?C) (Temporal)   Resp 18   Ht _0  (1.6 m)   Wt 172 lb 4.8 oz (78.2 kg)   SpO2 100%   BMI 30.52 kg/m?  ?GENERAL:

## 2022-02-28 ENCOUNTER — Telehealth: Payer: Self-pay

## 2022-02-28 ENCOUNTER — Telehealth: Payer: Self-pay | Admitting: Hematology and Oncology

## 2022-02-28 NOTE — Telephone Encounter (Signed)
Scheduled appointment per 003/21los. Left message.  ? ?

## 2022-02-28 NOTE — Telephone Encounter (Signed)
Called and left patient a message regarding her medication refill for Exemestane. Patient informed to call us back before we can move forward with the refill request.  ?

## 2022-03-05 ENCOUNTER — Other Ambulatory Visit: Payer: Self-pay

## 2022-03-05 ENCOUNTER — Ambulatory Visit: Payer: BC Managed Care – PPO | Attending: General Surgery

## 2022-03-05 VITALS — Wt 173.1 lb

## 2022-03-05 DIAGNOSIS — Z483 Aftercare following surgery for neoplasm: Secondary | ICD-10-CM

## 2022-03-05 NOTE — Therapy (Signed)
?  OUTPATIENT PHYSICAL THERAPY SOZO SCREENING NOTE ? ? ?Patient Name: Joyce Bennett ?MRN: [id number] ?DOB:Jun 21, 1956, 65 y.o., female ?Today's Date: 03/05/2022 ? ?PCP: [name], MD ?REFERRING PROVIDER: Jovita Kussmaul, MD ? ? PT End of Session - 03/05/22 1037   ? ? Visit Number 2   # unchanged due to screen only  ? PT Start Time 1031   ? PT Stop Time 1037   ? PT Time Calculation (min) 6 min   ? Activity Tolerance Patient tolerated treatment well   ? Behavior During Therapy [facility] for tasks assessed/performed   ? ?  ?  ? ?  ? ? ?Past Medical History:  ?Diagnosis Date  ? Breast cancer ([place])   ? Family history of breast cancer 08/10/2021  ? Family history of prostate cancer 08/10/2021  ? History of radiation therapy   ? left breast - 10/16/2021 - 11/17/2021 - Dr Gery Pray  ? Multiple sclerosis ([place])   ? Neurogenic bladder   ? Optic neuritis, left   ? Restless leg syndrome   ? Urinary tract infection   ? Recurrent  ? ?Past Surgical History:  ?Procedure Laterality Date  ? BREAST LUMPECTOMY WITH RADIOACTIVE SEED AND SENTINEL LYMPH NODE BIOPSY Left 09/01/2021  ? Procedure: LEFT BREAST LUMPECTOMY WITH RADIOACTIVE SEED AND SENTINEL LYMPH NODE BIOPSY;  Surgeon: Jovita Kussmaul, MD;  Location: [place];  Service: General;  Laterality: Left;  ? FOOT SURGERY N/A   ? HAND SURGERY Right   ? ?Patient Active Problem List  ? Diagnosis Date Noted  ? Genetic testing 08/24/2021  ? Family history of breast cancer 08/10/2021  ? Family history of prostate cancer 08/10/2021  ? Malignant neoplasm of lower-outer quadrant of left breast of female, estrogen receptor positive ([place]) 08/03/2021  ? 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 Multiple sclerosis ([place]) 07/28/2012  ? ? ?REFERRING DIAG: left breast cancer at risk for lymphedema ? ?THERAPY DIAG: Aftercare following surgery for neoplasm ? ?PERTINENT HISTORY: Patient was diagnosed on 07/03/2021 with 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 She underwent a left lumpectomy and sentinel node biopsy (4 negative nodes) on 09/01/2021. It is ER positive, PR negative, and HER2 negative with a Ki67 of 50%. She had right hand surgery 40 years ago with hardware placed but it has since been removed. ? ?PRECAUTIONS: left UE Lymphedema risk, None ? ?SUBJECTIVE: Pt returns for her 3 month L-Dex screen."My Lt breast has been sore and feels heavy. I think I need to come for some physical therapy." ? ?PAIN:  ?Are you having pain? No, breast just feels heavy ? ?SOZO SCREENING: ?Patient was assessed today using the SOZO machine to determine the lymphedema index score. This was compared to her baseline score. It was determined that she is within the recommended range when compared to her baseline and no further action is needed at this time. She will continue SOZO screenings. These are done every 3 months for 2 years post operatively followed by every 6 months for 2 years, and then annually. ? ? ?Plan: Cont every 3 month L-Dex screens for up to 2 years from her SLNB (~09/02/2023). Eval for Lt breast lymphedema when she returns.  ? ? ?Otelia Limes, PTA ?03/05/2022, 10:40 AM ? ?  ? ?

## 2022-03-15 ENCOUNTER — Ambulatory Visit: Payer: BC Managed Care – PPO

## 2022-03-28 ENCOUNTER — Encounter (HOSPITAL_COMMUNITY): Payer: Self-pay

## 2022-03-29 ENCOUNTER — Other Ambulatory Visit: Payer: Self-pay | Admitting: Hematology and Oncology

## 2022-04-03 ENCOUNTER — Telehealth: Payer: Self-pay | Admitting: *Deleted

## 2022-04-03 NOTE — Telephone Encounter (Signed)
Received VM from pt.  RN attempt x1 to return call.  No answer, LVM for pt to return call to the office.  °

## 2022-04-05 NOTE — Therapy (Signed)
?OUTPATIENT PHYSICAL THERAPY ONCOLOGY EVALUATION ? ?Patient Name: Joyce Bennett ?MRN: [id number] ?DOB:1956/08/10, 66 y.o., female ?Today's Date: 04/05/2022 ? ? ? ?Past Medical History:  ?Diagnosis Date  ? Breast cancer ([place])   ? Family history of breast cancer 08/10/2021  ? Family history of prostate cancer 08/10/2021  ? History of radiation therapy   ? left breast - 10/16/2021 - 11/17/2021 - Dr Gery Pray  ? Multiple sclerosis ([place])   ? Neurogenic bladder   ? Optic neuritis, left   ? Restless leg syndrome   ? Urinary tract infection   ? Recurrent  ? ?Past Surgical History:  ?Procedure Laterality Date  ? BREAST LUMPECTOMY WITH RADIOACTIVE SEED AND SENTINEL LYMPH NODE BIOPSY Left 09/01/2021  ? Procedure: LEFT BREAST LUMPECTOMY WITH RADIOACTIVE SEED AND SENTINEL LYMPH NODE BIOPSY;  Surgeon: Jovita Kussmaul, MD;  Location: [place];  Service: General;  Laterality: Left;  ? FOOT SURGERY N/A   ? HAND SURGERY Right   ? ?Patient Active Problem List  ? Diagnosis Date Noted  ? Genetic testing 08/24/2021  ? Family history of breast cancer 08/10/2021  ? Family history of prostate cancer 08/10/2021  ? Malignant neoplasm of lower-outer quadrant of left breast of female, estrogen receptor positive ([place]) 08/03/2021  ? 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 Multiple sclerosis ([place]) 07/28/2012  ? ? ?PCP: Dr. Raymond Gurney ? ?REFERRING PROVIDER: Thedore Mins NP ? ?REFERRING DIAG: left breast swelling ? ?THERAPY DIAG:  ?No diagnosis found. ? ?ONSET DATE: 11/2021 ? ?SUBJECTIVE                                                                                                                                                                                          ? ?SUBJECTIVE STATEMENT: Still have occasional sharp pains in the left breast, It feels heavier on the left.  There is no tenderness. She has no limitations in shoulder ROM. I just saw Dr. Marlou Starks and he said everything is fine . Sharp pains seem to be worse  when I wear a looser bra. Get sharp pains 2-3 times per week.  Sleeping on the left side can make my breast sore. I am worried about having a mammogram and making it really sore. ? ?PERTINENT HISTORY:  ? ?09/01/2021 Surgery  ?  Left lumpectomy: Focal residual DCIS high-grade, no residual invasive ductal carcinoma, resection margins negative, 0/4 lymph nodes negative, tumor size: 0.1 cm, ER 95%, PR 0%, HER2 1+, Ki-67 50%. Pt had radiation following surgery  ? ?PAIN:  ?Are you having pain? Yes ?NPRS scale: 4/10 ?Pain location: left breast ?Pain orientation: Left  ?PAIN TYPE:  sharp ?Pain description: sharp  ?Aggravating factors: looser bra maybe,  ?Relieving factors: tighter more supportive bra ? ?PRECAUTIONS: Other: lymphedema risk, MS, prior right hand surgery ? ?WEIGHT BEARING RESTRICTIONS No ? ?FALLS:  ?Has patient fallen in last 6 months? No ? ?LIVING ENVIRONMENT: ?Lives with: lives with their spouse and lives with their daughter ?Lives in: House/apartment ?Stairs: Yes; Internal: 14 steps; on right going up and on left going up ?Has following equipment at home: None ? ?OCCUPATION: Drives school bus ? ?LEISURE: walks occasionally ? ?HAND DOMINANCE : right  ? ?PRIOR LEVEL OF FUNCTION: Independent ? ?PATIENT GOALS Get some suggestions so it isn't so tender ? ? ?OBJECTIVE ? ?COGNITION: ? Overall cognitive status: Within functional limits for tasks assessed  ? ?PALPATION: Mild thickening of skin left breast secondary to radiation. No fibrosis noted at incisions sites. 1 small area of tenderness above nipple, but otherwise non-tender. ? ?OBSERVATIONS / OTHER ASSESSMENTS: Large breasted with more pronounced indentations from lace bra on the left.  Swelling noted at lateral breast/trunk region ? ?SENSATION: ? Light touch: Appears intact ?  ? ?POSTURE: forward head, rounded shoulders ? ?UPPER EXTREMITY AROM/PROM: ? ?A/PROM RIGHT  04/05/2022 ?  ?Shoulder extension WNL  ?Shoulder flexion 150  ?Shoulder abduction 165  ?Shoulder  internal rotation   ?Shoulder external rotation   ?  (Blank rows = not tested) ? ?A/PROM LEFT  04/05/2022  ?Shoulder extension WNL  ?Shoulder flexion 150  ?Shoulder abduction 165  ?Shoulder internal rotation   ?Shoulder external rotation   ?  (Blank rows = not tested) ? ? ?CERVICAL AROM: ?All within functional limits:  ? ? ? ?UPPER EXTREMITY STRENGTH: WNL ? ? ?LYMPHEDEMA ASSESSMENTS:  ? ?SURGERY TYPE/DATE: 09/01/2021, Left lumpectomy with SLNB ? ?NUMBER OF LYMPH NODES REMOVED: 0/4 ? ?CHEMOTHERAPY: no ? ?RADIATION:completed 12/22 ? ?HORMONE TREATMENT: yes ? ?INFECTIONS: no ? ?LYMPHEDEMA ASSESSMENTS:  ? ?LANDMARK RIGHT  04/05/2022  ?10 cm proximal to olecranon process 28.3  ?Olecranon process 24.5  ?10 cm proximal to ulnar styloid process 20.3  ?Just proximal to ulnar styloid process 15.2  ?Across hand at thumb web space   ?At base of 2nd digit 6.0  ?(Blank rows = not tested) ? ?[place] LEFT  04/05/2022  ?10 cm proximal to olecranon process 27.8  ?Olecranon process 24.5  ?10 cm proximal to ulnar styloid process 19.7  ?Just proximal to ulnar styloid process 15.3  ?Across hand at thumb web space   ?At base of 2nd digit 5.8  ?(Blank rows = not tested) ? ? ? ? ? ?QUICK DASH SURVEY: NA ? ? ?TODAY'S TREATMENT  ?Educated pt in compression bras and gave her a script and flyer to go to [facility] [place]. Discussed flexitouch and gave her a brochure, but pt prefers to wait until after she has learned MLD to decide it she wants it ? ?PATIENT EDUCATION:  ?Education details: compression bra, flexitouch ?Person educated: Patient ?Education method: Explanation ?Education comprehension: verbalized understanding ? ? ?HOME EXERCISE PROGRAM: ?None given ? ?ASSESSMENT: ? ?CLINICAL IMPRESSION: ?Patient is a 66 y.o. female who was seen today for physical therapy evaluation and treatment for complaints of left breast heaviness, swelling, and intermittent sharp pain. She is large breasted and post radiation. There is some skin thickening and  ridges from her bra are more pronounced on the left .  The left lateral trunk region does appear to have some swelling. Incisions are healed with no present fibrosis. She will benefit from skilled PT to address breast  swelling/heaviness and pain. ? ? ?OBJECTIVE IMPAIRMENTS decreased knowledge of condition and increased edema.  ? ?ACTIVITY LIMITATIONS  no functional limitations other than sleeping on left side can be uncomfortable on the breast .  ? ?PERSONAL FACTORS 1-2 comorbidities: breast cancer s/p radiation, MS  are also affecting patient's functional outcome.  ? ? ?REHAB POTENTIAL: Excellent ? ?CLINICAL DECISION MAKING: Stable/uncomplicated ? ?EVALUATION COMPLEXITY: Low ? ?GOALS: ?Goals reviewed with patient? Yes ? ?SHORT TERM GOALS: Target date: 04/27/2022 ? ?Pt will report 25 percent improvement in left breast swelling ?Baseline: ?Goal status: INITIAL ? ?2.  Pt will purchase compression bra to assist with left breast swelling/heaviness ?Baseline:  ?Goal status: INITIAL ? ? ? ?LONG TERM GOALS: Target date: 05/18/2022 ? ?Pt will note atleast 50% improvement in left breast swelling ?Baseline:  ?Goal status: INITIAL ? ?2.  Pt will be independent in left breast MLD ?Baseline:  ?Goal status: INITIAL ? ?3.  Pt will be able to lie on left side without left breat pain. ?Baseline:  ?Goal status: INITIAL ? ?PLAN: ?PT FREQUENCY: 1-2x/week ? ?PT DURATION: 6 weeks ? ?PLANNED INTERVENTIONS: Therapeutic exercises, Therapeutic activity, Patient/Family education, Manual lymph drainage, Vasopneumatic device, and Manual therapy ? ?PLAN FOR NEXT SESSION: Instruct anatomy and physiology of lymphatic system, initiate MLD to left breast and start instructing pt. She is not interested in Flexitouch yet but may be after learning MLD ? ? ? ?Claris Pong, PT ?04/05/2022, 1:44 PM ? ?

## 2022-04-06 ENCOUNTER — Other Ambulatory Visit: Payer: Self-pay | Admitting: Adult Health

## 2022-04-06 ENCOUNTER — Ambulatory Visit: Payer: BC Managed Care – PPO | Attending: General Surgery

## 2022-04-06 DIAGNOSIS — R600 Localized edema: Secondary | ICD-10-CM | POA: Diagnosis present

## 2022-04-06 DIAGNOSIS — Z483 Aftercare following surgery for neoplasm: Secondary | ICD-10-CM | POA: Insufficient documentation

## 2022-04-06 DIAGNOSIS — C50512 Malignant neoplasm of lower-outer quadrant of left female breast: Secondary | ICD-10-CM | POA: Insufficient documentation

## 2022-04-06 DIAGNOSIS — Z170 Estrogen receptor positive status [ER+]: Secondary | ICD-10-CM | POA: Insufficient documentation

## 2022-04-06 DIAGNOSIS — C50412 Malignant neoplasm of upper-outer quadrant of left female breast: Secondary | ICD-10-CM | POA: Diagnosis present

## 2022-04-10 ENCOUNTER — Ambulatory Visit
Admission: RE | Admit: 2022-04-10 | Discharge: 2022-04-10 | Disposition: A | Discharge status: Home / Self Care | Payer: BC Managed Care – PPO | Source: Ambulatory Visit | Attending: Adult Health | Admitting: Adult Health

## 2022-04-10 DIAGNOSIS — Z170 Estrogen receptor positive status [ER+]: Secondary | ICD-10-CM

## 2022-04-23 ENCOUNTER — Ambulatory Visit: Payer: BC Managed Care – PPO | Admitting: Adult Health

## 2022-04-23 VITALS — BP 122/79 | HR 76 | Ht 63.0 in | Wt 174.8 lb

## 2022-04-23 DIAGNOSIS — Z5181 Encounter for therapeutic drug level monitoring: Secondary | ICD-10-CM

## 2022-04-23 DIAGNOSIS — G35 Multiple sclerosis: Secondary | ICD-10-CM

## 2022-04-23 NOTE — Progress Notes (Signed)
? ? ?PATIENT: Joyce Bennett ?DOB: 12-Jun-1956 ? ?REASON FOR VISIT: follow up ?HISTORY FROM: patient ? ?Chief Complaint  ?Patient presents with  ? Follow-up  ?  Rm 19, MS. Betaseron. [facility] KitchenPCP has not done recent labwork.    ? ? ? ?HISTORY OF PRESENT ILLNESS: ?Today 04/23/22: ? ?Joyce Bennett is a 66 year old female with a history of Multiple sclerosis. She returns today for follow-up. Reports that symptoms have been stable. Remains on Betaseron. Didn't repeat MRI as ordered at the last visit. Denies any changes with gait or balance. No changes with vision. No change with bowels or bladder. Returns today for follow-up. ? ?10/27/21: Joyce Bennett is a 66 year old female with a history of multiple sclerosis.  She returns today for follow-up.  Overall she feels that she is doing well.  Denies any changes with her gait or balance.  Denies any new numbness or weakness.  No changes with her vision.  No changes with the bowels or bladder.  She continues on Betaseron.  She states that she is getting radiation for stage I breast cancer.   She returns today for an evaluation. ? ?04/16/21: Joyce Bennett is a 66 year old female with a history of multiple sclerosis.  She returns today for follow-up.  Overall she feels that she has been doing well.  She denies any new symptoms.  No new numbness or tingling.  No changes with her gait or balance.  No changes with the bowels or bladder.  No visual changes.  She remains on Betaseron.  She returns today for an evaluation. ? ?HISTORY 06/18/19: ?  ?Joyce Bennett is a 66 year old female with a history of multiple sclerosis.  She returns today for follow-up.  She remains on Betaseron.  She denies any new symptoms.  No changes with her gait or balance.  No changes with her bowels or bladder.  No new numbness or weakness.  No changes with her vision.  Her last MRI of the brain was in 2017.  She joins me today for follow-up. ? ?REVIEW OF SYSTEMS: Out of a complete 14 system review of symptoms,  the patient complains only of the following symptoms, and all other reviewed systems are negative. ? ?See HPI ? ?ALLERGIES: ?Allergies  ?Allergen Reactions  ? Keflex [Cephalexin]   ? Shellfish Allergy Swelling  ? ? ?HOME MEDICATIONS: ?Outpatient Medications Prior to Visit  ?Medication Sig Dispense Refill  ? BETASERON 0.3 MG KIT injection ADD 1.2 ML DILUENT TO VIAL AND MIX GENTLY. INJECT 1 ML SUBCUTANEOUSLY EVERY OTHER DAY. USE WITHIN 3 HOURS OF MIXING. STORE AT ROOM TEMPERATURE. 42 kit 5  ? CALCIUM PO Take by mouth daily.    ? exemestane (AROMASIN) 25 MG tablet TAKE 1 TABLET (25 MG TOTAL) BY MOUTH DAILY AFTER BREAKFAST. 90 tablet 1  ? fluticasone (FLONASE) 50 MCG/ACT nasal spray Place 1 spray into both nostrils 2 (two) times daily.    ? Multiple Vitamin (MULTIVITAMIN) tablet Take 1 tablet by mouth daily.    ? Multiple Vitamins-Minerals (ZINC PO) Take 1 tablet by mouth daily.    ? olopatadine (PATANOL) 0.1 % ophthalmic solution Place 1 drop into both eyes 2 (two) times daily.    ? trimethoprim (TRIMPEX) 100 MG tablet Take 1 tablet (100 mg total) by mouth at bedtime as needed.    ? Chlorpheniramine Maleate (ALLERGY PO) Take by mouth.    ? COLLAGEN PO Take 2 each by mouth daily. (Patient not taking: Reported on 04/06/2022)    ? ?Facility-Administered  Medications Prior to Visit  ?Medication Dose Route Frequency Provider Last Rate Last Admin  ? gadopentetate dimeglumine (MAGNEVIST) injection 15 mL  15 mL Intravenous Once PRN Kathrynn Ducking, MD      ? ? ?PAST MEDICAL HISTORY: ?Past Medical History:  ?Diagnosis Date  ? Breast cancer ([place])   ? Family history of breast cancer 08/10/2021  ? Family history of prostate cancer 08/10/2021  ? History of radiation therapy   ? left breast - 10/16/2021 - 11/17/2021 - Dr Gery Pray  ? Multiple sclerosis ([place])   ? Neurogenic bladder   ? Optic neuritis, left   ? Restless leg syndrome   ? Urinary tract infection   ? Recurrent  ? ? ?PAST SURGICAL HISTORY: ?Past Surgical History:   ?Procedure Laterality Date  ? BREAST LUMPECTOMY WITH RADIOACTIVE SEED AND SENTINEL LYMPH NODE BIOPSY Left 09/01/2021  ? Procedure: LEFT BREAST LUMPECTOMY WITH RADIOACTIVE SEED AND SENTINEL LYMPH NODE BIOPSY;  Surgeon: Jovita Kussmaul, MD;  Location: [facility];  Service: General;  Laterality: Left;  ? FOOT SURGERY N/A   ? HAND SURGERY Right   ? ? ?FAMILY HISTORY: ?Family History  ?Problem Relation Age of Onset  ? Stomach cancer Sister   ?     dx 56s  ? Breast cancer Maternal Aunt   ?     dx after 14, x2 maternal aunts  ? Prostate cancer Maternal Uncle   ?     dx 75s  ? Heart disease Maternal Grandmother   ? Breast cancer Paternal Grandmother   ?     dx after 56  ? Multiple sclerosis Neg Hx   ? ? ?SOCIAL HISTORY: ?Social History  ? ?Socioeconomic History  ? Marital status: Married  ?  Spouse name: Not on file  ? Number of children: 1  ? Years of education: ASSOCIATE  ? Highest education level: Not on file  ?Occupational History  ? Occupation: Recruitment consultant  ?  Employer: [employer]  ?Tobacco Use  ? Smoking status: Never  ? Smokeless tobacco: Never  ?Substance and Sexual Activity  ? Alcohol use: Yes  ?  Comment: rare  ? Drug use: No  ? Sexual activity: Not on file  ?Other Topics Concern  ? Not on file  ?Social History Narrative  ? Patient is married with 1 child.  ? Patient is right handed.  ? Patient has a Associate's degree.  ? Patient drinks 5 cups daily.  ? ?Social Determinants of Health  ? ?Financial Resource Strain: Not on file  ?Food Insecurity: Not on file  ?Transportation Needs: Not on file  ?Physical Activity: Not on file  ?Stress: Not on file  ?Social Connections: Not on file  ?Intimate Partner Violence: Not on file  ? ? ? ? ?PHYSICAL EXAM ? ?Vitals:  ? 04/23/22 1006  ?BP: 122/79  ?Pulse: 76  ?Weight: 174 lb 12.8 oz (79.3 kg)  ?Height: '5\' 3"'  (1.6 m)  ? ? ?Body mass index is 30.96 kg/m?. ? ?Generalized: Well developed, in no acute distress  ? ?Neurological examination  ?Mentation:  Alert oriented to time, place, history taking. Follows all commands speech and language fluent ?Cranial nerve II-XII: Pupils were equal round reactive to light. Extraocular movements were full, visual field were full on confrontational test. Facial sensation and strength were normal. Uvula tongue midline. Head turning and shoulder shrug  were normal and symmetric. ?Motor: The motor testing reveals 5 over 5 strength of all 4 extremities. Good symmetric  motor tone is noted throughout.  ?Sensory: Sensory testing is intact to soft touch on all 4 extremities. No evidence of extinction is noted.  ?Coordination: Cerebellar testing reveals good finger-nose-finger and heel-to-shin bilaterally.  ?Gait and station: Gait is normal.  ? ? ?DIAGNOSTIC DATA (LABS, IMAGING, TESTING) ?- I reviewed patient records, labs, notes, testing and imaging myself where available. ? ?Lab Results  ?Component Value Date  ? WBC 5.8 08/09/2021  ? HGB 11.3 (L) 08/09/2021  ? HCT 35.7 (L) 08/09/2021  ? MCV 91.1 08/09/2021  ? PLT 323 08/09/2021  ? ?   ?Component Value Date/Time  ? NA 141 08/09/2021 1243  ? NA 142 06/22/2020 1319  ? K 4.3 08/09/2021 1243  ? CL 105 08/09/2021 1243  ? CO2 27 08/09/2021 1243  ? GLUCOSE 80 08/09/2021 1243  ? BUN 15 08/09/2021 1243  ? BUN 18 06/22/2020 1319  ? CREATININE 0.84 08/09/2021 1243  ? CALCIUM 9.5 08/09/2021 1243  ? PROT 7.6 08/09/2021 1243  ? PROT 6.8 06/22/2020 1319  ? ALBUMIN 3.8 08/09/2021 1243  ? ALBUMIN 4.1 06/22/2020 1319  ? AST 24 08/09/2021 1243  ? ALT 13 08/09/2021 1243  ? ALKPHOS 130 (H) 08/09/2021 1243  ? BILITOT 0.3 08/09/2021 1243  ? GFRNONAA >60 08/09/2021 1243  ? GFRAA 96 06/22/2020 1319  ? ? ? ? ?ASSESSMENT AND PLAN ?66 y.o. year old female  has a past medical history of Breast cancer ([place]), Family history of breast cancer (08/10/2021), Family history of prostate cancer (08/10/2021), History of radiation therapy, Multiple sclerosis ([place]), Neurogenic bladder, Optic neuritis, left, Restless leg  syndrome, and Urinary tract infection. here with: ? ?Multiple sclerosis ? ?Stable ?Continue Betaseron  ?Ready to schedule MRI brain w/wo contrast. Notified MRI coordinator Raquel Sarna) ?Blood work ordered ? ?Follow-up in 6 m

## 2022-04-23 NOTE — Patient Instructions (Signed)
Your Plan: ? ?Continue betaseron  ?Blood work today ?Repeat MRI brain ? ? ? ? ?Thank you for coming to see [place] at [facility]. I hope we have been able to provide you high quality care today. ? ?You may receive a patient satisfaction survey over the next few weeks. We would appreciate your feedback and comments so that we may continue to improve ourselves and the health of our patients. ? ?

## 2022-04-24 LAB — CBC WITH DIFFERENTIAL/PLATELET
Basophils Absolute: 0 10*3/uL (ref 0.0–0.2)
Basos: 1 %
EOS (ABSOLUTE): 0.1 10*3/uL (ref 0.0–0.4)
Eos: 3 %
Hematocrit: 40.2 % (ref 34.0–46.6)
Hemoglobin: 13.1 g/dL (ref 11.1–15.9)
Immature Grans (Abs): 0 10*3/uL (ref 0.0–0.1)
Immature Granulocytes: 0 %
Lymphocytes Absolute: 1.5 10*3/uL (ref 0.7–3.1)
Lymphs: 30 %
MCH: 28.3 pg (ref 26.6–33.0)
MCHC: 32.6 g/dL (ref 31.5–35.7)
MCV: 87 fL (ref 79–97)
Monocytes Absolute: 0.6 10*3/uL (ref 0.1–0.9)
Monocytes: 11 %
Neutrophils Absolute: 2.9 10*3/uL (ref 1.4–7.0)
Neutrophils: 55 %
Platelets: 422 10*3/uL (ref 150–450)
RBC: 4.63 x10E6/uL (ref 3.77–5.28)
RDW: 13.1 % (ref 11.7–15.4)
WBC: 5.1 10*3/uL (ref 3.4–10.8)

## 2022-04-24 LAB — COMPREHENSIVE METABOLIC PANEL
ALT: 13 IU/L (ref 0–32)
AST: 25 IU/L (ref 0–40)
Albumin/Globulin Ratio: 1.6 (ref 1.2–2.2)
Albumin: 4.8 g/dL (ref 3.8–4.8)
Alkaline Phosphatase: 150 IU/L — ABNORMAL HIGH (ref 44–121)
BUN/Creatinine Ratio: 12 (ref 12–28)
BUN: 12 mg/dL (ref 8–27)
Bilirubin Total: 0.3 mg/dL (ref 0.0–1.2)
CO2: 25 mmol/L (ref 20–29)
Calcium: 10.4 mg/dL — ABNORMAL HIGH (ref 8.7–10.3)
Chloride: 103 mmol/L (ref 96–106)
Creatinine, Ser: 1.01 mg/dL — ABNORMAL HIGH (ref 0.57–1.00)
Globulin, Total: 3 g/dL (ref 1.5–4.5)
Glucose: 59 mg/dL — ABNORMAL LOW (ref 70–99)
Potassium: 4.7 mmol/L (ref 3.5–5.2)
Sodium: 142 mmol/L (ref 134–144)
Total Protein: 7.8 g/dL (ref 6.0–8.5)
eGFR: 62 mL/min/{1.73_m2} (ref >59)

## 2022-04-25 ENCOUNTER — Ambulatory Visit: Payer: BC Managed Care – PPO | Attending: General Surgery

## 2022-04-25 DIAGNOSIS — Z170 Estrogen receptor positive status [ER+]: Secondary | ICD-10-CM | POA: Diagnosis present

## 2022-04-25 DIAGNOSIS — R293 Abnormal posture: Secondary | ICD-10-CM | POA: Diagnosis present

## 2022-04-25 DIAGNOSIS — Z483 Aftercare following surgery for neoplasm: Secondary | ICD-10-CM | POA: Insufficient documentation

## 2022-04-25 DIAGNOSIS — C50412 Malignant neoplasm of upper-outer quadrant of left female breast: Secondary | ICD-10-CM | POA: Insufficient documentation

## 2022-04-25 DIAGNOSIS — R600 Localized edema: Secondary | ICD-10-CM | POA: Insufficient documentation

## 2022-04-25 NOTE — Therapy (Signed)
?OUTPATIENT PHYSICAL THERAPY ONCOLOGY TREATMENT ? ?Patient Name: Joyce Bennett ?MRN: [id number] ?DOB:[birth year], 66 y.o., female ?Today's Date: 04/25/2022 ? ? PT End of Session - 04/25/22 1102   ? ? Visit Number 2   ? Number of Visits 12   ? Date for PT Re-Evaluation 05/18/22   ? PT Start Time 1104   ? PT Stop Time 1151   ? PT Time Calculation (min) 47 min   ? Activity Tolerance Patient tolerated treatment well   ? Behavior During Therapy [facility] for tasks assessed/performed   ? ?  ?  ? ?  ? ? ?Past Medical History:  ?Diagnosis Date  ? Breast cancer ([place])   ? Family history of breast cancer 08/10/2021  ? Family history of prostate cancer 08/10/2021  ? History of radiation therapy   ? left breast - 10/16/2021 - 11/17/2021 - Dr Gery Pray  ? Multiple sclerosis ([place])   ? Neurogenic bladder   ? Optic neuritis, left   ? Restless leg syndrome   ? Urinary tract infection   ? Recurrent  ? ?Past Surgical History:  ?Procedure Laterality Date  ? BREAST LUMPECTOMY WITH RADIOACTIVE SEED AND SENTINEL LYMPH NODE BIOPSY Left 09/01/2021  ? Procedure: LEFT BREAST LUMPECTOMY WITH RADIOACTIVE SEED AND SENTINEL LYMPH NODE BIOPSY;  Surgeon: Jovita Kussmaul, MD;  Location: [facility];  Service: General;  Laterality: Left;  ? FOOT SURGERY N/A   ? HAND SURGERY Right   ? ?Patient Active Problem List  ? Diagnosis Date Noted  ? Genetic testing 08/24/2021  ? Family history of breast cancer 08/10/2021  ? Family history of prostate cancer 08/10/2021  ? Malignant neoplasm of lower-outer quadrant of left breast of female, estrogen receptor positive ([place]) 08/03/2021  ? 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 Multiple sclerosis ([place]) 07/28/2012  ? ? ?PCP: Dr. Raymond Gurney ? ?REFERRING PROVIDER: Thedore Mins NP ? ?REFERRING DIAG: left breast swelling ? ?THERAPY DIAG:  ?Localized edema ? ?Aftercare following surgery for neoplasm ? ?Malignant neoplasm of upper-outer quadrant of left breast in female, estrogen receptor positive  ([place]) ? ?Abnormal posture ? ?ONSET DATE: 11/2021 ? ?SUBJECTIVE                                                                                                                                                                                          ? ?SUBJECTIVE STATEMENT:  ?I got the compression bra but it doesn't feel as tight as my other bra and it was $90.. I havent had the sharp pains like I was, and I have been laying on my left side but it makes me sore  in the morning ? ?PERTINENT HISTORY:  ? ?09/01/2021 Surgery  ?  Left lumpectomy: Focal residual DCIS high-grade, no residual invasive ductal carcinoma, resection margins negative, 0/4 lymph nodes negative, tumor size: 0.1 cm, ER 95%, PR 0%, HER2 1+, Ki-67 50%. Pt had radiation following surgery  ? ?PAIN:  ?Are you having pain? Yes ?NPRS scale: 4/10 ?Pain location: left breast ?Pain orientation: Left  ?PAIN TYPE: sharp ?Pain description: sharp  ?Aggravating factors: looser bra maybe,  ?Relieving factors: tighter more supportive bra ? ?PRECAUTIONS: Other: lymphedema risk, MS, prior right hand surgery ? ?WEIGHT BEARING RESTRICTIONS No ? ?FALLS:  ?Has patient fallen in last 6 months? No ? ?LIVING ENVIRONMENT: ?Lives with: lives with their spouse and lives with their daughter ?Lives in: House/apartment ?Stairs: Yes; Internal: 14 steps; on right going up and on left going up ?Has following equipment at home: None ? ?OCCUPATION: Drives school bus ? ?LEISURE: walks occasionally ? ?HAND DOMINANCE : right  ? ?PRIOR LEVEL OF FUNCTION: Independent ? ?PATIENT GOALS Get some suggestions so it isn't so tender ? ? ?OBJECTIVE ? ?COGNITION: ? Overall cognitive status: Within functional limits for tasks assessed  ? ?PALPATION: Mild thickening of skin left breast secondary to radiation. No fibrosis noted at incisions sites. 1 small area of tenderness above nipple, but otherwise non-tender. ? ?OBSERVATIONS / OTHER ASSESSMENTS: Large breasted with more pronounced indentations from  lace bra on the left.  Swelling noted at lateral breast/trunk region ? ?SENSATION: ? Light touch: Appears intact ?  ? ?POSTURE: forward head, rounded shoulders ? ?UPPER EXTREMITY AROM/PROM: ? ?A/PROM RIGHT  04/06/2022 ?  ?Shoulder extension WNL  ?Shoulder flexion 150  ?Shoulder abduction 165  ?Shoulder internal rotation   ?Shoulder external rotation   ?  (Blank rows = not tested) ? ?A/PROM LEFT  04/06/2022  ?Shoulder extension WNL  ?Shoulder flexion 150  ?Shoulder abduction 165  ?Shoulder internal rotation   ?Shoulder external rotation   ?  (Blank rows = not tested) ? ? ?CERVICAL AROM: ?All within functional limits:  ? ? ? ?UPPER EXTREMITY STRENGTH: WNL ? ? ?LYMPHEDEMA ASSESSMENTS:  ? ?SURGERY TYPE/DATE: 09/01/2021, Left lumpectomy with SLNB ? ?NUMBER OF LYMPH NODES REMOVED: 0/4 ? ?CHEMOTHERAPY: no ? ?RADIATION:completed 12/22 ? ?HORMONE TREATMENT: yes ? ?INFECTIONS: no ? ?LYMPHEDEMA ASSESSMENTS:  ? ?[place] RIGHT  04/06/2022  ?10 cm proximal to olecranon process 28.3  ?Olecranon process 24.5  ?10 cm proximal to ulnar styloid process 20.3  ?Just proximal to ulnar styloid process 15.2  ?Across hand at thumb web space   ?At base of 2nd digit 6.0  ?(Blank rows = not tested) ? ?[place] LEFT  04/06/2022  ?10 cm proximal to olecranon process 27.8  ?Olecranon process 24.5  ?10 cm proximal to ulnar styloid process 19.7  ?Just proximal to ulnar styloid process 15.3  ?Across hand at thumb web space   ?At base of 2nd digit 5.8  ?(Blank rows = not tested) ? ? ? ? ? ?QUICK DASH SURVEY: NA ? ? ?TODAY'S TREATMENT  ? ?04/25/2022 ?Pt tried on her new compression bra and we adjusted straps and hooks to snug it up some. She didn't like it as well as her regular bra but we discussed trying it at night to see if lateral breast pain improves. ?PT initiated MLD to the left breast with education about lymphatics and showing diagrams. ?Supraclavicular, 5 breaths, bilateral axillary LN's, left inguinal LN's, and anterior interaxillary pathway ,  left axillo-inguinal pathway, and left breast directing to pathways and  retracing all steps and ending with LN's, Pt was instructed in all techniques and practiced each with intermittent VC's and TC's. ?Pt was given a handout for self MLD ? ? ? ? ?04/06/2022 ?Educated pt in compression bras and gave her a script and flyer to go to [facility] [employer] [place]. Discussed flexitouch and gave her a brochure, but pt prefers to wait until after she has learned MLD to decide it she wants it ? ?PATIENT EDUCATION:  ?Education details: Left breast MLD ?Person educated: Patient ?Education method: Explanation ?Education comprehension: verbalized understanding ? ? ?HOME EXERCISE PROGRAM: ?Self breast MLD ? ?ASSESSMENT: ? ?CLINICAL IMPRESSION: ?Pt has noted decrease in intermittent sharp breast pains. She did purchase a compression bra but does not feel it is as supportive as her regular bra. She will wear it when she has on loose clothes and will try it at night. She did well with self MLD to the left breast but does require VC's and TC's and review ? ?OBJECTIVE IMPAIRMENTS decreased knowledge of condition and increased edema.  ? ?ACTIVITY LIMITATIONS  no functional limitations other than sleeping on left side can be uncomfortable on the breast .  ? ?PERSONAL FACTORS 1-2 comorbidities: breast cancer s/p radiation, MS  are also affecting patient's functional outcome.  ? ? ?REHAB POTENTIAL: Excellent ? ?CLINICAL DECISION MAKING: Stable/uncomplicated ? ?EVALUATION COMPLEXITY: Low ? ?GOALS: ?Goals reviewed with patient? Yes ? ?SHORT TERM GOALS: Target date: 04/27/2022 ? ?Pt will report 25 percent improvement in left breast swelling ?Baseline: ?Goal status: INITIAL ? ?2.  Pt will purchase compression bra to assist with left breast swelling/heaviness ?Baseline:  ?Goal status: INITIAL ? ? ? ?LONG TERM GOALS: Target date: 06/06/2022 ? ?Pt will note atleast 50% improvement in left breast swelling ?Baseline:  ?Goal status: INITIAL ? ?2.  Pt will be  independent in left breast MLD ?Baseline:  ?Goal status: INITIAL ? ?3.  Pt will be able to lie on left side without left breat pain. ?Baseline:  ?Goal status: INITIAL ? ?PLAN: ?PT FREQUENCY: 1-2x/week ? ?PT DURA

## 2022-04-26 ENCOUNTER — Telehealth: Payer: Self-pay | Admitting: *Deleted

## 2022-04-26 NOTE — Telephone Encounter (Signed)
Spoke with pt and discussed lab results. Pt's questions were answered. She confirmed PCP name. Labs forwarded to Dr Melissa Montane.

## 2022-04-26 NOTE — Telephone Encounter (Signed)
-----   Message from Ward Givens, NP sent at 04/26/2022 11:11 AM EDT ----- Glucose was low and alkaline phosphatase has slowly increased.  Please forward to PCP for their review as well

## 2022-04-27 ENCOUNTER — Ambulatory Visit: Payer: BC Managed Care – PPO

## 2022-04-27 DIAGNOSIS — Z483 Aftercare following surgery for neoplasm: Secondary | ICD-10-CM

## 2022-04-27 DIAGNOSIS — C50412 Malignant neoplasm of upper-outer quadrant of left female breast: Secondary | ICD-10-CM

## 2022-04-27 DIAGNOSIS — R293 Abnormal posture: Secondary | ICD-10-CM

## 2022-04-27 DIAGNOSIS — R600 Localized edema: Secondary | ICD-10-CM

## 2022-04-27 NOTE — Patient Instructions (Signed)
Access Code: [email or web address] URL: [email or web address] Date: 04/27/2022 Prepared by: Cheral Almas  Exercises - Supine Lower Trunk Rotation  - 1-2 x daily - 7 x weekly - 3-5 reps - 5-10 hold

## 2022-04-27 NOTE — Therapy (Signed)
OUTPATIENT PHYSICAL THERAPY ONCOLOGY TREATMENT  Patient Name: Joyce Bennett MRN: [id number] DOB:09-29-1956, 66 y.o., female Today's Date: 04/27/2022   PT End of Session - 04/27/22 1101     Visit Number 3    Number of Visits 12    Date for PT Re-Evaluation 05/18/22    PT Start Time 1103    PT Stop Time 1150    PT Time Calculation (min) 47 min    Activity Tolerance Patient tolerated treatment well    Behavior During Therapy [facility] for tasks assessed/performed             Past Medical History:  Diagnosis Date   Breast cancer ([place])    Family history of breast cancer 08/10/2021   Family history of prostate cancer 08/10/2021   History of radiation therapy    left breast - 10/16/2021 - 11/17/2021 - Dr Gery Pray   Multiple sclerosis [facility])    Neurogenic bladder    Optic neuritis, left    Restless leg syndrome    Urinary tract infection    Recurrent   Past Surgical History:  Procedure Laterality Date   BREAST LUMPECTOMY WITH RADIOACTIVE SEED AND SENTINEL LYMPH NODE BIOPSY Left 09/01/2021   Procedure: LEFT BREAST LUMPECTOMY WITH RADIOACTIVE SEED AND SENTINEL LYMPH NODE BIOPSY;  Surgeon: Jovita Kussmaul, MD;  Location: [facility];  Service: General;  Laterality: Left;   FOOT SURGERY N/A    HAND SURGERY Right    Patient Active Problem List   Diagnosis Date Noted   Genetic testing 08/24/2021   Family history of breast cancer 08/10/2021   Family history of prostate cancer 08/10/2021   Malignant neoplasm of lower-outer quadrant of left breast of female, estrogen receptor positive ([place]) 08/03/2021   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 Raymond Gurney  REFERRING PROVIDER: Thedore Mins NP  REFERRING DIAG: left breast swelling  THERAPY DIAG:  Localized edema  Aftercare following surgery for neoplasm  Malignant neoplasm of upper-outer quadrant of left breast in female, estrogen receptor positive  ([place])  Abnormal posture  ONSET DATE: 11/2021  SUBJECTIVE                                                                                                                                                                                           SUBJECTIVE STATEMENT:  I tried the compression bra at night but it was sore under the band on the left, and it made my breast more sore. I tried the MLD  but I have a few questions.  PERTINENT HISTORY:   09/01/2021 Surgery  Left lumpectomy: Focal residual DCIS high-grade, no residual invasive ductal carcinoma, resection margins negative, 0/4 lymph nodes negative, tumor size: 0.1 cm, ER 95%, PR 0%, HER2 1+, Ki-67 50%. Pt had radiation following surgery   PAIN:  Are you having pain? No just sore NPRS scale: 4/10 Pain location: left breast Pain orientation: Left  PAIN TYPE: sharp Pain description: sharp  Aggravating factors: looser bra maybe,  Relieving factors: tighter more supportive bra  PRECAUTIONS: Other: lymphedema risk, MS, prior right hand surgery  WEIGHT BEARING RESTRICTIONS No  FALLS:  Has patient fallen in last 6 months? No  LIVING ENVIRONMENT: Lives with: lives with their spouse and lives with their daughter Lives in: House/apartment Stairs: Yes; Internal: 14 steps; on right going up and on left going up Has following equipment at home: None  OCCUPATION: Drives school bus  LEISURE: walks occasionally  HAND DOMINANCE : right   PRIOR LEVEL OF FUNCTION: Independent  PATIENT GOALS Get some suggestions so it isn't so tender   OBJECTIVE  COGNITION:  Overall cognitive status: Within functional limits for tasks assessed   PALPATION: Mild thickening of skin left breast secondary to radiation. No fibrosis noted at incisions sites. 1 small area of tenderness above nipple, but otherwise non-tender.  OBSERVATIONS / OTHER ASSESSMENTS: Large breasted with more pronounced indentations from lace bra on the left.  Swelling noted  at lateral breast/trunk region  SENSATION:  Light touch: Appears intact    POSTURE: forward head, rounded shoulders  UPPER EXTREMITY AROM/PROM:  A/PROM RIGHT  04/06/2022   Shoulder extension WNL  Shoulder flexion 150  Shoulder abduction 165  Shoulder internal rotation   Shoulder external rotation     (Blank rows = not tested)  A/PROM LEFT  04/06/2022  Shoulder extension WNL  Shoulder flexion 150  Shoulder abduction 165  Shoulder internal rotation   Shoulder external rotation     (Blank rows = not tested)   CERVICAL AROM: All within functional limits:     UPPER EXTREMITY STRENGTH: WNL   LYMPHEDEMA ASSESSMENTS:   SURGERY TYPE/DATE: 09/01/2021, Left lumpectomy with SLNB  NUMBER OF LYMPH NODES REMOVED: 0/4  CHEMOTHERAPY: no  RADIATION:completed 12/22  HORMONE TREATMENT: yes  INFECTIONS: no  LYMPHEDEMA ASSESSMENTS:   LANDMARK RIGHT  04/06/2022  10 cm proximal to olecranon process 28.3  Olecranon process 24.5  10 cm proximal to ulnar styloid process 20.3  Just proximal to ulnar styloid process 15.2  Across hand at thumb web space   At base of 2nd digit 6.0  (Blank rows = not tested)  LANDMARK LEFT  04/06/2022  10 cm proximal to olecranon process 27.8  Olecranon process 24.5  10 cm proximal to ulnar styloid process 19.7  Just proximal to ulnar styloid process 15.3  Across hand at thumb web space   At base of 2nd digit 5.8  (Blank rows = not tested)      QUICK DASH SURVEY: NA   TODAY'S TREATMENT  04/27/2022 MANUAL Soft tissue mobilization to pecs, lats and UT to decrease tissue tension with cocoa butter. PT continued MLD to the left breast with education to pt about technique and sequence and pt practicing all techniques as well. Supraclavicular, 5 breaths, bilateral axillary LN's, left inguinal LN's, and anterior interaxillary pathway , left axillo-inguinal pathway, and left breast directing to pathways and retracing all steps and ending with LN's,  Pt was instructed in all techniques and practiced each with intermittent VC's and TC's. THERAPEUTIC EXERCISES: Lower trunk rotation with knees to right  to stretch left lateral trunk/pectorals done with arms at chest height and goal post position. Added to HEP   04/25/2022 Pt tried on her new compression bra and we adjusted straps and hooks to snug it up some. She didn't like it as well as her regular bra but we discussed trying it at night to see if lateral breast pain improves. PT initiated MLD to the left breast with education about lymphatics and showing diagrams. Supraclavicular, 5 breaths, bilateral axillary LN's, left inguinal LN's, and anterior interaxillary pathway , left axillo-inguinal pathway, and left breast directing to pathways and retracing all steps and ending with LN's, Pt was instructed in all techniques and practiced each with intermittent VC's and TC's. Pt was given a handout for self MLD     04/06/2022 Educated pt in compression bras and gave her a script and flyer to go to [facility] [place]. Discussed flexitouch and gave her a brochure, but pt prefers to wait until after she has learned MLD to decide it she wants it  PATIENT EDUCATION:  Education details:Access Code: [email or web address] URL: [email or web address] Date: 04/27/2022 Prepared by: Cheral Almas  Exercises - Supine Lower Trunk Rotation  - 1-2 x daily - 7 x weekly - 3-5 reps - 5-10 hold   Person educated: Patient Education method: Explanation Education comprehension: verbalized understanding   HOME EXERCISE PROGRAM: Self breast MLD  ASSESSMENT:  CLINICAL IMPRESSION: Pt does not care for her new compression bra and is wearing other bras instead.  She felt a good stretch with lower trunk rotation and this was added to her HEP. Continued MLD to left breast and instructed pt. Pt could reach better with head of table elevated to near sitting position. Tightness noted in lateral trunk and axillary border  of pecs with STM.  OBJECTIVE IMPAIRMENTS decreased knowledge of condition and increased edema.   ACTIVITY LIMITATIONS  no functional limitations other than sleeping on left side can be uncomfortable on the breast .   PERSONAL FACTORS 1-2 comorbidities: breast cancer s/p radiation, MS  are also affecting patient's functional outcome.    REHAB POTENTIAL: Excellent  CLINICAL DECISION MAKING: Stable/uncomplicated  EVALUATION COMPLEXITY: Low  GOALS: Goals reviewed with patient? Yes  SHORT TERM GOALS: Target date: 04/27/2022  Pt will report 25 percent improvement in left breast swelling Baseline: Goal status: INITIAL  2.  Pt will purchase compression bra to assist with left breast swelling/heaviness Baseline:  Goal status: INITIAL    LONG TERM GOALS: Target date: 06/08/2022  Pt will note atleast 50% improvement in left breast swelling Baseline:  Goal status: INITIAL  2.  Pt will be independent in left breast MLD Baseline:  Goal status: INITIAL  3.  Pt will be able to lie on left side without left breat pain. Baseline:  Goal status: INITIAL  PLAN: PT FREQUENCY: 1-2x/week  PT DURATION: 6 weeks  PLANNED INTERVENTIONS: Therapeutic exercises, Therapeutic activity, Patient/Family education, Manual lymph drainage, Vasopneumatic device, and Manual therapy  PLAN FOR NEXT SESSION: Foam for under band of bra?( Forgotten last visit)continue MLD to left breast and continue instructing pt. She is not interested in Flexitouch yet but may be after learning MLD, STM to left pectorals/lateral trunk, review LTR, add stargazer    Claris Pong, PT 04/27/2022, 12:02 PM

## 2022-05-02 ENCOUNTER — Ambulatory Visit: Payer: BC Managed Care – PPO

## 2022-05-02 DIAGNOSIS — Z170 Estrogen receptor positive status [ER+]: Secondary | ICD-10-CM

## 2022-05-02 DIAGNOSIS — Z483 Aftercare following surgery for neoplasm: Secondary | ICD-10-CM

## 2022-05-02 DIAGNOSIS — R600 Localized edema: Secondary | ICD-10-CM | POA: Diagnosis not present

## 2022-05-02 DIAGNOSIS — R293 Abnormal posture: Secondary | ICD-10-CM

## 2022-05-02 NOTE — Patient Instructions (Signed)
Access Code: [email or web address] URL: [email or web address] Date: 05/02/2022 Prepared by: Cheral Almas  Exercises - Doorway Pec Stretch at 90 Degrees Abduction  - 1 x daily - 7 x weekly - 1 sets - 3 reps - 20 hold - Single Arm Doorway Pec Stretch at 60 Elevation  - 1 x daily - 7 x weekly - 1 sets - 3 reps - 20 hold - Step Back Shoulder Stretch with Chair  - 1 x daily - 7 x weekly - 1 sets - 3 reps - 20 hold

## 2022-05-02 NOTE — Therapy (Addendum)
OUTPATIENT PHYSICAL THERAPY ONCOLOGY TREATMENT  Patient Name: Joyce Bennett MRN: [id number] DOB:05/19/1956, 65 y.o., female Today's Date: 05/02/2022   PT End of Session - 05/02/22 1103     Visit Number 3    Number of Visits 12    Date for PT Re-Evaluation 05/18/22    PT Start Time 1105        PT Stop Time                                                              1157      PT time calculation                                                       52 minutes   Activity tolerance                                                                     Tolerated treatment well  Behavior during therapy                                                          WFL for tasks  Past Medical History:  Diagnosis Date   Breast cancer ([facility])    Family history of breast cancer 08/10/2021   Family history of prostate cancer 08/10/2021   History of radiation therapy    left breast - 10/16/2021 - 11/17/2021 - Dr James Kinard   Multiple sclerosis ([facility])    Neurogenic bladder    Optic neuritis, left    Restless leg syndrome    Urinary tract infection    Recurrent   Past Surgical History:  Procedure Laterality Date   BREAST LUMPECTOMY WITH RADIOACTIVE SEED AND SENTINEL LYMPH NODE BIOPSY Left 09/01/2021   Procedure: LEFT BREAST LUMPECTOMY WITH RADIOACTIVE SEED AND SENTINEL LYMPH NODE BIOPSY;  Surgeon: Toth, Paul III, MD;  Location: [facility];  Service: General;  Laterality: Left;   FOOT SURGERY N/A    HAND SURGERY Right    Patient Active Problem List   Diagnosis Date Noted   Genetic testing 08/24/2021   Family history of breast cancer 08/10/2021   Family history of prostate cancer 08/10/2021   Malignant neoplasm of lower-outer quadrant of left breast of female, estrogen receptor positive ([facility]) 08/03/2021   Immunodeficiency due to drugs ([facility]) 12/06/2020   Multiple sclerosis ([facility]) 07/28/2012    PCP: Dr. Manfredi  REFERRING PROVIDER: Causey, Lindsay NP  REFERRING DIAG: left  breast swelling  THERAPY DIAG:  Localized edema  Aftercare following surgery for neoplasm  Malignant neoplasm of upper-outer quadrant of left breast in female, estrogen receptor positive ([facility])  Abnormal posture  ONSET DATE: 11/2021  SUBJECTIVE                                                                                                                                                                                             SUBJECTIVE STATEMENT:  The shooting pains are gone and I like the trunk rotation stretch. It seems to help the area that was bothering me. The soreness is improving  The manual therapy seemed to help loosen me up. PERTINENT HISTORY:   09/01/2021 Surgery    Left lumpectomy: Focal residual DCIS high-grade, no residual invasive ductal carcinoma, resection margins negative, 0/4 lymph nodes negative, tumor size: 0.1 cm, ER 95%, PR 0%, HER2 1+, Ki-67 50%. Pt had radiation following surgery   PAIN:  Are you having pain? No just sore/tenderness NPRS scale: 0/10 Pain location: left breast Pain orientation: Left  PAIN TYPE: sharp Pain description: sharp  Aggravating factors: looser bra maybe,  Relieving factors: tighter more supportive bra  PRECAUTIONS: Other: lymphedema risk, MS, prior right hand surgery  WEIGHT BEARING RESTRICTIONS No  FALLS:  Has patient fallen in last 6 months? No  LIVING ENVIRONMENT: Lives with: lives with their spouse and lives with their daughter Lives in: House/apartment Stairs: Yes; Internal: 14 steps; on right going up and on left going up Has following equipment at home: None  OCCUPATION: Drives school bus  LEISURE: walks occasionally  HAND DOMINANCE : right   PRIOR LEVEL OF FUNCTION: Independent  PATIENT GOALS Get some suggestions so it isn't so tender   OBJECTIVE  COGNITION:  Overall cognitive status: Within functional limits for tasks assessed   PALPATION: Mild thickening of skin left breast secondary to  radiation. No fibrosis noted at incisions sites. 1 small area of tenderness above nipple, but otherwise non-tender.  OBSERVATIONS / OTHER ASSESSMENTS: Large breasted with more pronounced indentations from lace bra on the left.  Swelling noted at lateral breast/trunk region  SENSATION:  Light touch: Appears intact    POSTURE: forward head, rounded shoulders  UPPER EXTREMITY AROM/PROM:  A/PROM RIGHT  04/06/2022   Shoulder extension WNL  Shoulder flexion 150  Shoulder abduction 165  Shoulder internal rotation   Shoulder external rotation     (Blank rows = not tested)  A/PROM LEFT  04/06/2022  Shoulder extension WNL  Shoulder flexion 150  Shoulder abduction 165  Shoulder internal rotation   Shoulder external rotation     (Blank rows = not tested)   CERVICAL AROM: All within functional limits:     UPPER EXTREMITY STRENGTH: WNL   LYMPHEDEMA ASSESSMENTS:   SURGERY TYPE/DATE: 09/01/2021, Left lumpectomy with SLNB  NUMBER OF LYMPH NODES REMOVED: 0/4  CHEMOTHERAPY: no  RADIATION:completed 12/22  HORMONE TREATMENT: yes  INFECTIONS: no  LYMPHEDEMA ASSESSMENTS:   LANDMARK RIGHT  04/06/2022  10 cm proximal to olecranon process 28.3  Olecranon process 24.5  10 cm proximal to ulnar styloid process 20.3  Just proximal to ulnar styloid process 15.2  Across hand at thumb web space   At base of 2nd digit 6.0  (Blank rows = not tested)  LANDMARK LEFT  04/06/2022  10 cm proximal to olecranon process 27.8  Olecranon process 24.5  10 cm proximal to ulnar styloid process 19.7  Just proximal to ulnar styloid process 15.3  Across hand at thumb web space   At base of 2nd digit 5.8  (Blank rows = not tested)      QUICK DASH SURVEY: NA   TODAY'S TREATMENT   05/02/2022 Soft tissue mobilization to pecs, lats and UT in supine and in right SL to UT and periscapular area and lats to decrease tissue tension with cocoa butter. PROM to left shoulder flexion, scaption, abduction  with MFR at axilla. LTR with  arms  outstretched and goal post position x 5 ea Educated in standing pec stretch single and bilateral UE in doorway, and standing lat stretch at [employer] and updated HEP. Answered pt questions about breast lymphedema and direction she is moving for medial vs lateral breast Foam pad covered in TG soft made for lower border of bra to decrease discomfort. 04/27/2022 MANUAL Soft tissue mobilization to pecs, lats and UT to decrease tissue tension with cocoa butter. PT continued MLD to the left breast with education to pt about technique and sequence and pt practicing all techniques as well. Supraclavicular, 5 breaths, bilateral axillary LN's, left inguinal LN's, and anterior interaxillary pathway , left axillo-inguinal pathway, and left breast directing to pathways and retracing all steps and ending with LN's, Pt was instructed in all techniques and practiced each with intermittent VC's and TC's. THERAPEUTIC EXERCISES: Lower trunk rotation with knees to right to stretch left lateral trunk/pectorals done with arms at chest height and goal post position. Added to HEP   04/25/2022 Pt tried on her new compression bra and we adjusted straps and hooks to snug it up some. She didn't like it as well as her regular bra but we discussed trying it at night to see if lateral breast pain improves. PT initiated MLD to the left breast with education about lymphatics and showing diagrams. Supraclavicular, 5 breaths, bilateral axillary LN's, left inguinal LN's, and anterior interaxillary pathway , left axillo-inguinal pathway, and left breast directing to pathways and retracing all steps and ending with LN's, Pt was instructed in all techniques and practiced each with intermittent VC's and TC's. Pt was given a handout for self MLD     04/06/2022 Educated pt in compression bras and gave her a script and flyer to go to [facility] [employer]. Discussed flexitouch and gave her a brochure, but pt  prefers to wait until after she has learned MLD to decide it she wants it  PATIENT EDUCATION:  Access Code: [email or web address] URL: [email or web address] Date: 05/02/2022 Prepared by: Cheral Almas   Exercises - Doorway Pec Stretch at 90 Degrees Abduction  - 1 x daily - 7 x weekly - 1 sets - 3 reps - 20 hold - Single Arm Doorway Pec Stretch at 60 Elevation  - 1 x daily - 7 x weekly - 1 sets - 3 reps - 20 hold - Step Back Shoulder Stretch with Chair  - 1 x daily - 7 x weekly - 1 sets - 3 reps - 20 hold Person educated: Patient Education method: Explanation Education comprehension: verbalized understanding   HOME EXERCISE PROGRAM: Self breast MLD, Lower trunk rotation, Pec doorway stretch single and double arm and standing lat stretch.  ASSESSMENT:  CLINICAL IMPRESSION: Pt with continued tightness in pectorals and lats as noted with soft tissue mobilization, however, pt reports tenderness although still present is improving especially with LTR stretch. Updated HEP with more stretches and pt will work on over the weekend. She is compliant with MLD but needs review/  OBJECTIVE IMPAIRMENTS decreased knowledge of condition and increased edema.   ACTIVITY LIMITATIONS  no functional limitations other than sleeping on left side can be uncomfortable on the breast .   PERSONAL FACTORS 1-2 comorbidities: breast cancer s/p radiation, MS  are also affecting patient's functional outcome.    REHAB POTENTIAL: Excellent  CLINICAL DECISION MAKING: Stable/uncomplicated  EVALUATION COMPLEXITY: Low  GOALS: Goals reviewed with patient? Yes  SHORT TERM GOALS: Target date: 04/27/2022  Pt will report 25 percent improvement in left breast swelling  Baseline: Goal status: INITIAL  2.  Pt will purchase compression bra to assist with left breast swelling/heaviness Baseline:  Goal status: INITIAL    LONG TERM GOALS: Target date: 06/13/2022  Pt will note atleast 50% improvement in left breast  swelling Baseline:  Goal status: INITIAL  2.  Pt will be independent in left breast MLD Baseline:  Goal status: INITIAL  3.  Pt will be able to lie on left side without left breat pain. Baseline:  Goal status: INITIAL  PLAN: PT FREQUENCY: 1-2x/week  PT DURATION: 6 weeks  PLANNED INTERVENTIONS: Therapeutic exercises, Therapeutic activity, Patient/Family education, Manual lymph drainage, Vasopneumatic device, and Manual therapy  PLAN FOR NEXT SESSION: review MLD to left breast and continue instructing pt. She is not interested in Flexitouch yet but may be after learning MLD, STM to left pectorals/lateral trunk, , add stargazer, AROM, scap series  PHYSICAL THERAPY DISCHARGE SUMMARY  Visits from Start of Care: 3  Current functional level related to goals / functional outcomes: Unknown. Pt called to cancel remaining appts with no reason given   Remaining deficits: Unknown   Education / Equipment: None   Patient agrees to discharge. Patient goals were  not assessed . Patient is being discharged due to the patient's request.   Claris Pong, PT 05/02/2022, 12:04 PM

## 2022-06-11 ENCOUNTER — Ambulatory Visit: Payer: BC Managed Care – PPO | Attending: General Surgery

## 2022-06-11 VITALS — Wt 174.0 lb

## 2022-06-11 DIAGNOSIS — Z483 Aftercare following surgery for neoplasm: Secondary | ICD-10-CM | POA: Insufficient documentation

## 2022-06-11 NOTE — Therapy (Signed)
  OUTPATIENT PHYSICAL THERAPY [facility] SCREENING NOTE   Patient Name: Joyce Bennett MRN: [id number] DOB:07/30/1956, 65 y.o., female Today's Date: 06/11/2022  PCP: [name], MD REFERRING PROVIDER: Jovita Kussmaul, MD   PT End of Session - 06/11/22 1545     Visit Number 3   # unchanged due to screen only   PT Start Time 1    PT Stop Time 1547    PT Time Calculation (min) 4 min    Activity Tolerance Patient tolerated treatment well    Behavior During Therapy [facility] for tasks assessed/performed             Past Medical History:  Diagnosis Date   Breast cancer ([place])    Family history of breast cancer 08/10/2021   Family history of prostate cancer 08/10/2021   History of radiation therapy    left breast - 10/16/2021 - 11/17/2021 - Dr Gery Pray   Multiple sclerosis [facility])    Neurogenic bladder    Optic neuritis, left    Restless leg syndrome    Urinary tract infection    Recurrent   Past Surgical History:  Procedure Laterality Date   BREAST LUMPECTOMY WITH RADIOACTIVE SEED AND SENTINEL LYMPH NODE BIOPSY Left 09/01/2021   Procedure: LEFT BREAST LUMPECTOMY WITH RADIOACTIVE SEED AND SENTINEL LYMPH NODE BIOPSY;  Surgeon: Jovita Kussmaul, MD;  Location: [place];  Service: General;  Laterality: Left;   FOOT SURGERY N/A    HAND SURGERY Right    Patient Active Problem List   Diagnosis Date Noted   Genetic testing 08/24/2021   Family history of breast cancer 08/10/2021   Family history of prostate cancer 08/10/2021   Malignant neoplasm of lower-outer quadrant of left breast of female, estrogen receptor positive ([place]) 08/03/2021   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 She underwent a left lumpectomy and sentinel node biopsy (4 negative nodes) on 09/01/2021. It is ER positive, PR negative, and HER2 negative with a Ki67 of 50%. She had right hand surgery 40 years ago with hardware placed but it has since been removed  PRECAUTIONS: left UE Lymphedema risk, None  SUBJECTIVE: Pt returns for her 3 month L-Dex screen.   PAIN:  Are you having pain? No  SOZO SCREENING: Patient was assessed today using the SOZO machine to determine the lymphedema index score. This was compared to her baseline score. It was determined that she is within the recommended range when compared to her baseline and no further action is needed at this time. She will continue SOZO screenings. These are done every 3 months for 2 years post operatively followed by every 6 months for 2 years, and then annually.    Otelia Limes, PTA 06/11/2022, 3:47 PM

## 2022-06-19 ENCOUNTER — Other Ambulatory Visit: Payer: BC Managed Care – PPO

## 2022-07-04 ENCOUNTER — Other Ambulatory Visit: Payer: BC Managed Care – PPO

## 2022-07-11 ENCOUNTER — Other Ambulatory Visit: Payer: BC Managed Care – PPO

## 2022-07-27 ENCOUNTER — Other Ambulatory Visit: Payer: Self-pay

## 2022-07-27 ENCOUNTER — Emergency Department (HOSPITAL_COMMUNITY)
Admission: EM | Admit: 2022-07-27 | Discharge: 2022-07-27 | Disposition: A | Discharge status: Home / Self Care | Payer: BC Managed Care – PPO | Attending: Emergency Medicine | Admitting: Emergency Medicine

## 2022-07-27 DIAGNOSIS — H938X1 Other specified disorders of right ear: Secondary | ICD-10-CM | POA: Diagnosis present

## 2022-07-27 MED ORDER — IBUPROFEN 200 MG PO TABS: 400.0000 mg | ORAL_TABLET | Freq: Once | ORAL | Status: DC

## 2022-07-27 MED ORDER — AZITHROMYCIN 250 MG PO TABS: 250.0000 mg | ORAL_TABLET | Freq: Every day | ORAL | 0 refills | Status: DC

## 2022-07-27 MED ORDER — FLUTICASONE PROPIONATE 50 MCG/ACT NA SUSP: 2.0000 | Freq: Every day | NASAL | 0 refills | Status: AC

## 2022-07-27 NOTE — ED Notes (Signed)
I provided reinforced discharge education based off of after visit summary/care provided. Pt acknowledged and understood my education. Pt had no further questions/concerns for provider/myself. After visit summary provided to pt. 

## 2022-07-27 NOTE — ED Provider Notes (Signed)
[facility] DEPT Provider Note   CSN: [id number] Arrival date & time: 07/27/22  0008     History  Chief Complaint  Patient presents with   Ear Fullness    Joyce Bennett is a 66 y.o. female.  The history is provided by the patient.  Ear Fullness This is a recurrent problem. The current episode started 12 to 24 hours ago. The problem occurs constantly. The problem has been resolved. Pertinent negatives include no chest pain, no abdominal pain, no headaches and no shortness of breath. Nothing aggravates the symptoms. Nothing relieves the symptoms. She has tried nothing for the symptoms. The treatment provided no relief.  Fullness not pain in the right ear, had a tube placed by ENT for same in the past.       Home Medications Prior to Admission medications   Medication Sig Start Date End Date Taking? Authorizing Provider  azithromycin (ZITHROMAX Z-PAK) 250 MG tablet Take 1 tablet (250 mg total) by mouth daily. 2 tabs on day one then one tab daily for 4 additional days 07/27/22  Yes Evangelina Delancey, MD  fluticasone (FLONASE) 50 MCG/ACT nasal spray Place 2 sprays into both nostrils daily. 07/27/22  Yes Dave Mergen, MD  BETASERON 0.3 MG KIT injection ADD 1.2 ML DILUENT TO VIAL AND MIX GENTLY. INJECT 1 ML SUBCUTANEOUSLY EVERY OTHER DAY. USE WITHIN 3 HOURS OF MIXING. STORE AT ROOM TEMPERATURE. 04/09/22   Ward Givens, NP  CALCIUM PO Take by mouth daily.    [provider]  exemestane (AROMASIN) 25 MG tablet TAKE 1 TABLET (25 MG TOTAL) BY MOUTH DAILY AFTER BREAKFAST. 03/29/22   Nicholas Lose, MD  fluticasone (FLONASE) 50 MCG/ACT nasal spray Place 1 spray into both nostrils 2 (two) times daily.    [provider]  Multiple Vitamin (MULTIVITAMIN) tablet Take 1 tablet by mouth daily.    [provider]  Multiple Vitamins-Minerals (ZINC PO) Take 1 tablet by mouth daily.    [provider]  olopatadine (PATANOL) 0.1 %  ophthalmic solution Place 1 drop into both eyes 2 (two) times daily.    [provider]  trimethoprim (TRIMPEX) 100 MG tablet Take 1 tablet (100 mg total) by mouth at bedtime as needed. 11/27/21   Nicholas Lose, MD      Allergies    Keflex [cephalexin] and Shellfish allergy    Review of Systems   Review of Systems  Constitutional:  Negative for fever.  HENT:  Negative for ear discharge, ear pain and facial swelling.   Respiratory:  Negative for shortness of breath, wheezing and stridor.   Cardiovascular:  Negative for chest pain.  Gastrointestinal:  Negative for abdominal pain.  Neurological:  Negative for headaches.  All other systems reviewed and are negative.   Physical Exam Updated Vital Signs BP (!) 155/100 (BP Location: Left Arm)   Pulse 86   Temp 98.7 F (37.1 C) (Oral)   Resp 18   SpO2 98%  Physical Exam Vitals and nursing note reviewed.  Constitutional:      General: She is not in acute distress.    Appearance: She is well-developed.  HENT:     Head: Normocephalic and atraumatic.     Right Ear: Tympanic membrane and ear canal normal.     Left Ear: Tympanic membrane and ear canal normal.     Nose: Nose normal.  Eyes:     Pupils: Pupils are equal, round, and reactive to light.  Cardiovascular:  Rate and Rhythm: Normal rate and regular rhythm.     Pulses: Normal pulses.     Heart sounds: Normal heart sounds.  Pulmonary:     Effort: Pulmonary effort is normal. No respiratory distress.     Breath sounds: Normal breath sounds.  Abdominal:     General: Bowel sounds are normal. There is no distension.     Palpations: Abdomen is soft.     Tenderness: There is no abdominal tenderness. There is no guarding or rebound.  Genitourinary:    Vagina: No vaginal discharge.  Musculoskeletal:        General: Normal range of motion.     Cervical back: Normal range of motion and neck supple.  Skin:    General: Skin is dry.     Capillary Refill: Capillary refill  takes less than 2 seconds.     Findings: No erythema or rash.  Neurological:     General: No focal deficit present.     Mental Status: She is oriented to person, place, and time.     Deep Tendon Reflexes: Reflexes normal.  Psychiatric:        Mood and Affect: Mood normal.     ED Results / Procedures / Treatments   Labs (all labs ordered are listed, but only abnormal results are displayed) Labs Reviewed - No data to display  EKG None  Radiology No results found.  Procedures Procedures    Medications Ordered in ED Medications  ibuprofen (ADVIL) tablet 400 mg (has no administration in time range)    ED Course/ Medical Decision Making/ A&P                           Medical Decision Making Ear fullness no pain for less than 24 hours.  T tube is not in   Amount and/or Complexity of Data Reviewed External Data Reviewed: notes.    Details: previous notes reviewed  Risk OTC drugs. Prescription drug management. Risk Details: No signs of infection at this time.  Use flonase to decongest.  RX for zpak given if symptoms worsen.  Follow up with ENT if symptoms worse.  Strict return.     Final Clinical Impression(s) / ED Diagnoses Final diagnoses:  Sensation of fullness in right ear   Return for intractable cough, coughing up blood, fevers > 100.4 unrelieved by medication, shortness of breath, intractable vomiting, chest pain, shortness of breath, weakness, numbness, changes in speech, facial asymmetry, abdominal pain, passing out, Inability to tolerate liquids or food, cough, altered mental status or any concerns. No signs of systemic illness or infection. The patient is nontoxic-appearing on exam and vital signs are within normal limits.  I have reviewed the triage vital signs and the nursing notes. Pertinent labs & imaging results that were available during my care of the patient were reviewed by me and considered in my medical decision making (see chart for details). After  history, exam, and medical workup I feel the patient has been appropriately medically screened and is safe for discharge home. Pertinent diagnoses were discussed with the patient. Patient was given return precautions.    Rx / DC Orders ED Discharge Orders          Ordered    azithromycin (ZITHROMAX Z-PAK) 250 MG tablet  Daily        07/27/22 0431    fluticasone (FLONASE) 50 MCG/ACT nasal spray  Daily        07/27/22 0431  Ashden Sonnenberg, MD 07/27/22 [phone]

## 2022-07-27 NOTE — ED Triage Notes (Signed)
Patient coming to ED for evaluation of R ear pain.  Reports "it feels full."  No c/o drainage from ear.  No reports of fever.  Decreasing hearing noted to ear

## 2022-07-28 ENCOUNTER — Other Ambulatory Visit: Payer: Self-pay

## 2022-07-28 ENCOUNTER — Emergency Department (HOSPITAL_COMMUNITY)
Admission: EM | Admit: 2022-07-28 | Discharge: 2022-07-28 | Disposition: A | Discharge status: Home / Self Care | Payer: BC Managed Care – PPO | Attending: Emergency Medicine | Admitting: Emergency Medicine

## 2022-07-28 ENCOUNTER — Emergency Department (HOSPITAL_COMMUNITY): Payer: BC Managed Care – PPO

## 2022-07-28 ENCOUNTER — Encounter (HOSPITAL_COMMUNITY): Payer: Self-pay | Admitting: Emergency Medicine

## 2022-07-28 DIAGNOSIS — R42 Dizziness and giddiness: Secondary | ICD-10-CM | POA: Insufficient documentation

## 2022-07-28 DIAGNOSIS — Z853 Personal history of malignant neoplasm of breast: Secondary | ICD-10-CM | POA: Diagnosis not present

## 2022-07-28 DIAGNOSIS — H938X1 Other specified disorders of right ear: Secondary | ICD-10-CM | POA: Insufficient documentation

## 2022-07-28 DIAGNOSIS — R5383 Other fatigue: Secondary | ICD-10-CM | POA: Diagnosis present

## 2022-07-28 LAB — CBC
HCT: 37.2 % (ref 36.0–46.0)
Hemoglobin: 11.3 g/dL — ABNORMAL LOW (ref 12.0–15.0)
MCH: 28.3 pg (ref 26.0–34.0)
MCHC: 30.4 g/dL (ref 30.0–36.0)
MCV: 93.2 fL (ref 80.0–100.0)
Platelets: 356 10*3/uL (ref 150–400)
RBC: 3.99 MIL/uL (ref 3.87–5.11)
RDW: 13.2 % (ref 11.5–15.5)
WBC: 4.8 10*3/uL (ref 4.0–10.5)
nRBC: 0.4 % — ABNORMAL HIGH (ref 0.0–0.2)

## 2022-07-28 LAB — URINALYSIS, ROUTINE W REFLEX MICROSCOPIC
Bilirubin Urine: NEGATIVE
Glucose, UA: NEGATIVE mg/dL
Hgb urine dipstick: NEGATIVE
Ketones, ur: NEGATIVE mg/dL
Nitrite: NEGATIVE
Protein, ur: NEGATIVE mg/dL
Specific Gravity, Urine: 1.014 (ref 1.005–1.030)
pH: 5 (ref 5.0–8.0)

## 2022-07-28 LAB — BASIC METABOLIC PANEL
Anion gap: 8 (ref 5–15)
BUN: 21 mg/dL (ref 8–23)
CO2: 25 mmol/L (ref 22–32)
Calcium: 9.2 mg/dL (ref 8.9–10.3)
Chloride: 106 mmol/L (ref 98–111)
Creatinine, Ser: 1.16 mg/dL — ABNORMAL HIGH (ref 0.44–1.00)
GFR, Estimated: 52 mL/min — ABNORMAL LOW (ref >60)
Glucose, Bld: 98 mg/dL (ref 70–99)
Potassium: 4.2 mmol/L (ref 3.5–5.1)
Sodium: 139 mmol/L (ref 135–145)

## 2022-07-28 LAB — CBG MONITORING, ED: Glucose-Capillary: 89 mg/dL (ref 70–99)

## 2022-07-28 MED ORDER — CETIRIZINE HCL 10 MG PO TABS: 10.0000 mg | ORAL_TABLET | Freq: Every day | ORAL | 0 refills | Status: DC

## 2022-07-28 MED ORDER — GADOBUTROL 1 MMOL/ML IV SOLN
7.5000 mL | Freq: Once | INTRAVENOUS | Status: AC | PRN
Start: 2022-07-28 — End: 2022-07-28
  Administered 2022-07-28: 7.5 mL via INTRAVENOUS

## 2022-07-28 MED ORDER — MECLIZINE HCL 25 MG PO TABS
25.0000 mg | ORAL_TABLET | Freq: Once | ORAL | Status: AC
  Administered 2022-07-28: 25 mg via ORAL
  Filled 2022-07-28: qty 1

## 2022-07-28 NOTE — ED Notes (Signed)
Not in room

## 2022-07-28 NOTE — ED Notes (Signed)
Patient transported to MRI 

## 2022-07-28 NOTE — ED Provider Notes (Signed)
[facility] Emergency Department Provider Note MRN:  [id number]  Arrival date & time: 07/28/22     Chief Complaint   Fatigue   History of Present Illness   Joyce Bennett is a 66 y.o. year-old female with h/o breast cancer and MS presents to the ED with chief complaint of dizziness.  Patient reports that last night she was lying down in her bed when she started to feel dizzy and stated that the room felt like it was spinning.  She denies headache, chest pain, shortness of breath.  States that the dizziness comes and goes.  Denies visual disturbance.  Denies fever nausea vomiting diarrhea.  She was seen at [facility] 2 days ago for the same complaint.  Final diagnosis was ear fullness of the right ear.  She was sent home with azithromycin and fluticasone.     Review of Systems  Pertinent positive and negative review of systems noted in HPI.    Physical Exam   Vitals:   07/28/22 1403 07/28/22 1415  BP: 131/86 133/80  Pulse: 87 78  Resp: 16 16  Temp:    SpO2: 99% 100%    CONSTITUTIONAL:  well-appearing, NAD NEURO:  Speech is goal oriented. No deficits appreciated to CN III-XII; symmetric eyebrow raise, no facial drooping, tongue midline. Patient has equal grip strength bilaterally with 5/5 strength against resistance in all major muscle groups bilaterally. Sensation to light touch intact. Patient moves extremities without ataxia. Normal finger-nose-finger. Patient ambulatory with steady gait. EYES:  eyes equal and reactive ENT/NECK:  Supple, no stridor, CTAB. Right ear and left ear are normal. CARDIO:  regular rhythm, appears well-perfused  PULM:  No respiratory distress, CTAB GI/GU:  non-distended, soft MSK/SPINE:  No gross deformities, no edema, moves all extremities SKIN:  no rash, atraumatic   *Additional and/or pertinent findings included in MDM below  Diagnostic and Interventional Summary   EKG revealed NSR  Labs Reviewed  BASIC  METABOLIC PANEL - Abnormal; Notable for the following components:      Result Value   Creatinine, Ser 1.16 (*)    GFR, Estimated 52 (*)    All other components within normal limits  CBC - Abnormal; Notable for the following components:   Hemoglobin 11.3 (*)    nRBC 0.4 (*)    All other components within normal limits  URINALYSIS, ROUTINE W REFLEX MICROSCOPIC - Abnormal; Notable for the following components:   APPearance HAZY (*)    Leukocytes,Ua LARGE (*)    Bacteria, UA FEW (*)    All other components within normal limits  CBG MONITORING, ED    MR Brain W and Wo Contrast  Final Result      Medications  meclizine (ANTIVERT) tablet 25 mg (25 mg Oral Given 07/28/22 1240)  gadobutrol (GADAVIST) 1 MMOL/ML injection 7.5 mL (7.5 mLs Intravenous Contrast Given 07/28/22 1531)     Procedures  /  Critical Care Procedures  ED Course and Medical Decision Making  I have reviewed the triage vital signs, the nursing notes, and pertinent available records from the EMR.  Social Determinants Affecting Complexity of Care: Patient has no clinically significant social determinants affecting this chief complaint..   ED Course: Presenting for dizziness.  Differential diagnosis includes stroke, mass occupying lesion, carotid dissection , labyrinthitis, and BPPV was seen at [facility] long 2 nights ago for the same complaint.  Vitals, EKG, labs have all been reassuring.  Neuro exam was unremarkable.  Considered carotid dissection but unlikely given  negative for nausea or vomiting, visual disturbances, ataxia, facial paresthesia.  Given persistence of complaint and recent h/o of malignancy, concern for posterior cerebral vascular pathology or mass occupying lesion cannot be ruled out at this time.  Gave meclizine to treat symptoms. Ordered MRI of the brain.  Medical Decision Making Amount and/or Complexity of Data Reviewed External Data Reviewed: labs, radiology and notes. Labs: ordered. Decision-making  details documented in ED Course.    Details: bacturia, anemia Radiology: ordered and independent interpretation performed. Decision-making details documented in ED Course.    Details: 1. No acute intracranial pathology. 2. Periventricular FLAIR hyperintensity consistent with multiple sclerosis. The burden of disease is overall not significantly changed since 2020, with no new or enhancing lesions to suggest active demyelination. ECG/medicine tests: ordered and independent interpretation performed. Decision-making details documented in ED Course.    Details: Ordered meclizine for dizziness. Upon reevaluation, patient stated that dizziness did improve.. EKG revealed normal sinus rhythm  Risk OTC drugs.      Overall work-up for dizziness was reassuring.  MRI revealed no acute concerns for stroke or mass occupying lesion but did reveal FLAIR hyperintensity consistent with MS but no new or enhancing lesions to suggest active demyelination.  Discussed return precautions.  Patient did report that she does not drink many fluids during the day and we discussed how this may be contributing to her dizziness and fatigue.  I encouraged good hydration and nutrition at home moving forward.  Also recommended her to follow-up with her PCP regarding ongoing dizziness and ear fullness.  Prescribed Zyrtec to be taken for ear fullness.  Consultants: No consultations were needed in caring for this patient.   Treatment and Plan: Emergency department workup does not suggest an emergent condition requiring admission or immediate intervention beyond  what has been performed at this time. The patient is safe for discharge and has  been instructed to return immediately for worsening symptoms, change in  symptoms or any other concerns    Final Clinical Impressions(s) / ED Diagnoses     ICD-10-CM   1. Fatigue, unspecified type  R53.83     2. Dizziness  R42     3. Ear fullness, right  H93.8X1       ED  Discharge Orders          Ordered    cetirizine (ZYRTEC ALLERGY) 10 MG tablet  Daily        07/28/22 1631              Discharge Instructions Discussed with and Provided to Patient:     Discharge Instructions      Diagnosed with dizziness, right ear fullness and, fatigue.  MRI of your brain was negative for acute pathology but did reveal stable chronic lesions related to MS. Ordered Zyrtec to be taken for the next 10 days to treat ear fullness.  If symptoms improve, you may stop taking the medication.  If new headache, chest pain, shortness of breath, altered mental status, loss of sensation and range of motion of your extremities please return to the emergency department for further evaluation.        Harriet Pho, PA-C 07/28/22 1641    Valarie Merino, MD 07/30/22 [phone]

## 2022-07-28 NOTE — ED Triage Notes (Signed)
Patient here for feeling "woozy".  She states that she has been feeling like this all day.  Patient states that EMS came to the house, her blood pressure was 180/108.  Patient now with 165/99.  Patient seen yesterday for ear pain and was given Flonase and a Z-Pak.  She denies any chest pain or feeling syncopal.  She denies any dizziness.

## 2022-07-28 NOTE — Discharge Instructions (Addendum)
Diagnosed with dizziness, right ear fullness and, fatigue.  MRI of your brain was negative for acute pathology but did reveal stable chronic lesions related to MS. Ordered Zyrtec to be taken for the next 10 days to treat ear fullness.  If symptoms improve, you may stop taking the medication.  If new headache, chest pain, shortness of breath, altered mental status, loss of sensation and range of motion of your extremities please return to the emergency department for further evaluation.

## 2022-07-30 ENCOUNTER — Telehealth: Payer: Self-pay | Admitting: Adult Health

## 2022-07-30 NOTE — Telephone Encounter (Signed)
We ordered a MRI for her in May, she kept rescheduling it and pushed it into October. She left me a voice mail that she went to the ED and got the MRI brain w/wo done 07/28/22 and to cancel the one in October with [place]. She asked for our office to follow up with her please.

## 2022-07-31 NOTE — Telephone Encounter (Signed)
I called the patient and LVM (Ok per [facility]) advising per MM NP that her MRI brain shows no active lesions and is stable compared to the scan in 2020. Let office number for call back if needed.

## 2022-07-31 NOTE — Telephone Encounter (Signed)
Please let patient know I reviewed her MRI of the brain there was no active lesions stable compared to scan in 2020

## 2022-08-23 ENCOUNTER — Other Ambulatory Visit: Payer: Self-pay | Admitting: Hematology and Oncology

## 2022-08-23 ENCOUNTER — Telehealth: Payer: Self-pay

## 2022-08-23 NOTE — Telephone Encounter (Signed)
Called pt and LVM requesting call back to discuss if she is still taking her exemestane and if she needs refills as we received a refill request from her phx.

## 2022-09-10 ENCOUNTER — Other Ambulatory Visit: Payer: Self-pay | Admitting: *Deleted

## 2022-09-10 MED ORDER — EXEMESTANE 25 MG PO TABS: 25.0000 mg | ORAL_TABLET | Freq: Every day | ORAL | 1 refills | Status: DC

## 2022-09-11 ENCOUNTER — Other Ambulatory Visit: Payer: Self-pay | Admitting: Family Medicine

## 2022-09-11 ENCOUNTER — Other Ambulatory Visit: Payer: BC Managed Care – PPO

## 2022-09-11 DIAGNOSIS — E2839 Other primary ovarian failure: Secondary | ICD-10-CM

## 2022-09-12 ENCOUNTER — Other Ambulatory Visit: Payer: Self-pay | Admitting: Family Medicine

## 2022-09-20 ENCOUNTER — Ambulatory Visit: Payer: BC Managed Care – PPO | Attending: General Surgery

## 2022-09-20 VITALS — Wt 170.1 lb

## 2022-09-20 DIAGNOSIS — Z483 Aftercare following surgery for neoplasm: Secondary | ICD-10-CM | POA: Insufficient documentation

## 2022-09-20 NOTE — Therapy (Signed)
  OUTPATIENT PHYSICAL THERAPY SOZO SCREENING NOTE   Patient Name: Joyce Bennett MRN: [id number] DOB:1956/05/18, 66 y.o., female Today's Date: 09/20/2022  PCP: [name], MD REFERRING PROVIDER: Jovita Kussmaul, MD   PT End of Session - 09/20/22 1056     Visit Number 3   # unchanged due to screen only   PT Start Time 96    PT Stop Time 1058    PT Time Calculation (min) 6 min    Activity Tolerance Patient tolerated treatment well    Behavior During Therapy [facility] for tasks assessed/performed             Past Medical History:  Diagnosis Date   Breast cancer ([place])    Family history of breast cancer 08/10/2021   Family history of prostate cancer 08/10/2021   History of radiation therapy    left breast - 10/16/2021 - 11/17/2021 - Dr Gery Pray   Multiple sclerosis [facility])    Neurogenic bladder    Optic neuritis, left    Restless leg syndrome    Urinary tract infection    Recurrent   Past Surgical History:  Procedure Laterality Date   BREAST LUMPECTOMY WITH RADIOACTIVE SEED AND SENTINEL LYMPH NODE BIOPSY Left 09/01/2021   Procedure: LEFT BREAST LUMPECTOMY WITH RADIOACTIVE SEED AND SENTINEL LYMPH NODE BIOPSY;  Surgeon: Jovita Kussmaul, MD;  Location: [facility];  Service: General;  Laterality: Left;   FOOT SURGERY N/A    HAND SURGERY Right    Patient Active Problem List   Diagnosis Date Noted   Genetic testing 08/24/2021   Family history of breast cancer 08/10/2021   Family history of prostate cancer 08/10/2021   Malignant neoplasm of lower-outer quadrant of left breast of female, estrogen receptor positive ([place]) 08/03/2021   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 She underwent a left lumpectomy and sentinel node biopsy (4 negative nodes) on 09/01/2021. It is ER positive, PR negative, and HER2 negative with a Ki67 of 50%. She had right hand surgery 40 years ago with hardware placed but it has since been removed  PRECAUTIONS: left UE Lymphedema risk, None  SUBJECTIVE: Pt returns for her 3 month L-Dex screen.   PAIN:  Are you having pain? No  SOZO SCREENING: Patient was assessed today using the SOZO machine to determine the lymphedema index score. This was compared to her baseline score. It was determined that she is within the recommended range when compared to her baseline and no further action is needed at this time. She will continue SOZO screenings. These are done every 3 months for 2 years post operatively followed by every 6 months for 2 years, and then annually.   L-DEX FLOWSHEETS - 09/20/22 1000       L-DEX LYMPHEDEMA SCREENING   Measurement Type Unilateral    L-DEX MEASUREMENT EXTREMITY Upper Extremity    POSITION  Standing    DOMINANT SIDE Right    At Risk Side Left    BASELINE SCORE (UNILATERAL) 4.5    L-DEX SCORE (UNILATERAL) -1.1    VALUE CHANGE (UNILAT) -5.6              Otelia Limes, PTA 09/20/2022, 10:59 AM

## 2022-09-27 NOTE — Progress Notes (Signed)
Patient Care Team: [name], MD as PCP - General (Family Medicine) Jovita Kussmaul, MD as Consulting Physician (General Surgery) Nicholas Lose, MD as Consulting Physician (Hematology and Oncology) Gery Pray, MD as Consulting Physician (Radiation Oncology) Marylynn Pearson, MD as Consulting Physician (Obstetrics and Gynecology)  DIAGNOSIS:  Encounter Diagnosis  Name Primary?   Malignant neoplasm of lower-outer quadrant of left breast of female, estrogen receptor positive ([place])     SUMMARY OF ONCOLOGIC HISTORY: Oncology History  Malignant neoplasm of lower-outer quadrant of left breast of female, estrogen receptor positive ([place])  08/03/2021 Initial Diagnosis   Left diagnostic mammogram: suspicious calcifications in the lower outer quadrant of the left breast. 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    08/09/2021 Cancer Staging   Staging form: Breast, AJCC 8th Edition - Clinical stage from 08/09/2021: Stage IA (cT52m, cN0, cM0, G2, ER+, PR-, HER2-) - Signed by GNicholas Lose MD on 08/09/2021 Stage prefix: Initial diagnosis Histologic grading system: 3 grade system   08/18/2021 Genetic Testing   Negative hereditary cancer genetic testing: no pathogenic variants detected in [facility] BRCAPlus Panel or [facility] CancerNext-Expanded +RNAinsight Panel.  The report dates are August 18, 2021 and August 25, 2021, respectively.   The BRCAplus panel offered by [facility] includes sequencing and deletion/duplication analysis for the following 8 genes: ATM, BRCA1, BRCA2, CDH1, CHEK2, PALB2, PTEN, and TP53.  The CancerNext-Expanded gene panel offered by [facility] includes sequencing, rearrangement, and RNA analysis for the following 77 genes: AIP, ALK, APC, ATM, AXIN2, BAP1, BARD1, BLM, BMPR1A, BRCA1, BRCA2, BRIP1, CDC73, CDH1, CDK4, CDKN1B, CDKN2A, CHEK2, CTNNA1, DICER1, FANCC, FH, FLCN, GALNT12, KIF1B, LZTR1, MAX, MEN1, MET, MLH1,  MSH2, MSH3, MSH6, MUTYH, NBN, NF1, NF2, NTHL1, PALB2, PHOX2B, PMS2, POT1, PRKAR1A, PTCH1, PTEN, RAD51C, RAD51D, RB1, RECQL, RET, SDHA, SDHAF2, SDHB, SDHC, SDHD, SMAD4, SMARCA4, SMARCB1, SMARCE1, STK11, SUFU, TMEM127, TP53, TSC1, TSC2, VHL and XRCC2 (sequencing and deletion/duplication); EGFR, EGLN1, HOXB13, KIT, MITF, PDGFRA, POLD1, and POLE (sequencing only); EPCAM and GREM1 (deletion/duplication only).    09/01/2021 Surgery   Left lumpectomy: Focal residual DCIS high-grade, no residual invasive ductal carcinoma, resection margins negative, 0/4 lymph nodes negative, tumor size: 0.1 cm, ER 95%, PR 0%, HER2 1+, Ki-67 50%   10/16/2021 - 11/17/2021 Radiation Therapy   Site Technique Total Dose (Gy) Dose per Fx (Gy) Completed Fx Beam Energies  Breast, Left: Breast_Lt 3D 40.05/40.05 2.67 15/15 10X  Breast, Left: Breast_Lt_Bst 3D 12/12 2 6/6 6X, 10X     11/27/2021 -  Anti-estrogen oral therapy   Started with anastrozole, developed bilateral eye irritation. Switched to exemestane 02/08/2022.     CHIEF COMPLIANT: Follow-up Dcis on Letrozole  INTERVAL HISTORY: Joyce MCGINNITYis a 66y.o with the above-mentioned. Currently on breast cancer surveillance. She presents to the clinic for a follow-up. She reports she is tolerating the letrozole. Denies increased hot flashes and some mild joint stiffness but it is tolerated. She does get some time not much for exercise.   ALLERGIES:  is allergic to keflex [cephalexin] and shellfish allergy.  MEDICATIONS:  Current Outpatient Medications  Medication Sig Dispense Refill   BETASERON 0.3 MG KIT injection ADD 1.2 ML DILUENT TO VIAL AND MIX GENTLY. INJECT 1 ML SUBCUTANEOUSLY EVERY OTHER DAY. USE WITHIN 3 HOURS OF MIXING. STORE AT ROOM TEMPERATURE. 42 kit 5   CALCIUM PO Take by mouth daily.     exemestane (AROMASIN) 25 MG tablet Take 1 tablet (25 mg total) by mouth  daily after breakfast. 90 tablet 3   fluticasone (FLONASE) 50 MCG/ACT nasal spray Place 1 spray  into both nostrils 2 (two) times daily.     fluticasone (FLONASE) 50 MCG/ACT nasal spray Place 2 sprays into both nostrils daily. 16 mL 0   Multiple Vitamin (MULTIVITAMIN) tablet Take 1 tablet by mouth daily.     Multiple Vitamins-Minerals (ZINC PO) Take 1 tablet by mouth daily.     olopatadine (PATANOL) 0.1 % ophthalmic solution Place 1 drop into both eyes 2 (two) times daily.     trimethoprim (TRIMPEX) 100 MG tablet Take 1 tablet (100 mg total) by mouth at bedtime as needed.     cetirizine (ZYRTEC ALLERGY) 10 MG tablet Take 1 tablet (10 mg total) by mouth daily for 10 days. 10 tablet 0   No current facility-administered medications for this visit.   Facility-Administered Medications Ordered in Other Visits  Medication Dose Route Frequency Provider Last Rate Last Admin   gadopentetate dimeglumine (MAGNEVIST) injection 15 mL  15 mL Intravenous Once PRN Kathrynn Ducking, MD        PHYSICAL EXAMINATION: ECOG PERFORMANCE STATUS: 1 - Symptomatic but completely ambulatory  Vitals:   10/01/22 1022  BP: 127/83  Pulse: 92  Resp: 18  Temp: 97.8 F (36.6 C)  SpO2: 99%   Filed Weights   10/01/22 1022  Weight: 171 lb 8 oz (77.8 kg)      LABORATORY DATA:  I have reviewed the data as listed    Latest Ref Rng & Units 07/28/2022    1:29 AM 04/23/2022   11:05 AM 08/09/2021   12:43 PM  CMP  Glucose 70 - 99 mg/dL 98  59  80   BUN 8 - 23 mg/dL '21  12  15   ' Creatinine 0.44 - 1.00 mg/dL 1.16  1.01  0.84   Sodium 135 - 145 mmol/L 139  142  141   Potassium 3.5 - 5.1 mmol/L 4.2  4.7  4.3   Chloride 98 - 111 mmol/L 106  103  105   CO2 22 - 32 mmol/L '25  25  27   ' Calcium 8.9 - 10.3 mg/dL 9.2  10.4  9.5   Total Protein 6.0 - 8.5 g/dL  7.8  7.6   Total Bilirubin 0.0 - 1.2 mg/dL  0.3  0.3   Alkaline Phos 44 - 121 IU/L  150  130   AST 0 - 40 IU/L  25  24   ALT 0 - 32 IU/L  13  13     Lab Results  Component Value Date   WBC 4.8 07/28/2022   HGB 11.3 (L) 07/28/2022   HCT 37.2 07/28/2022    MCV 93.2 07/28/2022   PLT 356 07/28/2022   NEUTROABS 2.9 04/23/2022    ASSESSMENT & PLAN:  Malignant neoplasm of lower-outer quadrant of left breast of female, estrogen receptor positive ([place]) 09/01/2021:Left lumpectomy: Focal residual DCIS high-grade, no residual invasive ductal carcinoma, resection margins negative, 0/4 lymph nodes negative, tumor size: 0.1 cm, ER 95%, PR 0%, HER2 1+, Ki-67 50%   Treatment plan: 1.  Given the small size of the invasive ductal carcinoma there is no role of Oncotype DX testing. 2. adjuvant radiation therapy completed 11/17/21 3.  Adjuvant antiestrogens with Letrozole for 5 years starting 11/27/2021   Letrozole toxicities:   Breast cancer surveillance: 1.  Breast exam 10/01/2022: Benign 2. mammogram 04/10/2022: Benign breast density category B Brain MRI 07/28/2022: Periventricular FLAIR hyperintensity consistent with multiple sclerosis  unchanged since 2020  Return to clinic in 1 year for follow-up    No orders of the defined types were placed in this encounter.  The patient has a good understanding of the overall plan. she agrees with it. she will call with any problems that may develop before the next visit here. Total time spent: 30 mins including face to face time and time spent for planning, charting and co-ordination of care   Harriette Ohara, MD 10/01/22    I Gardiner Coins am scribing for Dr. Lindi Adie  I have reviewed the above documentation for accuracy and completeness, and I agree with the above.

## 2022-10-01 ENCOUNTER — Other Ambulatory Visit: Payer: Self-pay

## 2022-10-01 ENCOUNTER — Inpatient Hospital Stay: Payer: BC Managed Care – PPO | Attending: Hematology and Oncology | Admitting: Hematology and Oncology

## 2022-10-01 DIAGNOSIS — C50512 Malignant neoplasm of lower-outer quadrant of left female breast: Secondary | ICD-10-CM | POA: Diagnosis not present

## 2022-10-01 DIAGNOSIS — Z79811 Long term (current) use of aromatase inhibitors: Secondary | ICD-10-CM | POA: Diagnosis not present

## 2022-10-01 DIAGNOSIS — Z170 Estrogen receptor positive status [ER+]: Secondary | ICD-10-CM | POA: Diagnosis not present

## 2022-10-01 DIAGNOSIS — Z923 Personal history of irradiation: Secondary | ICD-10-CM | POA: Insufficient documentation

## 2022-10-01 MED ORDER — EXEMESTANE 25 MG PO TABS: 25.0000 mg | ORAL_TABLET | Freq: Every day | ORAL | 3 refills | Status: DC

## 2022-10-01 NOTE — Assessment & Plan Note (Addendum)
09/01/2021:Left lumpectomy: Focal residual DCIS high-grade, no residual invasive ductal carcinoma, resection margins negative, 0/4 lymph nodes negative, tumor size: 0.1 cm, ER 95%, PR 0%, HER2 1+, Ki-67 50%  Treatment plan: 1.Given the small size of the invasive ductal carcinoma there is no role of Oncotype DX testing. 2.adjuvant radiation therapy completed 11/17/21 3.Adjuvant antiestrogens with Letrozole for 5 years starting 11/27/2021  Letrozole toxicities:   Breast cancer surveillance: 1.  Breast exam 10/01/2022: Benign 2. mammogram 04/10/2022: Benign breast density category B Brain MRI 07/28/2022: Periventricular FLAIR hyperintensity consistent with multiple sclerosis unchanged since 2020  Return to clinic in 1 year for follow-up

## 2022-11-06 ENCOUNTER — Ambulatory Visit: Payer: BC Managed Care – PPO | Admitting: Adult Health

## 2022-11-06 ENCOUNTER — Encounter: Payer: Self-pay | Admitting: Adult Health

## 2022-11-06 VITALS — BP 142/87 | HR 86 | Ht 63.0 in | Wt 175.6 lb

## 2022-11-06 DIAGNOSIS — G35 Multiple sclerosis: Secondary | ICD-10-CM | POA: Diagnosis not present

## 2022-11-06 NOTE — Progress Notes (Signed)
PATIENT: Joyce Bennett DOB: 12/28/1955  REASON FOR VISIT: follow up HISTORY FROM: patient  Chief Complaint  Patient presents with   Follow-up   Room 19    Room 19. Pt is Alone. Pt is here for Follow Up. Pt states that everything has been going ok. Pt states no new symptoms.      HISTORY OF PRESENT ILLNESS: Today 11/06/22:  Joyce Bennett is a 66 year old female with a history of multiple sclerosis.  She returns today for follow-up.  Overall she feels that her symptoms have remained stable.  No changes in her gait or balance.  No changes with vision.  No changes with bowels or bladder.  No new numbness or weakness.  Remains on Betaseron.  Did repeat MRI of the brain in August with results following.  Also had blood work completed in August which I reviewed in epic  MRI BRAIN W/O contrast 07/28/2022: IMPRESSION: 1. No acute intracranial pathology. 2. Periventricular FLAIR hyperintensity consistent with multiple sclerosis. The burden of disease is overall not significantly changed since 2020, with no new or enhancing lesions to suggest active demyelination.  04/23/22: Joyce Bennett is a 66 year old female with a history of Multiple sclerosis. She returns today for follow-up. Reports that symptoms have been stable. Remains on Betaseron. Didn't repeat MRI as ordered at the last visit. Denies any changes with gait or balance. No changes with vision. No change with bowels or bladder. Returns today for follow-up.  10/27/21: Joyce Bennett is a 66 year old female with a history of multiple sclerosis.  She returns today for follow-up.  Overall she feels that she is doing well.  Denies any changes with her gait or balance.  Denies any new numbness or weakness.  No changes with her vision.  No changes with the bowels or bladder.  She continues on Betaseron.  She states that she is getting radiation for stage I breast cancer.   She returns today for an evaluation.  04/16/21: Joyce Bennett is a  66 year old female with a history of multiple sclerosis.  She returns today for follow-up.  Overall she feels that she has been doing well.  She denies any new symptoms.  No new numbness or tingling.  No changes with her gait or balance.  No changes with the bowels or bladder.  No visual changes.  She remains on Betaseron.  She returns today for an evaluation.  HISTORY 06/18/19:   Joyce Bennett is a 66 year old female with a history of multiple sclerosis.  She returns today for follow-up.  She remains on Betaseron.  She denies any new symptoms.  No changes with her gait or balance.  No changes with her bowels or bladder.  No new numbness or weakness.  No changes with her vision.  Her last MRI of the brain was in 2017.  She joins me today for follow-up.  REVIEW OF SYSTEMS: Out of a complete 14 system review of symptoms, the patient complains only of the following symptoms, and all other reviewed systems are negative.  See HPI  ALLERGIES: Allergies  Allergen Reactions   Keflex [Cephalexin]    Shellfish Allergy Swelling    HOME MEDICATIONS: Outpatient Medications Prior to Visit  Medication Sig Dispense Refill   BETASERON 0.3 MG KIT injection ADD 1.2 ML DILUENT TO VIAL AND MIX GENTLY. INJECT 1 ML SUBCUTANEOUSLY EVERY OTHER DAY. USE WITHIN 3 HOURS OF MIXING. STORE AT ROOM TEMPERATURE. 42 kit 5   CALCIUM PO Take by mouth daily.  exemestane (AROMASIN) 25 MG tablet Take 1 tablet (25 mg total) by mouth daily after breakfast. 90 tablet 3   fluticasone (FLONASE) 50 MCG/ACT nasal spray Place 1 spray into both nostrils 2 (two) times daily.     fluticasone (FLONASE) 50 MCG/ACT nasal spray Place 2 sprays into both nostrils daily. 16 mL 0   Multiple Vitamin (MULTIVITAMIN) tablet Take 1 tablet by mouth daily.     Multiple Vitamins-Minerals (ZINC PO) Take 1 tablet by mouth daily.     olopatadine (PATANOL) 0.1 % ophthalmic solution Place 1 drop into both eyes 2 (two) times daily.     trimethoprim  (TRIMPEX) 100 MG tablet Take 1 tablet (100 mg total) by mouth at bedtime as needed.     cetirizine (ZYRTEC ALLERGY) 10 MG tablet Take 1 tablet (10 mg total) by mouth daily for 10 days. 10 tablet 0   Facility-Administered Medications Prior to Visit  Medication Dose Route Frequency Provider Last Rate Last Admin   gadopentetate dimeglumine (MAGNEVIST) injection 15 mL  15 mL Intravenous Once PRN Kathrynn Ducking, MD        PAST MEDICAL HISTORY: Past Medical History:  Diagnosis Date   Breast cancer [facility])    Family history of breast cancer 08/10/2021   Family history of prostate cancer 08/10/2021   History of radiation therapy    left breast - 10/16/2021 - 11/17/2021 - Dr Gery Pray   Multiple sclerosis [facility])    Neurogenic bladder    Optic neuritis, left    Restless leg syndrome    Urinary tract infection    Recurrent    PAST SURGICAL HISTORY: Past Surgical History:  Procedure Laterality Date   BREAST LUMPECTOMY WITH RADIOACTIVE SEED AND SENTINEL LYMPH NODE BIOPSY Left 09/01/2021   Procedure: LEFT BREAST LUMPECTOMY WITH RADIOACTIVE SEED AND SENTINEL LYMPH NODE BIOPSY;  Surgeon: Jovita Kussmaul, MD;  Location: [place];  Service: General;  Laterality: Left;   FOOT SURGERY N/A    HAND SURGERY Right     FAMILY HISTORY: Family History  Problem Relation Age of Onset   Stomach cancer Sister        dx 46s   Breast cancer Maternal Aunt        dx after 74, x2 maternal aunts   Prostate cancer Maternal Uncle        dx 60s   Heart disease Maternal Grandmother    Breast cancer Paternal Grandmother        dx after 50   Multiple sclerosis Neg Hx     SOCIAL HISTORY: Social History   Socioeconomic History   Marital status: Married    Spouse name: Not on file   Number of children: 1   Years of education: ASSOCIATE   Highest education level: Not on file  Occupational History   Occupation: International aid/development worker: [employer]  Tobacco Use   Smoking  status: Never   Smokeless tobacco: Never  Substance and Sexual Activity   Alcohol use: Yes    Comment: rare   Drug use: No   Sexual activity: Not on file  Other Topics Concern   Not on file  Social History Narrative   Patient is married with 1 child.Patient is right handed.Patient has a Associate's degree.  4 cups a Week of Tea   Social Determinants of Health   Financial Resource Strain: Not on file  Food Insecurity: Not on file  Transportation Needs: Not on file  Physical Activity:  Not on file  Stress: Not on file  Social Connections: Not on file  Intimate Partner Violence: Not on file      PHYSICAL EXAM  Vitals:   11/06/22 1054  BP: (!) 142/87  Pulse: 86  Weight: 175 lb 9.6 oz (79.7 kg)  Height: _0  (1.6 m)    Body mass index is 31.11 kg/m.  Generalized: Well developed, in no acute distress   Neurological examination  Mentation: Alert oriented to time, place, history taking. Follows all commands speech and language fluent Cranial nerve II-XII: Pupils were equal round reactive to light. Extraocular movements were full, visual field were full on confrontational test. Facial sensation and strength were normal. Uvula tongue midline. Head turning and shoulder shrug  were normal and symmetric. Motor: The motor testing reveals 5 over 5 strength of all 4 extremities. Good symmetric motor tone is noted throughout.  Sensory: Sensory testing is intact to soft touch on all 4 extremities. No evidence of extinction is noted.  Coordination: Cerebellar testing reveals good finger-nose-finger and heel-to-shin bilaterally.  Gait and station: Gait is normal.    DIAGNOSTIC DATA (LABS, IMAGING, TESTING) - I reviewed patient records, labs, notes, testing and imaging myself where available.  Lab Results  Component Value Date   WBC 4.8 07/28/2022   HGB 11.3 (L) 07/28/2022   HCT 37.2 07/28/2022   MCV 93.2 07/28/2022   PLT 356 07/28/2022      Component Value Date/Time   NA 139  07/28/2022 0129   NA 142 04/23/2022 1105   K 4.2 07/28/2022 0129   CL 106 07/28/2022 0129   CO2 25 07/28/2022 0129   GLUCOSE 98 07/28/2022 0129   BUN 21 07/28/2022 0129   BUN 12 04/23/2022 1105   CREATININE 1.16 (H) 07/28/2022 0129   CREATININE 0.84 08/09/2021 1243   CALCIUM 9.2 07/28/2022 0129   PROT 7.8 04/23/2022 1105   ALBUMIN 4.8 04/23/2022 1105   AST 25 04/23/2022 1105   AST 24 08/09/2021 1243   ALT 13 04/23/2022 1105   ALT 13 08/09/2021 1243   ALKPHOS 150 (H) 04/23/2022 1105   BILITOT 0.3 04/23/2022 1105   BILITOT 0.3 08/09/2021 1243   GFRNONAA 52 (L) 07/28/2022 0129   GFRNONAA >60 08/09/2021 1243   GFRAA 96 06/22/2020 1319      ASSESSMENT AND PLAN 66 y.o. year old female  has a past medical history of Breast cancer ([place]), Family history of breast cancer (08/10/2021), Family history of prostate cancer (08/10/2021), History of radiation therapy, Multiple sclerosis ([place]), Neurogenic bladder, Optic neuritis, left, Restless leg syndrome, and Urinary tract infection. here with:  Multiple sclerosis  Stable Continue Betaseron  Reviewed blood work MRI results in epic Previous patient of Dr. Jannifer Franklin.  We will get her established with Dr. Krista Blue as her primary neurologist. Follow-up in 6 months or sooner if needed    Ward Givens, MSN, NP-C 11/06/2022, 11:04 AM [facility] [address] [place], [place] [address] [phone]

## 2022-11-06 NOTE — Patient Instructions (Signed)
Your Plan:  Continue betaseron Your next appointment we will get you established with a new neurologist Dr. Krista Blue.     Thank you for coming to see [place] at [facility]. I hope we have been able to provide you high quality care today.  You may receive a patient satisfaction survey over the next few weeks. We would appreciate your feedback and comments so that we may continue to improve ourselves and the health of our patients.

## 2022-11-07 NOTE — Progress Notes (Signed)
Chart reviewed, agree above plan ?

## 2022-12-31 ENCOUNTER — Telehealth: Payer: Self-pay | Admitting: *Deleted

## 2022-12-31 ENCOUNTER — Ambulatory Visit: Payer: BC Managed Care – PPO | Attending: General Surgery

## 2022-12-31 ENCOUNTER — Other Ambulatory Visit: Payer: Self-pay | Admitting: *Deleted

## 2022-12-31 VITALS — Wt 170.2 lb

## 2022-12-31 DIAGNOSIS — C50512 Malignant neoplasm of lower-outer quadrant of left female breast: Secondary | ICD-10-CM

## 2022-12-31 DIAGNOSIS — Z483 Aftercare following surgery for neoplasm: Secondary | ICD-10-CM | POA: Insufficient documentation

## 2022-12-31 NOTE — Therapy (Signed)
  OUTPATIENT PHYSICAL THERAPY SOZO SCREENING NOTE   Patient Name: Joyce Bennett MRN: [id number] DOB:1956-11-08, 67 y.o., female Today's Date: 12/31/2022  PCP: [name], MD REFERRING PROVIDER: Jovita Kussmaul, MD   PT End of Session - 12/31/22 1046     Visit Number 3   # unchanged due to screen only   PT Start Time 1044    PT Stop Time 1051    PT Time Calculation (min) 7 min    Activity Tolerance Patient tolerated treatment well    Behavior During Therapy [facility] for tasks assessed/performed             Past Medical History:  Diagnosis Date   Breast cancer ([place])    Family history of breast cancer 08/10/2021   Family history of prostate cancer 08/10/2021   History of radiation therapy    left breast - 10/16/2021 - 11/17/2021 - Dr Gery Pray   Multiple sclerosis [facility])    Neurogenic bladder    Optic neuritis, left    Restless leg syndrome    Urinary tract infection    Recurrent   Past Surgical History:  Procedure Laterality Date   BREAST LUMPECTOMY WITH RADIOACTIVE SEED AND SENTINEL LYMPH NODE BIOPSY Left 09/01/2021   Procedure: LEFT BREAST LUMPECTOMY WITH RADIOACTIVE SEED AND SENTINEL LYMPH NODE BIOPSY;  Surgeon: Jovita Kussmaul, MD;  Location: [place];  Service: General;  Laterality: Left;   FOOT SURGERY N/A    HAND SURGERY Right    Patient Active Problem List   Diagnosis Date Noted   Genetic testing 08/24/2021   Family history of breast cancer 08/10/2021   Family history of prostate cancer 08/10/2021   Malignant neoplasm of lower-outer quadrant of left breast of female, estrogen receptor positive ([place]) 08/03/2021   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 She underwent a left lumpectomy and sentinel node biopsy (4 negative nodes) on 09/01/2021. It is ER positive, PR negative, and HER2 negative with a Ki67 of 50%. She had right hand surgery 40 years ago with hardware placed but it has since been removed  PRECAUTIONS: left UE Lymphedema risk, None  SUBJECTIVE: Pt returns for her 3 month L-Dex screen.   PAIN:  Are you having pain? No  SOZO SCREENING: Patient was assessed today using the SOZO machine to determine the lymphedema index score. This was compared to her baseline score. It was determined that she is within the recommended range when compared to her baseline and no further action is needed at this time. She will continue SOZO screenings. These are done every 3 months for 2 years post operatively followed by every 6 months for 2 years, and then annually.   L-DEX FLOWSHEETS - 12/31/22 1000       L-DEX LYMPHEDEMA SCREENING   Measurement Type Unilateral    L-DEX MEASUREMENT EXTREMITY Upper Extremity    POSITION  Standing    DOMINANT SIDE Right    At Risk Side Left    BASELINE SCORE (UNILATERAL) 4.5    L-DEX SCORE (UNILATERAL) 3.9    VALUE CHANGE (UNILAT) -0.6              Otelia Limes, PTA 12/31/2022, 10:49 AM

## 2022-12-31 NOTE — Telephone Encounter (Signed)
Patient called - she found new lump on side of left breast. First felt it last week.  No redness or pain at site and no fever. She states it "feels thick" to her.  Dr. Lindi Adie informed. Per Dr. Lindi Adie verbal orders, entered orders for Diagnostic Mammogram of left side and [place] left breast w/biopsy.  Patient informed of orders - she asked to be scheduled as soon as possible. [place] to schedule per patient request. [place] scheduled patient for MM and [place]: Tuesday, January 30 at 10:50, arrive at 10:30. Need for biopsy will be determined following imaging.  Patient informed and verbalized understanding and accepted appt times. She asked to cancel appt with NP for this week since scan results won't be back. Wed appt cancelled.

## 2023-01-02 ENCOUNTER — Ambulatory Visit: Payer: BC Managed Care – PPO | Admitting: Adult Health

## 2023-01-08 ENCOUNTER — Ambulatory Visit
Admission: RE | Admit: 2023-01-08 | Discharge: 2023-01-08 | Disposition: A | Discharge status: Home / Self Care | Payer: BC Managed Care – PPO | Source: Ambulatory Visit | Attending: Hematology and Oncology | Admitting: Hematology and Oncology

## 2023-01-08 DIAGNOSIS — Z170 Estrogen receptor positive status [ER+]: Secondary | ICD-10-CM

## 2023-01-09 ENCOUNTER — Other Ambulatory Visit: Payer: Self-pay | Admitting: Hematology and Oncology

## 2023-01-09 DIAGNOSIS — C50512 Malignant neoplasm of lower-outer quadrant of left female breast: Secondary | ICD-10-CM

## 2023-01-14 ENCOUNTER — Encounter: Payer: Self-pay | Admitting: *Deleted

## 2023-01-14 NOTE — Telephone Encounter (Signed)
Spoke with patient about Extavia Pt states currently on Betaseron and will discuss New medication Extavia with Dr Krista Blue in June. Pt thanked me for calling

## 2023-02-06 ENCOUNTER — Telehealth: Payer: Self-pay | Admitting: Neurology

## 2023-02-06 NOTE — Telephone Encounter (Signed)
LVM and sent mychart msg informing pt of r/s needed for 05/30/23 appt- MD out.

## 2023-02-25 ENCOUNTER — Ambulatory Visit
Admission: RE | Admit: 2023-02-25 | Discharge: 2023-02-25 | Disposition: A | Discharge status: Home / Self Care | Payer: BC Managed Care – PPO | Source: Ambulatory Visit | Attending: Family Medicine | Admitting: Family Medicine

## 2023-02-25 DIAGNOSIS — E2839 Other primary ovarian failure: Secondary | ICD-10-CM

## 2023-03-20 IMAGING — MG DIGITAL DIAGNOSTIC BILAT W/ TOMO W/ CAD
6 of 10 series · 6 of 26 positions shown · non-contrast
Comparison: Priors

CLINICAL DATA: Patient with history of left breast lumpectomy

EXAM:
DIGITAL DIAGNOSTIC BILATERAL MAMMOGRAM WITH TOMOSYNTHESIS AND CAD
TECHNIQUE: Bilateral digital diagnostic mammography and breast tomosynthesis
was performed. The images were evaluated with computer-aided
detection.

[L CC (1 of 2)]
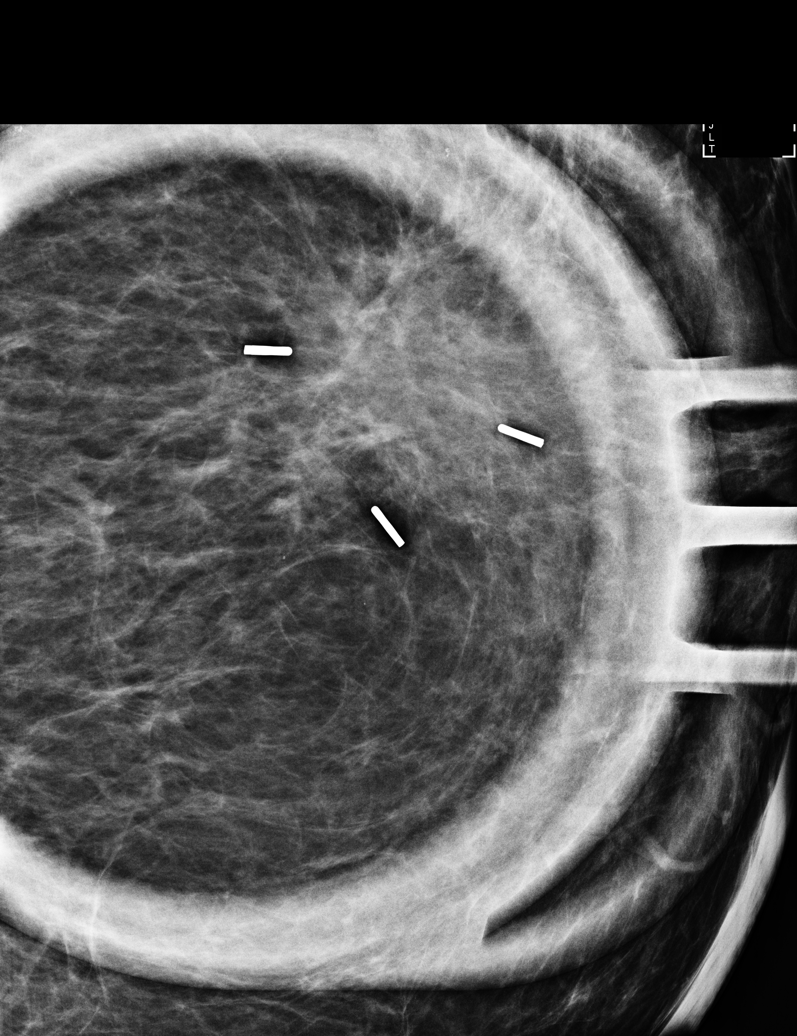

[L CC (2 of 2)]
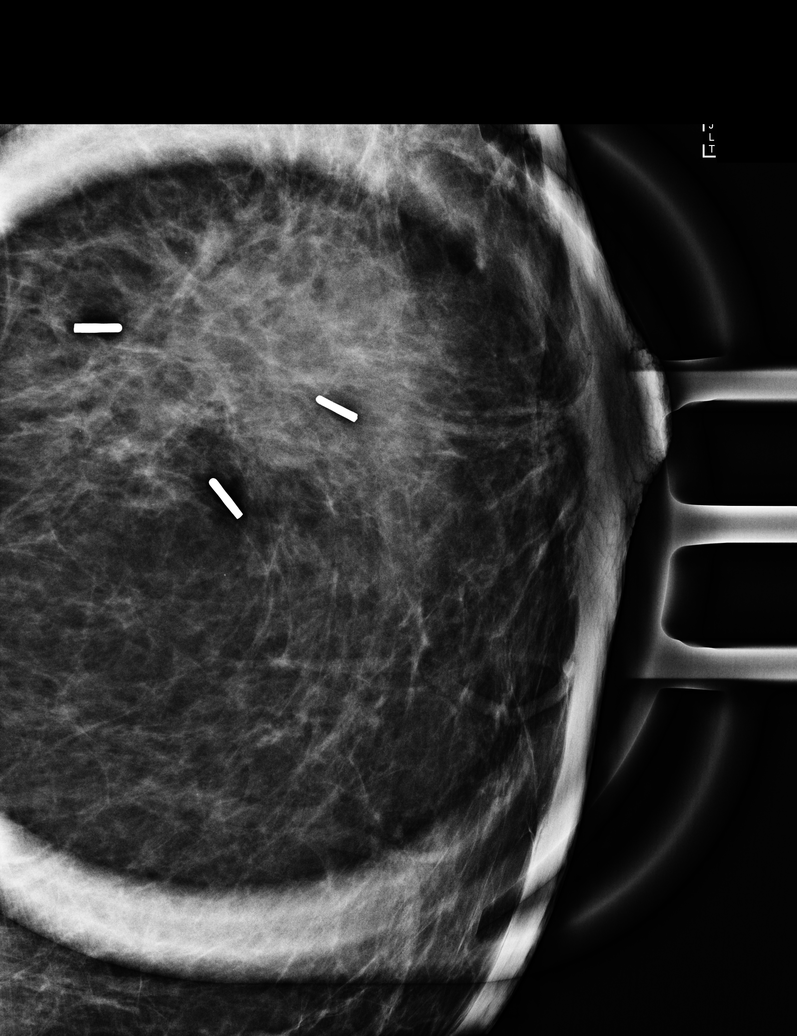

[R CC synth-2D]
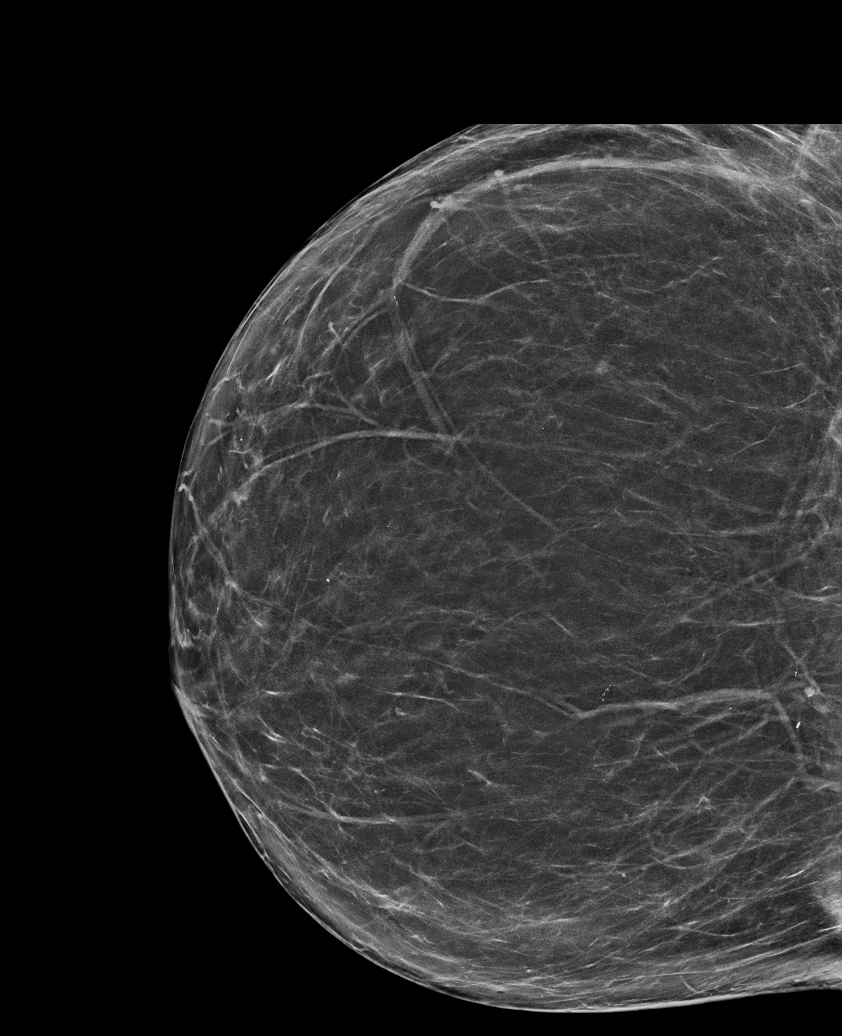

[L MLO synth-2D]
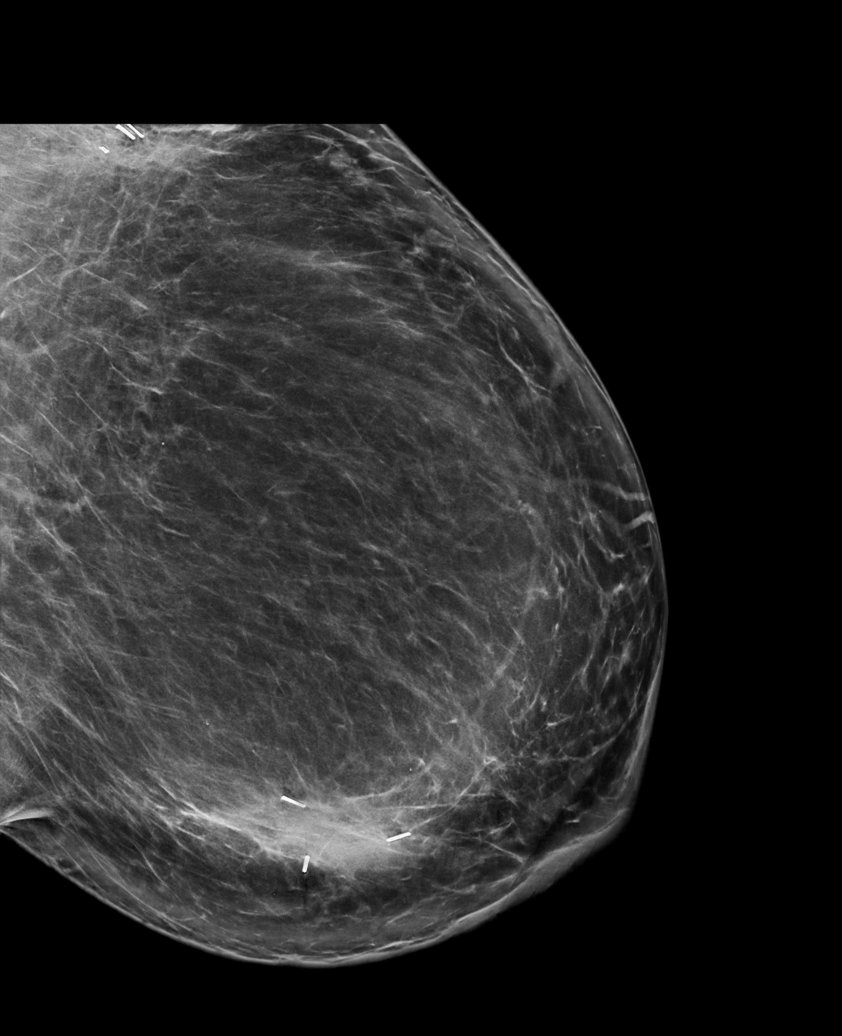

[R MLO synth-2D]
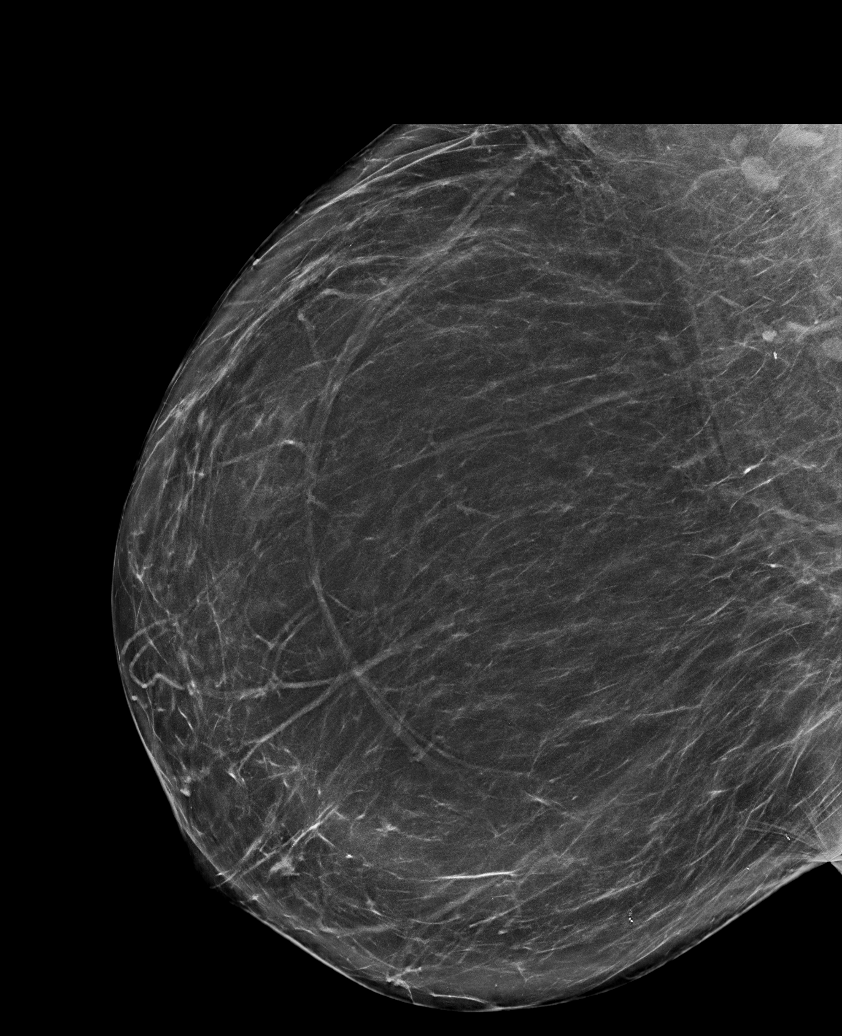

[L CC synth-2D]
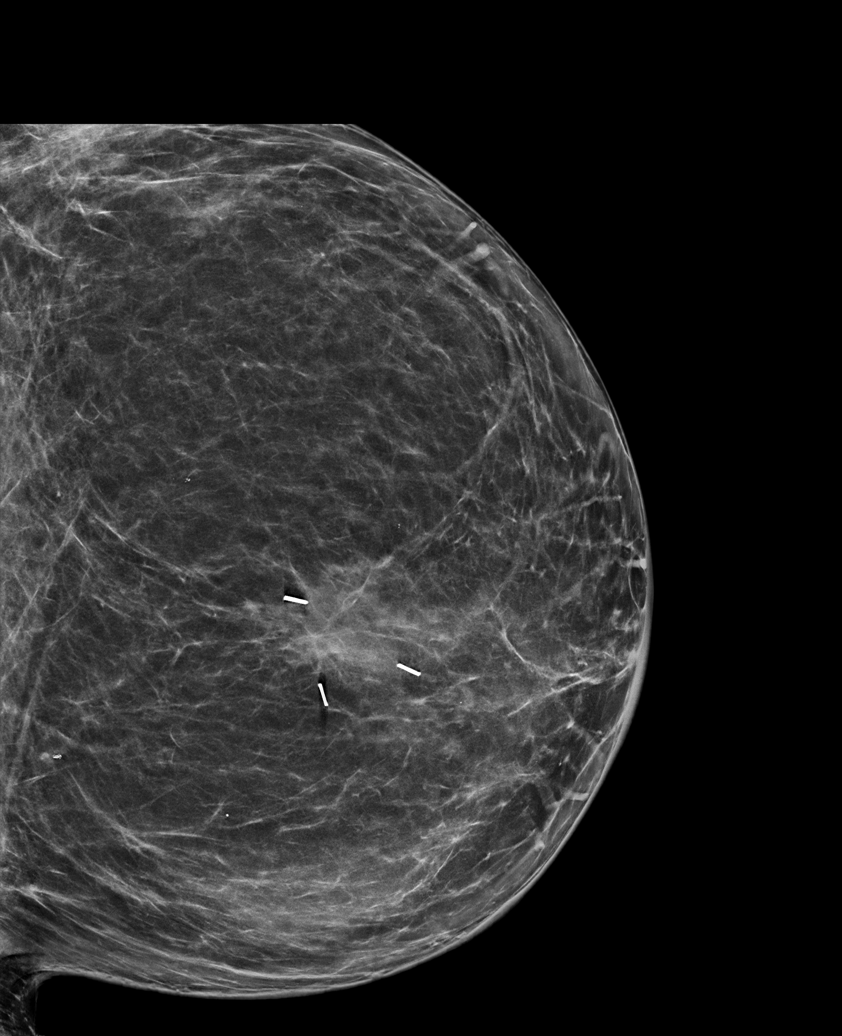

[6 of 26 positions shown; findings below may reference images not displayed]

ACR Breast Density Category b: There are scattered areas of
fibroglandular density.
FINDINGS: Interval postlumpectomy changes left breast. No new masses,
calcifications or nonsurgical distortion identified.
IMPRESSION: No mammographic evidence for malignancy. Interval left lumpectomy
changes.

RECOMMENDATION:
Bilateral diagnostic mammography 1 year.

I have discussed the findings and recommendations with the patient.
If applicable, a reminder letter will be sent to the patient
regarding the next appointment.

BI-RADS CATEGORY  2: Benign.

## 2023-03-28 ENCOUNTER — Other Ambulatory Visit: Payer: Self-pay | Admitting: Adult Health

## 2023-04-20 ENCOUNTER — Telehealth: Payer: Self-pay | Admitting: Neurology

## 2023-04-20 NOTE — Telephone Encounter (Signed)
I was paged this weekend by Dahlia Client at [facility] stating that Joyce Bennett is a patient of Butch Penny and needs a new prior authorization Betaseron.  Fax [phone].  I let them know it will have to wait until the office reopens on Monday.  They also left a callback number at [phone].   I am not sure which pod this should go to, our pod or Dr. Zannie Cove pod since she appears to be the new primary doctor? Please address next week. .thanks

## 2023-04-22 ENCOUNTER — Ambulatory Visit: Payer: BC Managed Care – PPO | Attending: General Surgery

## 2023-04-22 ENCOUNTER — Telehealth: Payer: Self-pay

## 2023-04-22 ENCOUNTER — Other Ambulatory Visit (HOSPITAL_COMMUNITY): Payer: Self-pay

## 2023-04-22 VITALS — Wt 171.5 lb

## 2023-04-22 DIAGNOSIS — Z483 Aftercare following surgery for neoplasm: Secondary | ICD-10-CM | POA: Insufficient documentation

## 2023-04-22 NOTE — Telephone Encounter (Signed)
Pharmacy Patient Advocate Encounter   Received notification from [facility] that prior authorization for Betaseron 0.3MG  kit is required/requested.   PA submitted on 04/22/2023 to (ins) [employer] via [employer] or [facility]) confirmation # [id number] Status is pending

## 2023-04-22 NOTE — Therapy (Signed)
OUTPATIENT PHYSICAL THERAPY SOZO SCREENING NOTE   Patient Name: Joyce Bennett MRN: [id number] DOB:1956/11/21, 67 y.o., female Today's Date: 04/22/2023  PCP: Stevphen Rochester, MD REFERRING PROVIDER: Griselda Miner, MD   PT End of Session - 04/22/23 1026     Visit Number 3   #  unchanged due to screen only   PT Start Time 1025    PT Stop Time 1029    PT Time Calculation (min) 4 min    Activity Tolerance Patient tolerated treatment well    Behavior During Therapy [facility] for tasks assessed/performed             Past Medical History:  Diagnosis Date   Breast cancer (HCC)    Family history of breast cancer 08/10/2021   Family history of prostate cancer 08/10/2021   History of radiation therapy    left breast - 10/16/2021 - 11/17/2021 - Dr Antony Blackbird   Multiple sclerosis [facility])    Neurogenic bladder    Optic neuritis, left    Restless leg syndrome    Urinary tract infection    Recurrent   Past Surgical History:  Procedure Laterality Date   BREAST LUMPECTOMY WITH RADIOACTIVE SEED AND SENTINEL LYMPH NODE BIOPSY Left 09/01/2021   Procedure: LEFT BREAST LUMPECTOMY WITH RADIOACTIVE SEED AND SENTINEL LYMPH NODE BIOPSY;  Surgeon: Griselda Miner, MD;  Location: [facility];  Service: General;  Laterality: Left;   FOOT SURGERY N/A    HAND SURGERY Right    Patient Active Problem List   Diagnosis Date Noted   Genetic testing 08/24/2021   Family history of breast cancer 08/10/2021   Family history of prostate cancer 08/10/2021   Malignant neoplasm of lower-outer quadrant of left breast of female, estrogen receptor positive (HCC) 08/03/2021   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 She underwent a left lumpectomy and sentinel node biopsy (4 negative nodes) on 09/01/2021. It is ER positive, PR negative, and HER2 negative with a Ki67 of 50%. She had right hand surgery 40 years ago with hardware placed but it has since been removed  PRECAUTIONS: left UE Lymphedema risk, None  SUBJECTIVE: Pt returns for her last 3 month L-Dex screen.   PAIN:  Are you having pain? No  SOZO SCREENING: Patient was assessed today using the SOZO machine to determine the lymphedema index score. This was compared to her baseline score. It was determined that she is within the recommended range when compared to her baseline and no further action is needed at this time. She will continue SOZO screenings. These are done every 3 months for 2 years post operatively followed by every 6 months for 2 years, and then annually.   L-DEX FLOWSHEETS - 04/22/23 1000       L-DEX LYMPHEDEMA SCREENING   Measurement Type Unilateral    L-DEX MEASUREMENT EXTREMITY Upper Extremity    POSITION  Standing    DOMINANT SIDE Right    At Risk Side Left    BASELINE SCORE (UNILATERAL) 4.5    L-DEX SCORE (UNILATERAL) 0.4    VALUE CHANGE (UNILAT) -4.1            P: Begin 6 month L-Dex screens after next screen.   Berna Spare  Ann, PTA 04/22/2023, 10:29 AM

## 2023-04-22 NOTE — Telephone Encounter (Signed)
A new telephone call encounter has been made for this PA Request-please see telephone note dated ...04/22/2023 

## 2023-04-22 NOTE — Telephone Encounter (Signed)
PA needed for Betaseron

## 2023-04-23 ENCOUNTER — Other Ambulatory Visit (HOSPITAL_COMMUNITY): Payer: Self-pay

## 2023-04-23 NOTE — Telephone Encounter (Signed)
    I could not find documentation specifying-Please advise-

## 2023-04-23 NOTE — Telephone Encounter (Signed)
Pharmacy Patient Advocate Encounter  Prior Authorization for Betaseron 0.3MG  kit has been approved by [employer] (ins).    PA # PA Case ID: [id number] Effective dates: 04/23/2023 through 04/22/2024

## 2023-04-23 NOTE — Telephone Encounter (Signed)
Pharmacy Patient Advocate Encounter   Received notification from [facility] that prior authorization for Betaseron 0.3MG  kit is required/requested.   PA submitted on 04/23/2023 to (ins) [employer] via [employer] or [facility]) confirmation # [id number] Status is pending

## 2023-04-23 NOTE — Telephone Encounter (Signed)
Per Aundra Millet NP, it's relapsing-remitting MS

## 2023-05-10 ENCOUNTER — Ambulatory Visit
Admission: RE | Admit: 2023-05-10 | Discharge: 2023-05-10 | Disposition: A | Discharge status: Home / Self Care | Payer: BC Managed Care – PPO | Source: Ambulatory Visit | Attending: Hematology and Oncology | Admitting: Hematology and Oncology

## 2023-05-10 DIAGNOSIS — C50512 Malignant neoplasm of lower-outer quadrant of left female breast: Secondary | ICD-10-CM

## 2023-05-10 HISTORY — DX: Personal history of irradiation: Z92.3

## 2023-05-30 ENCOUNTER — Ambulatory Visit: Payer: Medicare Other | Admitting: Neurology

## 2023-06-24 ENCOUNTER — Ambulatory Visit: Payer: Medicare Other | Admitting: Neurology

## 2023-07-02 ENCOUNTER — Other Ambulatory Visit: Payer: Self-pay | Admitting: Adult Health

## 2023-07-15 ENCOUNTER — Telehealth: Payer: Self-pay | Admitting: Adult Health

## 2023-07-15 MED ORDER — BETASERON 0.3 MG ~~LOC~~ KIT: PACK | SUBCUTANEOUS | 0 refills | Status: DC

## 2023-07-15 NOTE — Telephone Encounter (Signed)
Looked up fax number. It is for [facility] with their Betaplus Program. Prescription has been printed and is pending MM NP's signature.

## 2023-07-15 NOTE — Telephone Encounter (Signed)
Pt called stating that now that she is retired her Ins will not cover her Interferon Beta-1b (BETASERON) 0.3 MG KIT injection  She states that the assistance program is needing a Rx from provider in order for them to send her a 3 month supply of this medication until she gets everything situated with her insurance company. She states that the Rx can be faxed to [phone] Please advise.

## 2023-07-15 NOTE — Telephone Encounter (Signed)
Betaseron Rx signed by MM NP then faxed to [facility] betaplus program. Received a receipt of confirmation.

## 2023-07-23 NOTE — Telephone Encounter (Signed)
Received [employer] patient assistance form for Betaseron. Form completed and placed on NP desk for review and signature.

## 2023-07-24 NOTE — Telephone Encounter (Addendum)
Form signed, faxed back to [phone]. Confirmation received.  Form placed in MR for pick up.

## 2023-08-05 ENCOUNTER — Ambulatory Visit: Payer: Medicare HMO | Attending: General Surgery

## 2023-08-05 VITALS — Wt 170.2 lb

## 2023-08-05 DIAGNOSIS — Z483 Aftercare following surgery for neoplasm: Secondary | ICD-10-CM | POA: Insufficient documentation

## 2023-08-05 NOTE — Therapy (Signed)
OUTPATIENT PHYSICAL THERAPY SOZO SCREENING NOTE   Patient Name: Joyce Bennett MRN: [id number] DOB:[birth year], 67 y.o., female Today's Date: 08/05/2023  PCP: Stevphen Rochester, MD REFERRING PROVIDER: Griselda Miner, MD   PT End of Session - 08/05/23 1032     Visit Number 3   # unchanged due to screen only   PT Start Time 1030    PT Stop Time 1034    PT Time Calculation (min) 4 min    Activity Tolerance Patient tolerated treatment well    Behavior During Therapy [facility] for tasks assessed/performed             Past Medical History:  Diagnosis Date   Breast cancer (HCC)    Family history of breast cancer 08/10/2021   Family history of prostate cancer 08/10/2021   History of radiation therapy    left breast - 10/16/2021 - 11/17/2021 - Dr Antony Blackbird   Multiple sclerosis [facility])    Neurogenic bladder    Optic neuritis, left    Personal history of radiation therapy    Restless leg syndrome    Urinary tract infection    Recurrent   Past Surgical History:  Procedure Laterality Date   BREAST LUMPECTOMY Left    08-2021   BREAST LUMPECTOMY WITH RADIOACTIVE SEED AND SENTINEL LYMPH NODE BIOPSY Left 09/01/2021   Procedure: LEFT BREAST LUMPECTOMY WITH RADIOACTIVE SEED AND SENTINEL LYMPH NODE BIOPSY;  Surgeon: Griselda Miner, MD;  Location: [facility];  Service: General;  Laterality: Left;   FOOT SURGERY N/A    HAND SURGERY Right    Patient Active Problem List   Diagnosis Date Noted   Genetic testing 08/24/2021   Family history of breast cancer 08/10/2021   Family history of prostate cancer 08/10/2021   Malignant neoplasm of lower-outer quadrant of left breast of female, estrogen receptor positive (HCC) 08/03/2021   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 She underwent a left lumpectomy and sentinel node biopsy (4 negative nodes) on 09/01/2021. It is ER positive, PR negative, and HER2 negative with a Ki67 of 50%. She had right hand surgery 40 years ago with hardware placed but it has since been removed  PRECAUTIONS: left UE Lymphedema risk, None  SUBJECTIVE: Pt returns for her last 3 month L-Dex screen.   PAIN:  Are you having pain? No  SOZO SCREENING: Patient was assessed today using the SOZO machine to determine the lymphedema index score. This was compared to her baseline score. It was determined that she is within the recommended range when compared to her baseline and no further action is needed at this time. She will continue SOZO screenings. These are done every 3 months for 2 years post operatively followed by every 6 months for 2 years, and then annually.   L-DEX FLOWSHEETS - 08/05/23 1000       L-DEX LYMPHEDEMA SCREENING   Measurement Type Unilateral    L-DEX MEASUREMENT EXTREMITY Upper Extremity    POSITION  Standing    DOMINANT SIDE Right    At Risk Side Left    BASELINE SCORE (UNILATERAL) 4.5    L-DEX SCORE (UNILATERAL) 5.1    VALUE CHANGE (UNILAT) 0.6  P: Begin 6 month L-Dex screens next.   Hermenia Bers, PTA 08/05/2023, 10:33 AM

## 2023-08-22 ENCOUNTER — Telehealth: Payer: Self-pay | Admitting: Neurology

## 2023-08-22 ENCOUNTER — Ambulatory Visit: Payer: Medicare HMO | Admitting: Neurology

## 2023-08-22 ENCOUNTER — Encounter: Payer: Self-pay | Admitting: Neurology

## 2023-08-22 VITALS — BP 118/81 | HR 73 | Resp 14 | Ht 62.25 in | Wt 174.0 lb

## 2023-08-22 DIAGNOSIS — G35 Multiple sclerosis: Secondary | ICD-10-CM | POA: Diagnosis not present

## 2023-08-22 NOTE — Telephone Encounter (Signed)
Call to patient, she states due to side effects of other medications she wishes to stay on the interferon Beta-1b injection. Advised I would let Dr. Terrace Arabia know. Patient appreciative.

## 2023-08-22 NOTE — Telephone Encounter (Signed)
Pt has decided that she wants to just stay on Interferon Beta-1b (BETASERON) 0.3 MG KIT injection , she does not want to switch to any pill form medication. Pt is asking for a call from RN.

## 2023-08-22 NOTE — Progress Notes (Signed)
Chief Complaint  Patient presents with   Multiple Sclerosis    Rm14, alone Ms:pt stated that she is doing well, she would like to get off beta steron if possible      ASSESSMENT AND PLAN  Joyce Bennett is a 67 y.o. female   Relapsing remitting multiple sclerosis,  Overall has been able for many years, clinical wise and imaging wise, but MRI of the brain to have moderate lesion load, history also suggestive of spinal cord involvement,(did not find spinal cord imagings in epic)  Discussed with patient, decided to came off Betaseron, switch to aubagio 14 mg daily,  If she continue to do well with aubagio 14 mg daily for 1 to 2 years, may taper down to 7 mg daily,  Laboratory for baseline  Return To Clinic With NP In 6 Months     DIAGNOSTIC DATA (LABS, IMAGING, TESTING) - I reviewed patient records, labs, notes, testing and imaging myself where available.   MEDICAL HISTORY:  Joyce Bennett is a 67 year old female, follow-up for relapsing remitting multiple sclerosis, she was patient of Dr. Anne Hahn in the past, followed by nurse practitioner Aundra Millet for many years, her primary care physician is from [place], Joyce Bennett   I reviewed and summarized the referring note. PMHX Left breast cancer, s/p lobectomy, radiation therapy. RRMS  She just retired from driving schoolbus for 17 years, functioning very well, no gait abnormality,  Relapsing remitting multiple sclerosis was diagnosed in her early 65s, presenting with 1 vision loss, recovered with IV steroid, diagnosis was confirmed by abnormal MRI of the brain, personally reviewed most recent MRI of the brain with and without contrast from August 2023, compared to multiple previous scans, periventricular T2/FLAIR hyperdensity, oval-shaped, consistent with her diagnosis of multiple sclerosis, lesion burden overall has no significant change  At the beginning of the disease, she has some lower extremity paresthesia, but never  had gait abnormality, had a frequent urinary tract infection, due to bladder spasticity, receiving intermittent Trimpex treatment from urology,  She denies other significant relapsing remitting episodes, no MRI spine information found in system  She was started on Betaseron around 2000,  Because she has been doing so well, so stable, tired of needle injection, subcutaneous lipoatrophy, wants to consider stop the medications,  PHYSICAL EXAM:   Vitals:   08/22/23 1302  BP: 118/81  Pulse: 73  Resp: 14  Weight: 174 lb (78.9 kg)  Height: 5' 2.25" (1.581 m)   Body mass index is 31.57 kg/m.  PHYSICAL EXAMNIATION:  Gen: NAD, conversant, well nourised, well groomed                     Cardiovascular: Regular rate rhythm, no peripheral edema, warm, nontender. Eyes: Conjunctivae clear without exudates or hemorrhage Neck: Supple, no carotid bruits. Pulmonary: Clear to auscultation bilaterally   NEUROLOGICAL EXAM:  MENTAL STATUS: Speech/cognition: Awake, alert, oriented to history taking and casual conversation CRANIAL NERVES: CN II: Visual fields are full to confrontation. Pupils are round equal and briskly reactive to light. CN III, IV, VI: extraocular movement are normal. No ptosis. CN V: Facial sensation is intact to light touch CN VII: Face is symmetric with normal eye closure  CN VIII: Hearing is normal to causal conversation. CN IX, X: Phonation is normal. CN XI: Head turning and shoulder shrug are intact  MOTOR: There is no pronator drift of out-stretched arms. Muscle bulk and tone are normal. Muscle strength is normal.  REFLEXES: Reflexes  are 2+ and symmetric at the biceps, triceps, knees, and ankles. Plantar responses are flexor.  SENSORY: Intact to light touch, pinprick and vibratory sensation are intact in fingers and toes.  COORDINATION: There is no trunk or limb dysmetria noted.  GAIT/STANCE: Posture is normal. Gait is steady with normal steps, base, arm swing,  and turning. Heel and toe walking are normal. Tandem gait is normal.  Romberg is absent.  REVIEW OF SYSTEMS:  Full 14 system review of systems performed and notable only for as above All other review of systems were negative.   ALLERGIES: Allergies  Allergen Reactions   Keflex [Cephalexin]    Shellfish Allergy Swelling    Pt. Still consumes shellfish and products containing shelfish    HOME MEDICATIONS: Current Outpatient Medications  Medication Sig Dispense Refill   CALCIUM PO Take by mouth daily.     cetirizine (ZYRTEC ALLERGY) 10 MG tablet Take 1 tablet (10 mg total) by mouth daily for 10 days. 10 tablet 0   exemestane (AROMASIN) 25 MG tablet Take 1 tablet (25 mg total) by mouth daily after breakfast. 90 tablet 3   fluticasone (FLONASE) 50 MCG/ACT nasal spray Place 1 spray into both nostrils 2 (two) times daily.     fluticasone (FLONASE) 50 MCG/ACT nasal spray Place 2 sprays into both nostrils daily. 16 mL 0   Interferon Beta-1b (BETASERON) 0.3 MG KIT injection ADD 1.2 ML DILUENT TO VIAL AND MIX GENTLY. INJECT 1 ML UNDER THE SKIN EVERY OTHER DAY 42 kit 0   Multiple Vitamin (MULTIVITAMIN) tablet Take 1 tablet by mouth daily.     Multiple Vitamins-Minerals (ZINC PO) Take 1 tablet by mouth daily.     olopatadine (PATANOL) 0.1 % ophthalmic solution Place 1 drop into both eyes 2 (two) times daily.     trimethoprim (TRIMPEX) 100 MG tablet Take 1 tablet (100 mg total) by mouth at bedtime as needed.     No current facility-administered medications for this visit.   Facility-Administered Medications Ordered in Other Visits  Medication Dose Route Frequency Provider Last Rate Last Admin   gadopentetate dimeglumine (MAGNEVIST) injection 15 mL  15 mL Intravenous Once PRN York Spaniel, MD        PAST MEDICAL HISTORY: Past Medical History:  Diagnosis Date   Breast cancer [facility])    Family history of breast cancer 08/10/2021   Family history of prostate cancer 08/10/2021   History of  radiation therapy    left breast - 10/16/2021 - 11/17/2021 - Dr Antony Blackbird   Multiple sclerosis [facility])    Neurogenic bladder    Optic neuritis, left    Personal history of radiation therapy    Restless leg syndrome    Urinary tract infection    Recurrent    PAST SURGICAL HISTORY: Past Surgical History:  Procedure Laterality Date   BREAST LUMPECTOMY Left    08-2021   BREAST LUMPECTOMY WITH RADIOACTIVE SEED AND SENTINEL LYMPH NODE BIOPSY Left 09/01/2021   Procedure: LEFT BREAST LUMPECTOMY WITH RADIOACTIVE SEED AND SENTINEL LYMPH NODE BIOPSY;  Surgeon: Griselda Miner, MD;  Location: [facility];  Service: General;  Laterality: Left;   FOOT SURGERY N/A    HAND SURGERY Right     FAMILY HISTORY: Family History  Problem Relation Age of Onset   Stomach cancer Sister        dx 16s   Breast cancer Maternal Aunt        dx after 26, x2 maternal aunts  Prostate cancer Maternal Uncle        dx 60s   Heart disease Maternal Grandmother    Breast cancer Paternal Grandmother        dx after 64   Multiple sclerosis Neg Hx     SOCIAL HISTORY: Social History   Socioeconomic History   Marital status: Married    Spouse name: Not on file   Number of children: 1   Years of education: ASSOCIATE   Highest education level: Not on file  Occupational History   Occupation: Facilities manager: [employer]  Tobacco Use   Smoking status: Never   Smokeless tobacco: Never  Substance and Sexual Activity   Alcohol use: Yes    Comment: rare   Drug use: No   Sexual activity: Not on file  Other Topics Concern   Not on file  Social History Narrative   Patient is married with 1 child.Patient is right handed.Patient has a Associate's degree.  4 cups a Week of Tea   Social Determinants of Health   Financial Resource Strain: Low Risk  (06/27/2023)   Received from [facility]   Overall Financial Resource Strain (CARDIA)    Difficulty of Paying Living Expenses: Not  hard at all  Food Insecurity: No Food Insecurity (06/27/2023)   Received from [facility]   Hunger Vital Sign    Worried About Running Out of Food in the Last Year: Never true    Ran Out of Food in the Last Year: Never true  Transportation Needs: No Transportation Needs (06/27/2023)   Received from [facility] - Transportation    Lack of Transportation (Medical): No    Lack of Transportation (Non-Medical): No  Physical Activity: Unknown (09/10/2022)   Received from [facility]   Exercise Vital Sign    Days of Exercise per Week: 0 days    Minutes of Exercise per Session: Not on file  Recent Concern: Physical Activity - Inactive (09/10/2022)   Received from [facility]   Exercise Vital Sign    Days of Exercise per Week: 0 days    Minutes of Exercise per Session: 20 min  Stress: No Stress Concern Present (09/10/2022)   Received from [facility], [facility] of Occupational Health - Occupational Stress Questionnaire    Feeling of Stress : Not at all  Social Connections: Socially Integrated (09/10/2022)   Received from [facility]   Social Network    How would you rate your social network (family, work, friends)?: Good participation with social networks  Intimate Partner Violence: Not At Risk (09/10/2022)   Received from [facility]   HITS    Over the last 12 months how often did your partner physically hurt you?: 1    Over the last 12 months how often did your partner insult you or talk down to you?: 1    Over the last 12 months how often did your partner threaten you with physical harm?: 1    Over the last 12 months how often did your partner scream or curse at you?: 1      Levert Feinstein, M.D. Ph.D.  [facility] [address] [place], [place] [address] Ph: [phone] Fax: [phone]  CC:  Stevphen Rochester, MD [address] [place],  [place]  [address]  Stevphen Rochester, MD

## 2023-08-26 MED ORDER — BETASERON 0.3 MG ~~LOC~~ KIT: PACK | SUBCUTANEOUS | 3 refills | Status: DC

## 2023-08-26 NOTE — Telephone Encounter (Signed)
Call to patient, she wanted to make sure of what she signed last week and I advised it was the [place] start form but it was shredded due to her wanting to stay on the interferon injection because of less side effects. She asked if I would remind Dr. Terrace Arabia so she would make sure she has refills. Advised I would. Patient appreciative of call.

## 2023-08-26 NOTE — Telephone Encounter (Signed)
Meds ordered this encounter  Medications   Interferon Beta-1b (BETASERON) 0.3 MG KIT injection    Sig: ADD 1.2 ML DILUENT TO VIAL AND MIX GENTLY. INJECT 1 ML UNDER THE SKIN EVERY OTHER DAY    Dispense:  42 kit    Refill:  3

## 2023-08-26 NOTE — Addendum Note (Signed)
Addended by: Levert Feinstein on: 08/26/2023 02:05 PM   Modules accepted: Orders

## 2023-08-26 NOTE — Telephone Encounter (Addendum)
Pt would like a call back to discuss the following concerns: Pt wants to know what she signed at last appt on 08/22/23, Pt said she do not know what she signed. Want to make sure there will refill Interferon when time for a refill.

## 2023-08-27 ENCOUNTER — Other Ambulatory Visit (INDEPENDENT_AMBULATORY_CARE_PROVIDER_SITE_OTHER): Payer: Self-pay

## 2023-08-27 DIAGNOSIS — Z0289 Encounter for other administrative examinations: Secondary | ICD-10-CM

## 2023-08-28 ENCOUNTER — Telehealth: Payer: Self-pay | Admitting: Neurology

## 2023-08-28 NOTE — Telephone Encounter (Signed)
Mychart message to pt.

## 2023-08-28 NOTE — Telephone Encounter (Signed)
Call to patient, she has concerned about her labs that were recently done. I explained to patient  they have not been resulted by Dr. Terrace Arabia yet even thought hey are visible to her.She has questions about her alkaline, creatinine, and EGFR and what would cause them to outside of the normal range. I advised I will send to Dr. Terrace Arabia for review and recommendations/advice. Patient appreciative of call.

## 2023-08-28 NOTE — Telephone Encounter (Signed)
Pt received her lab results and would like to discuss some things she has noticed are high. Please advise.

## 2023-08-30 LAB — COMPREHENSIVE METABOLIC PANEL
ALT: 14 IU/L (ref 0–32)
AST: 23 IU/L (ref 0–40)
Albumin: 4.3 g/dL (ref 3.9–4.9)
Alkaline Phosphatase: 183 IU/L — ABNORMAL HIGH (ref 44–121)
BUN/Creatinine Ratio: 13 (ref 12–28)
BUN: 15 mg/dL (ref 8–27)
Bilirubin Total: 0.3 mg/dL (ref 0.0–1.2)
CO2: 26 mmol/L (ref 20–29)
Calcium: 9.7 mg/dL (ref 8.7–10.3)
Chloride: 104 mmol/L (ref 96–106)
Creatinine, Ser: 1.13 mg/dL — ABNORMAL HIGH (ref 0.57–1.00)
Globulin, Total: 3 g/dL (ref 1.5–4.5)
Glucose: 86 mg/dL (ref 70–99)
Potassium: 4.4 mmol/L (ref 3.5–5.2)
Sodium: 144 mmol/L (ref 134–144)
Total Protein: 7.3 g/dL (ref 6.0–8.5)
eGFR: 54 mL/min/{1.73_m2} — ABNORMAL LOW (ref >59)

## 2023-08-30 LAB — QUANTIFERON-TB GOLD PLUS
QuantiFERON Mitogen Value: 9.24 IU/mL
QuantiFERON Nil Value: 0 IU/mL
QuantiFERON TB1 Ag Value: 0.01 IU/mL
QuantiFERON TB2 Ag Value: 0 IU/mL
QuantiFERON-TB Gold Plus: NEGATIVE

## 2023-08-30 LAB — CBC WITH DIFFERENTIAL/PLATELET
Basophils Absolute: 0 10*3/uL (ref 0.0–0.2)
Basos: 1 %
EOS (ABSOLUTE): 0.2 10*3/uL (ref 0.0–0.4)
Eos: 5 %
Hematocrit: 37.3 % (ref 34.0–46.6)
Hemoglobin: 11.8 g/dL (ref 11.1–15.9)
Immature Grans (Abs): 0 10*3/uL (ref 0.0–0.1)
Immature Granulocytes: 0 %
Lymphocytes Absolute: 1.1 10*3/uL (ref 0.7–3.1)
Lymphs: 26 %
MCH: 28.2 pg (ref 26.6–33.0)
MCHC: 31.6 g/dL (ref 31.5–35.7)
MCV: 89 fL (ref 79–97)
Monocytes Absolute: 0.6 10*3/uL (ref 0.1–0.9)
Monocytes: 14 %
Neutrophils Absolute: 2.3 10*3/uL (ref 1.4–7.0)
Neutrophils: 54 %
Platelets: 361 10*3/uL (ref 150–450)
RBC: 4.19 x10E6/uL (ref 3.77–5.28)
RDW: 12.8 % (ref 11.7–15.4)
WBC: 4.2 10*3/uL (ref 3.4–10.8)

## 2023-08-30 LAB — TSH: TSH: 1.75 u[IU]/mL (ref 0.450–4.500)

## 2023-08-30 LAB — HEPATITIS B SURFACE ANTIBODY,QUALITATIVE: Hep B Surface Ab, Qual: NONREACTIVE

## 2023-08-30 LAB — VARICELLA ZOSTER ANTIBODY, IGG: Varicella zoster IgG: 2643 index (ref >165)

## 2023-08-30 LAB — VITAMIN D 25 HYDROXY (VIT D DEFICIENCY, FRACTURES): Vit D, 25-Hydroxy: 42 ng/mL (ref 30.0–100.0)

## 2023-08-30 LAB — HEPATITIS B CORE ANTIBODY, TOTAL: Hep B Core Total Ab: NEGATIVE

## 2023-08-30 LAB — HEPATITIS B SURFACE ANTIGEN: Hepatitis B Surface Ag: NEGATIVE

## 2023-09-17 ENCOUNTER — Telehealth: Payer: Self-pay | Admitting: Hematology and Oncology

## 2023-09-17 NOTE — Telephone Encounter (Signed)
09/17/23; Due to a change in the provider schedule; I called patient and left a voice mail with her rescheduled appointment details. I mailed a reminder notice with the new appointment date and time.

## 2023-10-02 ENCOUNTER — Ambulatory Visit: Payer: BC Managed Care – PPO | Admitting: Hematology and Oncology

## 2023-10-10 ENCOUNTER — Telehealth: Payer: Self-pay | Admitting: Neurology

## 2023-10-10 NOTE — Telephone Encounter (Signed)
Called and Left detailed message per DPR. Advising urgent care or PCP if she started exhibiting any signs of active infection. Advised to call back if any more questions arise

## 2023-10-10 NOTE — Telephone Encounter (Signed)
Pt is asking for a call to discuss that her injection site for Interferon Beta-1b (BETASERON) 0.3 MG KIT injection on the inside may be infected, she is asking for a call to discuss.

## 2023-10-10 NOTE — Telephone Encounter (Signed)
Pt called, the response from Natalia Leatherwood, [place] was relayed.  No call back requested

## 2023-10-21 NOTE — Telephone Encounter (Signed)
Completed the forms and placed in MD office for review and signature

## 2023-10-22 ENCOUNTER — Encounter: Payer: Self-pay | Admitting: Emergency Medicine

## 2023-10-22 ENCOUNTER — Other Ambulatory Visit: Payer: Self-pay

## 2023-10-22 ENCOUNTER — Telehealth: Payer: Self-pay

## 2023-10-22 ENCOUNTER — Ambulatory Visit
Admission: EM | Admit: 2023-10-22 | Discharge: 2023-10-22 | Disposition: A | Discharge status: Home / Self Care | Payer: Medicare HMO | Attending: Internal Medicine | Admitting: Internal Medicine

## 2023-10-22 DIAGNOSIS — L03311 Cellulitis of abdominal wall: Secondary | ICD-10-CM

## 2023-10-22 MED ORDER — DOXYCYCLINE HYCLATE 100 MG PO CAPS: 100.0000 mg | ORAL_CAPSULE | Freq: Two times a day (BID) | ORAL | 0 refills | Status: DC

## 2023-10-22 NOTE — ED Provider Notes (Signed)
[facility]    CSN: [id number] Arrival date & time: 10/22/23  1447      History   Chief Complaint Chief Complaint  Patient presents with   Wound Check    HPI Joyce Bennett is a 67 y.o. female.   Patient presents for reevaluation of wound to abdominal wall.  Reports that she gets Betaseron injections every other day.  She had concern for cellulitis of the area after injections so PCP prescribed Bactrim on 10/14/2023.  She reports improvement but not complete resolution.  Denies any purulent drainage or fever. She states that she still has itchiness and has been applying cortisone cream. She is not administering injections in that area.    Wound Check    Past Medical History:  Diagnosis Date   Breast cancer (HCC)    Family history of breast cancer 08/10/2021   Family history of prostate cancer 08/10/2021   History of radiation therapy    left breast - 10/16/2021 - 11/17/2021 - Dr Antony Blackbird   Multiple sclerosis [facility])    Neurogenic bladder    Optic neuritis, left    Personal history of radiation therapy    Restless leg syndrome    Urinary tract infection    Recurrent    Patient Active Problem List   Diagnosis Date Noted   Genetic testing 08/24/2021   Family history of breast cancer 08/10/2021   Family history of prostate cancer 08/10/2021   Malignant neoplasm of lower-outer quadrant of left breast of female, estrogen receptor positive (HCC) 08/03/2021   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      Home Medications    Prior to Admission medications   Medication Sig Start Date End Date Taking? Authorizing Provider  doxycycline (VIBRAMYCIN) 100 MG capsule Take 1 capsule (100 mg total) by mouth 2 (two) times daily. 10/22/23  Yes Bridey Brookover, Rolly Salter E, FNP  CALCIUM PO Take by mouth daily.    [provider]  cetirizine (ZYRTEC ALLERGY) 10 MG tablet Take 1 tablet (10 mg total) by mouth daily for 10 days. 07/28/22 07/27/24  Gareth Eagle, PA-C  exemestane (AROMASIN) 25 MG tablet Take 1 tablet (25 mg total) by mouth daily after breakfast. 10/01/22   Serena Croissant, MD  fluticasone (FLONASE) 50 MCG/ACT nasal spray Place 1 spray into both nostrils 2 (two) times daily.    [provider]  fluticasone (FLONASE) 50 MCG/ACT nasal spray Place 2 sprays into both nostrils daily. 07/27/22   Palumbo, April, MD  Interferon Beta-1b (BETASERON) 0.3 MG KIT injection ADD 1.2 ML DILUENT TO VIAL AND MIX GENTLY. INJECT 1 ML UNDER THE SKIN EVERY OTHER DAY 08/26/23   Levert Feinstein, MD  Multiple Vitamin (MULTIVITAMIN) tablet Take 1 tablet by mouth daily.    [provider]  Multiple Vitamins-Minerals (ZINC PO) Take 1 tablet by mouth daily.    [provider]  olopatadine (PATANOL) 0.1 % ophthalmic solution  Place 1 drop into both eyes 2 (two) times daily.    [provider]  trimethoprim (TRIMPEX) 100 MG tablet Take 1 tablet (100 mg total) by mouth at bedtime as needed. 11/27/21   Serena Croissant, MD    Family History Family History  Problem Relation Age of Onset   Stomach cancer Sister        dx 5s   Breast cancer Maternal Aunt        dx after 73, x2 maternal aunts   Prostate cancer Maternal Uncle        dx 60s   Heart disease Maternal Grandmother    Breast cancer Paternal Grandmother        dx after 20   Multiple sclerosis Neg Hx     Social History Social History   Tobacco Use   Smoking  status: Never   Smokeless tobacco: Never  Substance Use Topics   Alcohol use: Yes    Comment: rare   Drug use: No     Allergies   Keflex [cephalexin] and Shellfish allergy   Review of Systems Review of Systems Per HPI  Physical Exam Triage Vital Signs ED Triage Vitals  Encounter Vitals Group     BP 10/22/23 1549 (!) 153/90     Systolic BP Percentile --      Diastolic BP Percentile --      Pulse Rate 10/22/23 1549 99     Resp 10/22/23 1549 18     Temp 10/22/23 1549 98.6 F (37 C)     Temp Source 10/22/23 1549 Oral     SpO2 10/22/23 1549 97 %     Weight --      Height --      Head Circumference --      Peak Flow --      Pain Score 10/22/23 1550 0     Pain Loc --      Pain Education --      Exclude from Growth Chart --    No data found.  Updated Vital Signs BP (!) 153/90 (BP Location: Left Arm)   Pulse 99   Temp 98.6 F (37 C) (Oral)   Resp 18   SpO2 97%   Visual Acuity Right Eye Distance:   Left Eye Distance:   Bilateral Distance:    Right Eye Near:   Left Eye Near:    Bilateral Near:     Physical Exam Constitutional:      General: She is not in acute distress.    Appearance: Normal appearance. She is not toxic-appearing or diaphoretic.  HENT:     Head: Normocephalic and atraumatic.  Eyes:     Extraocular Movements: Extraocular movements intact.     Conjunctiva/sclera: Conjunctivae normal.  Pulmonary:     Effort: Pulmonary effort is normal.  Skin:    Comments: Patient has area of redness with mild induration present to the right lower abdomen.  There is an open area to the center with purulent discoloration but no drainage.  No area of fluctuance.  No streaking. Entirety of erythema is about 3 inches in diameter.   Neurological:     General: No focal deficit present.     Mental Status: She is alert and oriented to person, place, and time. Mental status is at baseline.  Psychiatric:        Mood and Affect: Mood normal.        Behavior: Behavior  normal.  Thought Content: Thought content normal.        Judgment: Judgment normal.      UC Treatments / Results  Labs (all labs ordered are listed, but only abnormal results are displayed) Labs Reviewed - No data to display  EKG   Radiology No results found.  Procedures Procedures (including critical care time)  Medications Ordered in UC Medications - No data to display  Initial Impression / Assessment and Plan / UC Course  I have reviewed the triage vital signs and the nursing notes.  Pertinent labs & imaging results that were available during my care of the patient were reviewed by me and considered in my medical decision making (see chart for details).     Patient reports appearance has improved but I am still suspicious of mildly persistent cellulitis so will treat with additional antibiotic. Limited on antibiotics given patient's allergies so will treat with doxycycline.  Patient advised of the importance of following up with PCP.  She was also advised to follow-up with urgent care if not able to get in with PCP for reevaluation.  Patient verbalized understanding and was agreeable with plan. Final Clinical Impressions(s) / UC Diagnoses   Final diagnoses:  Cellulitis, abdominal wall     Discharge Instructions      I have prescribed you a different antibiotic to start taking today.  Take with food to avoid stomach upset.  Follow-up with your primary care doctor.    ED Prescriptions     Medication Sig Dispense Auth. Provider   doxycycline (VIBRAMYCIN) 100 MG capsule Take 1 capsule (100 mg total) by mouth 2 (two) times daily. 20 capsule Gustavus Bryant, [place]      PDMP not reviewed this encounter.   Gustavus Bryant, [place] 10/22/23 [phone]

## 2023-10-22 NOTE — ED Triage Notes (Signed)
Pt here for wound check to injection site on right side of abd; pt sts completed antibiotics  but is still having itching

## 2023-10-22 NOTE — Telephone Encounter (Signed)
Called and spoke to pt and stated that we were missing a signature on her pt assistance form. She agreed to come by tomorrow to sign

## 2023-10-22 NOTE — Discharge Instructions (Signed)
I have prescribed you a different antibiotic to start taking today.  Take with food to avoid stomach upset.  Follow-up with your primary care doctor.

## 2023-10-23 NOTE — Telephone Encounter (Signed)
Outgoing fax to [employer] pt assistance

## 2023-10-30 NOTE — Telephone Encounter (Signed)
Please let her continue interferon injection, she has changed to different injection sites  We have discussed different MS treatment options, if she wants to consider other treatment options, may move up her follow up with [facility]. Or with me

## 2023-10-30 NOTE — Telephone Encounter (Signed)
Patient reports having an injection site reaction to Interferon Beta-1b injection on 10/10/2023. She was told to contact urgent care or PCP if she exhibits any signs of active infection. Would you like the patient to continue injection?

## 2023-10-30 NOTE — Telephone Encounter (Signed)
Patient called and asked for a refill of Interferon Beta-1b (BETASERON) 0.3 MG KIT injection  She said the pharmacy told her to call us. She said she has one box left.

## 2023-10-31 NOTE — Telephone Encounter (Signed)
At 11:03 pt left vm with name of the 2 medications prescribed by Urgent care 1st was Sulfamethoxaz-ole and 2nd was Doxycycline Hyclate 100 mg

## 2023-10-31 NOTE — Telephone Encounter (Signed)
Call to patient, she reports she went to her PCP and Urgent care due to infection at injection site. She was given oral antibiotic and advised to use benadryl cream and neosporin and she reports that it is almost cleared up. She does not wish to change therapy as she feels thi works the best. Advised I would let Dr. Terrace Arabia know and if she changed her mind to reach back out. Patient verbalized understanding.

## 2023-11-05 ENCOUNTER — Inpatient Hospital Stay: Payer: Medicare HMO | Attending: Hematology and Oncology | Admitting: Hematology and Oncology

## 2023-11-05 VITALS — BP 135/75 | HR 102 | Temp 97.8°F | Resp 18 | Ht 62.25 in | Wt 174.8 lb

## 2023-11-05 DIAGNOSIS — Z79811 Long term (current) use of aromatase inhibitors: Secondary | ICD-10-CM | POA: Diagnosis not present

## 2023-11-05 DIAGNOSIS — C50512 Malignant neoplasm of lower-outer quadrant of left female breast: Secondary | ICD-10-CM | POA: Diagnosis not present

## 2023-11-05 DIAGNOSIS — I890 Lymphedema, not elsewhere classified: Secondary | ICD-10-CM | POA: Insufficient documentation

## 2023-11-05 DIAGNOSIS — Z170 Estrogen receptor positive status [ER+]: Secondary | ICD-10-CM | POA: Insufficient documentation

## 2023-11-05 DIAGNOSIS — Z923 Personal history of irradiation: Secondary | ICD-10-CM | POA: Diagnosis not present

## 2023-11-05 MED ORDER — EXEMESTANE 25 MG PO TABS: 25.0000 mg | ORAL_TABLET | Freq: Every day | ORAL | 3 refills | Status: DC

## 2023-11-05 NOTE — Progress Notes (Signed)
Patient Care Team: Stevphen Rochester, MD as PCP - General (Family Medicine) Griselda Miner, MD as Consulting Physician (General Surgery) Serena Croissant, MD as Consulting Physician (Hematology and Oncology) Antony Blackbird, MD as Consulting Physician (Radiation Oncology) Zelphia Cairo, MD as Consulting Physician (Obstetrics and Gynecology)  DIAGNOSIS:  Encounter Diagnosis  Name Primary?   Malignant neoplasm of lower-outer quadrant of left breast of female, estrogen receptor positive (HCC) Yes    SUMMARY OF ONCOLOGIC HISTORY: Oncology History  Malignant neoplasm of lower-outer quadrant of left breast of female, estrogen receptor positive (HCC)  08/03/2021 Initial Diagnosis   Left diagnostic mammogram: suspicious calcifications in the lower outer quadrant of the left breast. 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    08/09/2021 Cancer Staging   Staging form: Breast, AJCC 8th Edition - Clinical stage from 08/09/2021: Stage IA (cT41mi, cN0, cM0, G2, ER+, PR-, HER2-) - Signed by Serena Croissant, MD on 08/09/2021 Stage prefix: Initial diagnosis Histologic grading system: 3 grade system   08/18/2021 Genetic Testing   Negative hereditary cancer genetic testing: no pathogenic variants detected in [facility] BRCAPlus Panel or [facility] CancerNext-Expanded +RNAinsight Panel.  The report dates are August 18, 2021 and August 25, 2021, respectively.   The BRCAplus panel offered by [employer] and includes sequencing and deletion/duplication analysis for the following 8 genes: ATM, BRCA1, BRCA2, CDH1, CHEK2, PALB2, PTEN, and TP53.  The CancerNext-Expanded gene panel offered by [facility] and includes sequencing, rearrangement, and RNA analysis for the following 77 genes: AIP, ALK, APC, ATM, AXIN2, BAP1, BARD1, BLM, BMPR1A, BRCA1, BRCA2, BRIP1, CDC73, CDH1, CDK4, CDKN1B, CDKN2A, CHEK2, CTNNA1, DICER1, FANCC, FH, FLCN, GALNT12, KIF1B, LZTR1, MAX, MEN1, MET, MLH1,  MSH2, MSH3, MSH6, MUTYH, NBN, NF1, NF2, NTHL1, PALB2, PHOX2B, PMS2, POT1, PRKAR1A, PTCH1, PTEN, RAD51C, RAD51D, RB1, RECQL, RET, SDHA, SDHAF2, SDHB, SDHC, SDHD, SMAD4, SMARCA4, SMARCB1, SMARCE1, STK11, SUFU, TMEM127, TP53, TSC1, TSC2, VHL and XRCC2 (sequencing and deletion/duplication); EGFR, EGLN1, HOXB13, KIT, MITF, PDGFRA, POLD1, and POLE (sequencing only); EPCAM and GREM1 (deletion/duplication only).    09/01/2021 Surgery   Left lumpectomy: Focal residual DCIS high-grade, no residual invasive ductal carcinoma, resection margins negative, 0/4 lymph nodes negative, tumor size: 0.1 cm, ER 95%, PR 0%, HER2 1+, Ki-67 50%   10/16/2021 - 11/17/2021 Radiation Therapy   Site Technique Total Dose (Gy) Dose per Fx (Gy) Completed Fx Beam Energies  Breast, Left: Breast_Lt 3D 40.05/40.05 2.67 15/15 10X  Breast, Left: Breast_Lt_Bst 3D 12/12 2 6/6 6X, 10X     11/27/2021 -  Anti-estrogen oral therapy   Started with anastrozole, developed bilateral eye irritation. Switched to exemestane 02/08/2022.     CHIEF COMPLIANT: Follow-up on exemestane  HISTORY OF PRESENT ILLNESS:   History of Present Illness   The patient, with a history of DCIS diagnosed two years ago, presents for a routine follow-up. She reports feeling fine and denies any new health issues. She is currently on exemestane, which she tolerates well. She experiences occasional hot flashes, mostly at night, but does not find them bothersome. She denies any joint stiffness or breast pain. She reports a recent infection following an injection, which was treated with doxycycline. However, she believes she may be allergic to doxycycline and has discontinued it. She also takes Zyrtec and Patanol, which she tolerates well. She has been keeping up with her mammograms, the most recent of which was in May. She also reports some lymphedema following her surgery for DCIS.  ALLERGIES:  is allergic to keflex [cephalexin] and shellfish  allergy.  MEDICATIONS:  Current Outpatient Medications  Medication Sig Dispense Refill   CALCIUM PO Take by mouth daily.     cetirizine (ZYRTEC ALLERGY) 10 MG tablet Take 1 tablet (10 mg total) by mouth daily for 10 days. 10 tablet 0   exemestane (AROMASIN) 25 MG tablet Take 1 tablet (25 mg total) by mouth daily after breakfast. 90 tablet 3   fluticasone (FLONASE) 50 MCG/ACT nasal spray Place 2 sprays into both nostrils daily. 16 mL 0   Interferon Beta-1b (BETASERON) 0.3 MG KIT injection ADD 1.2 ML DILUENT TO VIAL AND MIX GENTLY. INJECT 1 ML UNDER THE SKIN EVERY OTHER DAY 42 kit 3   Multiple Vitamin (MULTIVITAMIN) tablet Take 1 tablet by mouth daily.     Multiple Vitamins-Minerals (ZINC PO) Take 1 tablet by mouth daily.     olopatadine (PATANOL) 0.1 % ophthalmic solution Place 1 drop into both eyes 2 (two) times daily.     trimethoprim (TRIMPEX) 100 MG tablet Take 1 tablet (100 mg total) by mouth at bedtime as needed.     No current facility-administered medications for this visit.   Facility-Administered Medications Ordered in Other Visits  Medication Dose Route Frequency Provider Last Rate Last Admin   gadopentetate dimeglumine (MAGNEVIST) injection 15 mL  15 mL Intravenous Once PRN York Spaniel, MD        PHYSICAL EXAMINATION: ECOG PERFORMANCE STATUS: 1 - Symptomatic but completely ambulatory  Vitals:   11/05/23 0916  BP: 135/75  Pulse: (!) 102  Resp: 18  Temp: 97.8 F (36.6 C)  SpO2: 100%   Filed Weights   11/05/23 0916  Weight: 174 lb 12.8 oz (79.3 kg)    Physical Exam   BREAST: Lymphedema present in area affected by previous surgery and radiation.      (exam performed in the presence of a chaperone)  LABORATORY DATA:  I have reviewed the data as listed    Latest Ref Rng & Units 08/27/2023   10:48 AM 07/28/2022    1:29 AM 04/23/2022   11:05 AM  CMP  Glucose 70 - 99 mg/dL 86  98  59   BUN 8 - 27 mg/dL 15  21  12    Creatinine 0.57 - 1.00 mg/dL 8.46  9.62   9.52   Sodium 134 - 144 mmol/L 144  139  142   Potassium 3.5 - 5.2 mmol/L 4.4  4.2  4.7   Chloride 96 - 106 mmol/L 104  106  103   CO2 20 - 29 mmol/L 26  25  25    Calcium 8.7 - 10.3 mg/dL 9.7  9.2  84.1   Total Protein 6.0 - 8.5 g/dL 7.3   7.8   Total Bilirubin 0.0 - 1.2 mg/dL 0.3   0.3   Alkaline Phos 44 - 121 IU/L 183   150   AST 0 - 40 IU/L 23   25   ALT 0 - 32 IU/L 14   13     Lab Results  Component Value Date   WBC 4.2 08/27/2023   HGB 11.8 08/27/2023   HCT 37.3 08/27/2023   MCV 89 08/27/2023   PLT 361 08/27/2023   NEUTROABS 2.3 08/27/2023    ASSESSMENT & PLAN:  Malignant neoplasm of lower-outer quadrant of left breast of female, estrogen receptor positive (HCC) 09/01/2021:Left lumpectomy: Focal residual DCIS high-grade, no residual invasive ductal carcinoma, resection margins negative, 0/4 lymph nodes negative, tumor size:  0.1 cm, ER 95%, PR 0%, HER2 1+, Ki-67 50%   Treatment plan: 1.  Given the small size of the invasive ductal carcinoma there is no role of Oncotype DX testing. 2. adjuvant radiation therapy completed 11/17/21 3.  Adjuvant antiestrogens with Letrozole for 5 years started 11/27/2021   Letrozole toxicities: Tolerating antiestrogen therapy fairly well   Breast cancer surveillance: 1.  Breast exam 11/05/2023: Benign 2. 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 No breast pain or discomfort. -Continue exemestane, refill prescription for one year. -Continue annual mammograms, next due in May 2025.  Lymphedema Noted in the right arm, likely secondary to surgery. -Advise nightly massage with oil or lotion, directing  upwards towards the armpit to promote drainage.  General Health Maintenance -Next follow-up appointment in one year.          No orders of the defined types were placed in this encounter.  The patient has a good understanding of the overall plan. she agrees with it. she will call with any problems that may develop before the next visit here. Total time spent: 30 mins including face to face time and time spent for planning, charting and co-ordination of care   Tamsen Meek, MD 11/05/23

## 2023-11-05 NOTE — Assessment & Plan Note (Signed)
09/01/2021:Left lumpectomy: Focal residual DCIS high-grade, no residual invasive ductal carcinoma, resection margins negative, 0/4 lymph nodes negative, tumor size: 0.1 cm, ER 95%, PR 0%, HER2 1+, Ki-67 50%   Treatment plan: 1.  Given the small size of the invasive ductal carcinoma there is no role of Oncotype DX testing. 2. adjuvant radiation therapy completed 11/17/21 3.  Adjuvant antiestrogens with Letrozole for 5 years started 11/27/2021   Letrozole toxicities: Tolerating antiestrogen therapy fairly well   Breast cancer surveillance: 1.  Breast exam 11/05/2023: Benign 2. mammogram 05/10/2023: Benign breast density category B Brain MRI 07/28/2022: Periventricular FLAIR hyperintensity consistent with multiple sclerosis unchanged since 2020   Return to clinic in 1 year for follow-up

## 2024-01-23 ENCOUNTER — Other Ambulatory Visit: Payer: Self-pay | Admitting: Hematology and Oncology

## 2024-01-23 ENCOUNTER — Encounter: Payer: Self-pay | Admitting: Hematology and Oncology

## 2024-01-23 DIAGNOSIS — Z9889 Other specified postprocedural states: Secondary | ICD-10-CM

## 2024-01-27 ENCOUNTER — Telehealth: Payer: Self-pay | Admitting: Neurology

## 2024-01-27 MED ORDER — BETASERON 0.3 MG ~~LOC~~ KIT: PACK | SUBCUTANEOUS | 3 refills | Status: DC

## 2024-01-27 NOTE — Telephone Encounter (Signed)
 Call to patient, she verbalized understanding Dr. Terrace Arabia recommendations trying OTC lidocaine or benadryl cream. If no relief can give low dose gabapentin. Patient agree to keep [place] posted.

## 2024-01-27 NOTE — Telephone Encounter (Signed)
 Pt is requesting a refill for Interferon Beta-1b (BETASERON) 0.3 MG KIT injection .  Pharmacy: [facility]

## 2024-01-27 NOTE — Telephone Encounter (Signed)
 Call to patient, she reports her feet itching for several weeks. She saw her PCP and they didn't find anything. She stated it could be coming from nerves. I advised I wold send this to Dr. Terrace Arabia to see if related to MS but she may need to contact PCP for new referral if unrelated. Patient verbalized understanding.

## 2024-01-27 NOTE — Telephone Encounter (Signed)
 Pt is asking for a call from RN to discuss her itching feet, this has been going on for weeks, please call to discuss.

## 2024-01-27 NOTE — Telephone Encounter (Signed)
 Less likely related to MS,  She may try over-the-counter lidocaine cream, or Benadryl cream as needed,  We can prescribe gabapentin as needed if she needs

## 2024-01-27 NOTE — Telephone Encounter (Signed)
 Called [facility] at [phone]) and they stated that we could sent to [facility] in [facility] pharmacy Refill sent

## 2024-01-27 NOTE — Telephone Encounter (Signed)
 Pt called about referral for itchy feet. Pt said PCP  was suppose to send a referral.

## 2024-01-30 NOTE — Therapy (Signed)
 OUTPATIENT PHYSICAL THERAPY SOZO SCREENING NOTE   Patient Name: Joyce Bennett MRN: [id number] DOB:10-16-1956, 68 y.o., female Today's Date: 02/03/2024  PCP: Stevphen Rochester, MD REFERRING PROVIDER: Griselda Miner, MD   PT End of Session - 02/03/24 1042     Visit Number 3   unchanged due to screen only   PT Start Time 1042    PT Stop Time 1044    PT Time Calculation (min) 2 min              Past Medical History:  Diagnosis Date   Breast cancer (HCC)    Family history of breast cancer 08/10/2021   Family history of prostate cancer 08/10/2021   History of radiation therapy    left breast - 10/16/2021 - 11/17/2021 - Dr Antony Blackbird   Multiple sclerosis [facility])    Neurogenic bladder    Optic neuritis, left    Personal history of radiation therapy    Restless leg syndrome    Urinary tract infection    Recurrent   Past Surgical History:  Procedure Laterality Date   BREAST LUMPECTOMY Left    08-2021   BREAST LUMPECTOMY WITH RADIOACTIVE SEED AND SENTINEL LYMPH NODE BIOPSY Left 09/01/2021   Procedure: LEFT BREAST LUMPECTOMY WITH RADIOACTIVE SEED AND SENTINEL LYMPH NODE BIOPSY;  Surgeon: Griselda Miner, MD;  Location: [facility];  Service: General;  Laterality: Left;   FOOT SURGERY N/A    HAND SURGERY Right    Patient Active Problem List   Diagnosis Date Noted   Genetic testing 08/24/2021   Family history of breast cancer 08/10/2021   Family history of prostate cancer 08/10/2021   Malignant neoplasm of lower-outer quadrant of left breast of female, estrogen receptor positive (HCC) 08/03/2021   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 She underwent a left lumpectomy and sentinel node biopsy (4 negative nodes) on 09/01/2021. It is ER positive, PR negative, and HER2 negative with a Ki67 of 50%. She had right hand surgery 40 years ago with hardware placed but it has since been removed  PRECAUTIONS: left UE Lymphedema risk, None  SUBJECTIVE: Pt returns for her last 6 month L-Dex screen.   PAIN:  Are you having pain? No  SOZO SCREENING: Patient was assessed today using the SOZO machine to determine the lymphedema index score. This was compared to her baseline score. It was determined that she is within the recommended range when compared to her baseline and no further action is needed at this time. She will continue SOZO screenings. These are done every 3 months for 2 years post operatively followed by every 6 months for 2 years, and then annually.   L-DEX FLOWSHEETS - 02/03/24 1000       L-DEX LYMPHEDEMA SCREENING   Measurement Type Unilateral    L-DEX MEASUREMENT EXTREMITY Upper Extremity    POSITION  Standing    DOMINANT SIDE Right    At Risk Side Left    BASELINE SCORE (UNILATERAL) 4.5    L-DEX SCORE (UNILATERAL) 5    VALUE CHANGE (UNILAT) 0.5             P: 6 month L-Dex  screens next.   [facility] [place], PT 02/03/2024, 10:45 AM

## 2024-02-03 ENCOUNTER — Ambulatory Visit: Payer: Medicare HMO | Attending: General Surgery | Admitting: Physical Therapy

## 2024-02-03 DIAGNOSIS — C50412 Malignant neoplasm of upper-outer quadrant of left female breast: Secondary | ICD-10-CM | POA: Insufficient documentation

## 2024-02-03 DIAGNOSIS — Z170 Estrogen receptor positive status [ER+]: Secondary | ICD-10-CM | POA: Insufficient documentation

## 2024-02-03 MED ORDER — GABAPENTIN 300 MG PO CAPS: 600.0000 mg | ORAL_CAPSULE | Freq: Every evening | ORAL | 11 refills | Status: DC | PRN

## 2024-02-03 NOTE — Addendum Note (Signed)
 Addended by: Levert Feinstein on: 02/03/2024 02:20 PM   Modules accepted: Orders

## 2024-02-03 NOTE — Telephone Encounter (Signed)
 Meds ordered this encounter  Medications   gabapentin (NEURONTIN) 300 MG capsule    Sig: Take 2 capsules (600 mg total) by mouth at bedtime as needed.    Dispense:  60 capsule    Refill:  11

## 2024-02-03 NOTE — Addendum Note (Signed)
 Addended by: Lenn Cal on: 02/03/2024 10:36 AM   Modules accepted: Orders

## 2024-02-03 NOTE — Telephone Encounter (Signed)
 Called and informed pt gabapentin sent. She voiced gratitude and understanding of all discussed

## 2024-02-03 NOTE — Telephone Encounter (Signed)
 Called [facility] at [phone]) to check status as I sent via escribe sent to [facility] in [place] [facility] on 01/27/24 and E-Prescribing Status: Receipt confirmed by pharmacy (01/27/2024 10:28 AM EST)   The represenatative confirmed received request. And they will be contacting the pt to schedule deliveries

## 2024-02-03 NOTE — Telephone Encounter (Signed)
 We received referral for this patient from PCP for this issue, ok to schedule for new appointment with Dr. Terrace Arabia? Pt has a f/u scheduled with Aundra Millet in May. Thank you

## 2024-02-03 NOTE — Telephone Encounter (Signed)
 Spoke with patient and advised of below-she states she has been taking Benadryl every night before bed, the itching is horrible in the morning, states feels like it is on the inside of her feet. Would like Gabapentin sent in please.

## 2024-02-03 NOTE — Telephone Encounter (Addendum)
 Pt said, spoke with [facility] they informed do not have prescription need to fax to [phone] in order to refill for  Interferon Beta-1b (BETASERON) 0.3 MG KIT injection. Informed patient of previous message.

## 2024-02-06 ENCOUNTER — Emergency Department (HOSPITAL_BASED_OUTPATIENT_CLINIC_OR_DEPARTMENT_OTHER): Payer: Medicare HMO

## 2024-02-06 ENCOUNTER — Emergency Department (HOSPITAL_BASED_OUTPATIENT_CLINIC_OR_DEPARTMENT_OTHER)
Admission: EM | Admit: 2024-02-06 | Discharge: 2024-02-06 | Disposition: A | Discharge status: Home / Self Care | Payer: Medicare HMO | Attending: Emergency Medicine | Admitting: Emergency Medicine

## 2024-02-06 ENCOUNTER — Ambulatory Visit
Admission: EM | Admit: 2024-02-06 | Discharge: 2024-02-06 | Disposition: A | Discharge status: Home / Self Care | Payer: Medicare HMO

## 2024-02-06 ENCOUNTER — Other Ambulatory Visit: Payer: Self-pay

## 2024-02-06 DIAGNOSIS — S01112A Laceration without foreign body of left eyelid and periocular area, initial encounter: Secondary | ICD-10-CM | POA: Diagnosis not present

## 2024-02-06 DIAGNOSIS — Z853 Personal history of malignant neoplasm of breast: Secondary | ICD-10-CM | POA: Diagnosis not present

## 2024-02-06 DIAGNOSIS — W101XXA Fall (on)(from) sidewalk curb, initial encounter: Secondary | ICD-10-CM | POA: Diagnosis not present

## 2024-02-06 DIAGNOSIS — Z79899 Other long term (current) drug therapy: Secondary | ICD-10-CM | POA: Insufficient documentation

## 2024-02-06 DIAGNOSIS — S0993XA Unspecified injury of face, initial encounter: Secondary | ICD-10-CM | POA: Diagnosis present

## 2024-02-06 DIAGNOSIS — S0990XA Unspecified injury of head, initial encounter: Secondary | ICD-10-CM

## 2024-02-06 DIAGNOSIS — R930 Abnormal findings on diagnostic imaging of skull and head, not elsewhere classified: Secondary | ICD-10-CM | POA: Insufficient documentation

## 2024-02-06 DIAGNOSIS — S0181XA Laceration without foreign body of other part of head, initial encounter: Secondary | ICD-10-CM

## 2024-02-06 MED ORDER — LIDOCAINE HCL (PF) 1 % IJ SOLN
5.0000 mL | Freq: Once | INTRAMUSCULAR | Status: AC
  Administered 2024-02-06: 5 mL via INTRADERMAL
  Filled 2024-02-06: qty 5

## 2024-02-06 MED ORDER — LIDOCAINE-EPINEPHRINE-TETRACAINE (LET) TOPICAL GEL
3.0000 mL | Freq: Once | TOPICAL | Status: AC
Start: 2024-02-06 — End: 2024-02-06
  Administered 2024-02-06: 3 mL via TOPICAL
  Filled 2024-02-06: qty 3

## 2024-02-06 MED ORDER — ACETAMINOPHEN 325 MG PO TABS
650.0000 mg | ORAL_TABLET | Freq: Once | ORAL | Status: AC
  Administered 2024-02-06: 650 mg via ORAL
  Filled 2024-02-06: qty 2

## 2024-02-06 NOTE — ED Provider Notes (Signed)
 Seen by provider in triage advised to present immediately to the emergency department as she sustained a fall at a local retail store face forward which resulted in a laceration to her forehead.  CT is indicated to rule out head trauma related to fall.  Patient is being transported by her daughter to either [facility] or [facility] for further evaluation.   Bing Neighbors, NP 02/06/24 1515

## 2024-02-06 NOTE — ED Triage Notes (Signed)
 Fell stepping off curb at [facility]. Struck front left head-laceration ~1.5cm. Broke tooth. Tongue free of laceration. No LOC. No neck pain through ROM or palpation. Around 1500. PEARRL

## 2024-02-06 NOTE — ED Provider Notes (Signed)
 [facility] AT [facility] Provider Note   CSN: [id number] Arrival date & time: 02/06/24  1550     History {Add pertinent medical, surgical, social history, OB history to HPI:1} No chief complaint on file.   Joyce Bennett is a 68 y.o. female. No LOC, seizure, blood thinners.  HPI     Home Medications Prior to Admission medications   Medication Sig Start Date End Date Taking? Authorizing Provider  CALCIUM PO Take by mouth daily.    [provider]  cetirizine (ZYRTEC ALLERGY) 10 MG tablet Take 1 tablet (10 mg total) by mouth daily for 10 days. 07/28/22 07/27/24  Gareth Eagle, PA-C  exemestane (AROMASIN) 25 MG tablet Take 1 tablet (25 mg total) by mouth daily after breakfast. 11/05/23   Serena Croissant, MD  fluticasone (FLONASE) 50 MCG/ACT nasal spray Place 2 sprays into both nostrils daily. 07/27/22   Palumbo, April, MD  gabapentin (NEURONTIN) 300 MG capsule Take 2 capsules (600 mg total) by mouth at bedtime as needed. 02/03/24   Levert Feinstein, MD  Interferon Beta-1b (BETASERON) 0.3 MG KIT injection ADD 1.2 ML DILUENT TO VIAL AND MIX GENTLY. INJECT 1 ML UNDER THE SKIN EVERY OTHER DAY 01/27/24   Levert Feinstein, MD  Multiple Vitamin (MULTIVITAMIN) tablet Take 1 tablet by mouth daily.    [provider]  Multiple Vitamins-Minerals (ZINC PO) Take 1 tablet by mouth daily.    [provider]  olopatadine (PATANOL) 0.1 % ophthalmic solution Place 1 drop into both eyes 2 (two) times daily.    [provider]  trimethoprim (TRIMPEX) 100 MG tablet Take 1 tablet (100 mg total) by mouth at bedtime as needed. 11/27/21   Serena Croissant, MD      Allergies    Keflex [cephalexin] and Shellfish allergy    Review of Systems   Review of Systems  Physical Exam Updated Vital Signs BP (!) 163/100   Pulse 93   Temp 98.6 F (37 C)   Resp 15   SpO2 100%  Physical Exam  ED Results / Procedures / Treatments   Labs (all labs ordered are  listed, but only abnormal results are displayed) Labs Reviewed - No data to display  EKG None  Radiology CT Head Wo Contrast Result Date: 02/06/2024 CLINICAL DATA:  Recent fall with blunt head trauma and dental damage, initial encounter EXAM: CT HEAD WITHOUT CONTRAST CT MAXILLOFACIAL WITHOUT CONTRAST CT CERVICAL SPINE WITHOUT CONTRAST TECHNIQUE: Multidetector CT imaging of the head, cervical spine, and maxillofacial structures were performed using the standard protocol without intravenous contrast. Multiplanar CT image reconstructions of the cervical spine and maxillofacial structures were also generated. RADIATION DOSE REDUCTION: This exam was performed according to the departmental dose-optimization program which includes automated exposure control, adjustment of the mA and/or kV according to patient size and/or use of iterative reconstruction technique. COMPARISON:  None Available. FINDINGS: CT HEAD FINDINGS Brain: No evidence of acute infarction, hemorrhage, hydrocephalus, extra-axial collection or mass lesion/mass effect. Mild chronic white matter ischemic changes noted. Vascular: No hyperdense vessel or unexpected calcification. Skull: Normal. Negative for fracture or focal lesion. Other: Traumatic Brain Injury Risk Stratification Skull Fracture: No - Low/mBIG 1 Subdural Hematoma (SDH): No - Low Subarachnoid Hemorrhage [facility]): No Epidural Hematoma (EDH): No - Low/mBIG 1 Cerebral contusion, intra-axial, intraparenchymal Hemorrhage (IPH): No Intraventricular Hemorrhage (IVH): No - Low/mBIG 1 Midline Shift > 1mm or Edema/effacement of sulci/vents: No - Low/mBIG 1 ---------------------------------------------------- CT MAXILLOFACIAL FINDINGS Osseous: No fracture or mandibular dislocation.  No destructive process. Dental hardware precludes adequate evaluation of the teeth. Orbits: Orbits and their contents are within normal limits. Sinuses: Paranasal sinuses are well aerated. Soft tissues: No soft tissue  abnormality is noted. CT CERVICAL SPINE FINDINGS Alignment: Within normal limits. Skull base and vertebrae: 7 cervical segments are well visualized. Vertebral body height is well maintained. Mild osteophytic changes are noted at C6-7 anteriorly. Very mild facet hypertrophic changes are noted. No acute fracture or acute facet abnormality is noted. Soft tissues and spinal canal: Surrounding soft tissue structures are within normal limits. No focal hematoma is seen. Upper chest: Visualized lung apices are unremarkable. Other: None IMPRESSION: CT of the head: Mild chronic white matter ischemic changes. No acute intracranial abnormality noted. CT of the maxillofacial bones: No acute bony abnormality is noted. CT of the cervical spine: Mild degenerative change without acute abnormality. Electronically Signed   By: Alcide Clever M.D.   On: 02/06/2024 19:52   CT Cervical Spine Wo Contrast Result Date: 02/06/2024 CLINICAL DATA:  Recent fall with blunt head trauma and dental damage, initial encounter EXAM: CT HEAD WITHOUT CONTRAST CT MAXILLOFACIAL WITHOUT CONTRAST CT CERVICAL SPINE WITHOUT CONTRAST TECHNIQUE: Multidetector CT imaging of the head, cervical spine, and maxillofacial structures were performed using the standard protocol without intravenous contrast. Multiplanar CT image reconstructions of the cervical spine and maxillofacial structures were also generated. RADIATION DOSE REDUCTION: This exam was performed according to the departmental dose-optimization program which includes automated exposure control, adjustment of the mA and/or kV according to patient size and/or use of iterative reconstruction technique. COMPARISON:  None Available. FINDINGS: CT HEAD FINDINGS Brain: No evidence of acute infarction, hemorrhage, hydrocephalus, extra-axial collection or mass lesion/mass effect. Mild chronic white matter ischemic changes noted. Vascular: No hyperdense vessel or unexpected calcification. Skull: Normal. Negative  for fracture or focal lesion. Other: Traumatic Brain Injury Risk Stratification Skull Fracture: No - Low/mBIG 1 Subdural Hematoma (SDH): No - Low Subarachnoid Hemorrhage [facility]): No Epidural Hematoma (EDH): No - Low/mBIG 1 Cerebral contusion, intra-axial, intraparenchymal Hemorrhage (IPH): No Intraventricular Hemorrhage (IVH): No - Low/mBIG 1 Midline Shift > 1mm or Edema/effacement of sulci/vents: No - Low/mBIG 1 ---------------------------------------------------- CT MAXILLOFACIAL FINDINGS Osseous: No fracture or mandibular dislocation. No destructive process. Dental hardware precludes adequate evaluation of the teeth. Orbits: Orbits and their contents are within normal limits. Sinuses: Paranasal sinuses are well aerated. Soft tissues: No soft tissue abnormality is noted. CT CERVICAL SPINE FINDINGS Alignment: Within normal limits. Skull base and vertebrae: 7 cervical segments are well visualized. Vertebral body height is well maintained. Mild osteophytic changes are noted at C6-7 anteriorly. Very mild facet hypertrophic changes are noted. No acute fracture or acute facet abnormality is noted. Soft tissues and spinal canal: Surrounding soft tissue structures are within normal limits. No focal hematoma is seen. Upper chest: Visualized lung apices are unremarkable. Other: None IMPRESSION: CT of the head: Mild chronic white matter ischemic changes. No acute intracranial abnormality noted. CT of the maxillofacial bones: No acute bony abnormality is noted. CT of the cervical spine: Mild degenerative change without acute abnormality. Electronically Signed   By: Alcide Clever M.D.   On: 02/06/2024 19:52   CT Maxillofacial Wo Contrast Result Date: 02/06/2024 CLINICAL DATA:  Recent fall with blunt head trauma and dental damage, initial encounter EXAM: CT HEAD WITHOUT CONTRAST CT MAXILLOFACIAL WITHOUT CONTRAST CT CERVICAL SPINE WITHOUT CONTRAST TECHNIQUE: Multidetector CT imaging of the head, cervical spine, and maxillofacial  structures were performed using the standard protocol without intravenous contrast. Multiplanar  CT image reconstructions of the cervical spine and maxillofacial structures were also generated. RADIATION DOSE REDUCTION: This exam was performed according to the departmental dose-optimization program which includes automated exposure control, adjustment of the mA and/or kV according to patient size and/or use of iterative reconstruction technique. COMPARISON:  None Available. FINDINGS: CT HEAD FINDINGS Brain: No evidence of acute infarction, hemorrhage, hydrocephalus, extra-axial collection or mass lesion/mass effect. Mild chronic white matter ischemic changes noted. Vascular: No hyperdense vessel or unexpected calcification. Skull: Normal. Negative for fracture or focal lesion. Other: Traumatic Brain Injury Risk Stratification Skull Fracture: No - Low/mBIG 1 Subdural Hematoma (SDH): No - Low Subarachnoid Hemorrhage [facility]): No Epidural Hematoma (EDH): No - Low/mBIG 1 Cerebral contusion, intra-axial, intraparenchymal Hemorrhage (IPH): No Intraventricular Hemorrhage (IVH): No - Low/mBIG 1 Midline Shift > 1mm or Edema/effacement of sulci/vents: No - Low/mBIG 1 ---------------------------------------------------- CT MAXILLOFACIAL FINDINGS Osseous: No fracture or mandibular dislocation. No destructive process. Dental hardware precludes adequate evaluation of the teeth. Orbits: Orbits and their contents are within normal limits. Sinuses: Paranasal sinuses are well aerated. Soft tissues: No soft tissue abnormality is noted. CT CERVICAL SPINE FINDINGS Alignment: Within normal limits. Skull base and vertebrae: 7 cervical segments are well visualized. Vertebral body height is well maintained. Mild osteophytic changes are noted at C6-7 anteriorly. Very mild facet hypertrophic changes are noted. No acute fracture or acute facet abnormality is noted. Soft tissues and spinal canal: Surrounding soft tissue structures are within normal  limits. No focal hematoma is seen. Upper chest: Visualized lung apices are unremarkable. Other: None IMPRESSION: CT of the head: Mild chronic white matter ischemic changes. No acute intracranial abnormality noted. CT of the maxillofacial bones: No acute bony abnormality is noted. CT of the cervical spine: Mild degenerative change without acute abnormality. Electronically Signed   By: Alcide Clever M.D.   On: 02/06/2024 19:52    Procedures .Laceration Repair  Date/Time: 02/06/2024 11:31 PM  Performed by: Dorthy Cooler, PA-C Authorized by: Dorthy Cooler, PA-C   Consent:    Consent obtained:  Verbal   Consent given by:  Patient Universal protocol:    Patient identity confirmed:  Verbally with patient Anesthesia:    Anesthesia method:  Topical application   Topical anesthetic:  LET Laceration details:    Location:  Face   Face location:  L eyebrow   Length (cm):  2 Pre-procedure details:    Preparation:  Patient was prepped and draped in usual sterile fashion Exploration:    Hemostasis achieved with:  Direct pressure Treatment:    Area cleansed with:  Povidone-iodine   Amount of cleaning:  Standard   Irrigation solution:  Sterile saline   Irrigation volume:    Irrigation method:  Pressure wash Skin repair:    Repair method:  Sutures   Suture size:  4-0   Suture material:  Chromic gut   Suture technique:  Simple interrupted   Number of sutures:  2 Approximation:    Approximation:  Close Repair type:    Repair type:  Simple Post-procedure details:    Dressing:  Antibiotic ointment and non-adherent dressing   Procedure completion:  Tolerated well, no immediate complications   {Document cardiac monitor, telemetry assessment procedure when appropriate:1}  Medications Ordered in ED Medications - No data to display  ED Course/ Medical Decision Making/ A&P   {   Click here for ABCD2, HEART and other calculatorsREFRESH Note before signing :1}  Medical Decision Making Amount and/or Complexity of Data Reviewed Radiology: ordered.  Risk OTC drugs. Prescription drug management.   ***  {Document critical care time when appropriate:1} {Document review of labs and clinical decision tools ie heart score, Chads2Vasc2 etc:1}  {Document your independent review of radiology images, and any outside records:1} {Document your discussion with family members, caretakers, and with consultants:1} {Document social determinants of health affecting pt's care:1} {Document your decision making why or why not admission, treatments were needed:1} Final Clinical Impression(s) / ED Diagnoses Final diagnoses:  None    Rx / DC Orders ED Discharge Orders     None

## 2024-02-06 NOTE — ED Notes (Signed)
 C-Collar in place due to mechanism.

## 2024-02-06 NOTE — Discharge Instructions (Addendum)
 It was a pleasure caring for you today.  Please follow-up with your primary care provider in the next 48 to 72 hours.  Seek emergency care if experience any new or worsening symptoms.  Absorbable sutures: Keep sutures dry. Please refrain from swimming or bathing. Area can be gently cleaned with mild soap and water after 24-48 hours. Sutures will absorb and fall out within 7-10 days.  Alternating between 650 mg Tylenol and 400 mg Advil: The best way to alternate taking Acetaminophen (example Tylenol) and Ibuprofen (example Advil/Motrin) is to take them 3 hours apart. For example, if you take ibuprofen at 6 am you can then take Tylenol at 9 am. You can continue this regimen throughout the day, making sure you do not exceed the recommended maximum dose for each drug.

## 2024-02-06 NOTE — ED Triage Notes (Signed)
 Pt states she was walking out of [employer] and tripped, falling on face. Head bandaged, bleeding from mouth upon arrival.  No LOC. No dizziness/n/v.

## 2024-02-06 NOTE — ED Notes (Signed)
 Patient is being discharged from the Urgent Care and sent to the Emergency Department via POV . Per Jerrilyn Cairo, NP, patient is in need of higher level of care due to fall and head injury. Patient is aware and verbalizes understanding of plan of care.  Vitals:   02/06/24 1506  BP: (!) 163/106  Pulse: 91  Resp: 18  Temp: 98.6 F (37 C)  SpO2: 98%

## 2024-04-18 ENCOUNTER — Ambulatory Visit: Payer: Self-pay

## 2024-04-22 ENCOUNTER — Ambulatory Visit: Payer: Medicare Other | Admitting: Adult Health

## 2024-04-22 VITALS — BP 122/82 | HR 91 | Ht 63.0 in | Wt 174.0 lb

## 2024-04-22 DIAGNOSIS — G35 Multiple sclerosis: Secondary | ICD-10-CM

## 2024-04-22 NOTE — Progress Notes (Signed)
 PATIENT: Joyce Bennett DOB: 11/20/1956  REASON FOR VISIT: follow up HISTORY FROM: patient  Chief Complaint  Patient presents with   Follow-up    Rm 19, alone.  Taking Abx 2nd day for injectible infection. (2nd time).       HISTORY OF PRESENT ILLNESS: Today 04/22/24:  Joyce Bennett is a 68 y.o. female with a history of multiple sclerosis. Returns today for follow-up.  She saw Dr. Gracie Lav at the last visit.  She was willing to switch to another medication as she is tired of injecting herself.  However she never started Aubagio due to the side effect of hair loss.  She has remained on Betaseron .  She states that she has done 2 injections in the abdomen that got infected.  She is on medication for those.  She is now injecting in different areas with no additional infections.  She reports that her symptoms are stable.  No new numbness or weakness.  No change in bowels or bladder.  She reports that her balance is a little off.  She had a fall 3 to 4 months ago walking out of [facility].  She states that she had an abrasion above the left eyebrow and cracked her tooth.  She did go to the emergency room and had CT scan of the face which was unremarkable.  She returns today for an evaluation.  08/22/23: Gracie Lav) Joyce Bennett is a 68 year old female, follow-up for relapsing remitting multiple sclerosis, she was patient of Dr. Tilda Fogo in the past, followed by nurse practitioner Jacqlyn Matas for many years, her primary care physician is from [place], Mal[facility] Search   I reviewed and summarized the referring note. PMHX Left breast cancer, s/p lobectomy, radiation therapy. RRMS   She just retired from driving schoolbus for 17 years, functioning very well, no gait abnormality,   Relapsing remitting multiple sclerosis was diagnosed in her early 48s, presenting with 1 vision loss, recovered with IV steroid, diagnosis was confirmed by abnormal MRI of the brain, personally reviewed most recent MRI of the brain  with and without contrast from August 2023, compared to multiple previous scans, periventricular T2/FLAIR hyperdensity, oval-shaped, consistent with her diagnosis of multiple sclerosis, lesion burden overall has no significant change   At the beginning of the disease, she has some lower extremity paresthesia, but never had gait abnormality, had a frequent urinary tract infection, due to bladder spasticity, receiving intermittent Trimpex  treatment from urology,   She denies other significant relapsing remitting episodes, no MRI spine information found in system   She was started on Betaseron  around 2000,  Because she has been doing so well, so stable, tired of needle injection, subcutaneous lipoatrophy, wants to consider stop the medications,   11/06/22: Joyce Bennett is a 68 year old female with a history of multiple sclerosis.  She returns today for follow-up.  Overall she feels that her symptoms have remained stable.  No changes in her gait or balance.  No changes with vision.  No changes with bowels or bladder.  No new numbness or weakness.  Remains on Betaseron .  Did repeat MRI of the brain in August with results following.  Also had blood work completed in August which I reviewed in epic  MRI BRAIN W/O contrast 07/28/2022: IMPRESSION: 1. No acute intracranial pathology. 2. Periventricular FLAIR hyperintensity consistent with multiple sclerosis. The burden of disease is overall not significantly changed since 2020, with no new or enhancing lesions to suggest active demyelination.  04/23/22: Joyce Bennett is  a 69 year old female with a history of Multiple sclerosis. She returns today for follow-up. Reports that symptoms have been stable. Remains on Betaseron . Didn't repeat MRI as ordered at the last visit. Denies any changes with gait or balance. No changes with vision. No change with bowels or bladder. Returns today for follow-up.  10/27/21: Joyce Bennett is a 68 year old female with a history  of multiple sclerosis.  She returns today for follow-up.  Overall she feels that she is doing well.  Denies any changes with her gait or balance.  Denies any new numbness or weakness.  No changes with her vision.  No changes with the bowels or bladder.  She continues on Betaseron .  She states that she is getting radiation for stage I breast cancer.   She returns today for an evaluation.  04/16/21: Joyce Bennett is a 68 year old female with a history of multiple sclerosis.  She returns today for follow-up.  Overall she feels that she has been doing well.  She denies any new symptoms.  No new numbness or tingling.  No changes with her gait or balance.  No changes with the bowels or bladder.  No visual changes.  She remains on Betaseron .  She returns today for an evaluation.  HISTORY 06/18/19:   Joyce Bennett is a 68 year old female with a history of multiple sclerosis.  She returns today for follow-up.  She remains on Betaseron .  She denies any new symptoms.  No changes with her gait or balance.  No changes with her bowels or bladder.  No new numbness or weakness.  No changes with her vision.  Her last MRI of the brain was in 2017.  She joins me today for follow-up.  REVIEW OF SYSTEMS: Out of a complete 14 system review of symptoms, the patient complains only of the following symptoms, and all other reviewed systems are negative.  See HPI  ALLERGIES: Allergies  Allergen Reactions   Keflex [Cephalexin]    Shellfish Allergy Swelling    Pt. Still consumes shellfish and products containing shelfish    HOME MEDICATIONS: Outpatient Medications Prior to Visit  Medication Sig Dispense Refill   CALCIUM PO Take by mouth daily.     doxycycline  (VIBRAMYCIN ) 100 MG capsule Take 100 mg by mouth 2 (two) times daily.     exemestane  (AROMASIN ) 25 MG tablet Take 1 tablet (25 mg total) by mouth daily after breakfast. 90 tablet 3   fluticasone  (FLONASE ) 50 MCG/ACT nasal spray Place 2 sprays into both nostrils  daily. 16 mL 0   gabapentin  (NEURONTIN ) 300 MG capsule Take 2 capsules (600 mg total) by mouth at bedtime as needed. 60 capsule 11   Interferon Beta-1b  (BETASERON ) 0.3 MG KIT injection ADD 1.2 ML DILUENT TO VIAL AND MIX GENTLY. INJECT 1 ML UNDER THE SKIN EVERY OTHER DAY 42 kit 3   levocetirizine (XYZAL) 5 MG tablet Take 5 mg by mouth every evening.     Multiple Vitamin (MULTIVITAMIN) tablet Take 1 tablet by mouth daily.     Multiple Vitamins-Minerals (ZINC PO) Take 1 tablet by mouth daily.     olopatadine (PATANOL) 0.1 % ophthalmic solution Place 1 drop into both eyes 2 (two) times daily.     trimethoprim  (TRIMPEX ) 100 MG tablet Take 1 tablet (100 mg total) by mouth at bedtime as needed. (Patient not taking: Reported on 04/22/2024)     cetirizine  (ZYRTEC  ALLERGY) 10 MG tablet Take 1 tablet (10 mg total) by mouth daily for 10 days. 10 tablet  0   Facility-Administered Medications Prior to Visit  Medication Dose Route Frequency Provider Last Rate Last Admin   gadopentetate dimeglumine  (MAGNEVIST ) injection 15 mL  15 mL Intravenous Once PRN Brian Campanile, MD        PAST MEDICAL HISTORY: Past Medical History:  Diagnosis Date   Breast cancer [facility])    Family history of breast cancer 08/10/2021   Family history of prostate cancer 08/10/2021   History of radiation therapy    left breast - 10/16/2021 - 11/17/2021 - Dr Retta Caster   Multiple sclerosis [facility])    Neurogenic bladder    Optic neuritis, left    Personal history of radiation therapy    Restless leg syndrome    Urinary tract infection    Recurrent    PAST SURGICAL HISTORY: Past Surgical History:  Procedure Laterality Date   BREAST LUMPECTOMY Left    08-2021   BREAST LUMPECTOMY WITH RADIOACTIVE SEED AND SENTINEL LYMPH NODE BIOPSY Left 09/01/2021   Procedure: LEFT BREAST LUMPECTOMY WITH RADIOACTIVE SEED AND SENTINEL LYMPH NODE BIOPSY;  Surgeon: Caralyn Chandler, MD;  Location: [facility];  Service: General;   Laterality: Left;   FOOT SURGERY N/A    HAND SURGERY Right     FAMILY HISTORY: Family History  Problem Relation Age of Onset   Stomach cancer Sister        dx 48s   Breast cancer Maternal Aunt        dx after 12, x2 maternal aunts   Prostate cancer Maternal Uncle        dx 60s   Heart disease Maternal Grandmother    Breast cancer Paternal Grandmother        dx after 50   Multiple sclerosis Neg Hx     SOCIAL HISTORY: Social History   Socioeconomic History   Marital status: Married    Spouse name: Not on file   Number of children: 1   Years of education: ASSOCIATE   Highest education level: Not on file  Occupational History   Occupation: Facilities manager: [employer]  Tobacco Use   Smoking status: Never   Smokeless tobacco: Never  Substance and Sexual Activity   Alcohol use: Yes    Comment: rare   Drug use: No   Sexual activity: Not on file  Other Topics Concern   Not on file  Social History Narrative   Patient is married with 1 child.Patient is right handed.Patient has a Associate's degree.  4 cups a Week of Tea   Social Drivers of Health   Financial Resource Strain: Low Risk  (01/08/2024)   Received from [facility]   Overall Financial Resource Strain (CARDIA)    Difficulty of Paying Living Expenses: Not hard at all  Food Insecurity: No Food Insecurity (01/08/2024)   [facility]   Hunger Vital Sign    Worried About Running Out of Food in the Last Year: Never true    Ran Out of Food in the Last Year: Never true  Transportation Needs: No Transportation Needs (01/08/2024)   Received from [facility] - Transportation    Lack of Transportation (Medical): No    Lack of Transportation (Non-Medical): No  Physical Activity: Sufficiently Active (09/13/2023)   Received from [facility]   Exercise Vital Sign    Days of Exercise per Week: 5 days    Minutes of Exercise per Session: 40 min  Stress: No Stress  Concern  Present (09/13/2023)   Received from [facility] of Occupational Health - Occupational Stress Questionnaire    Feeling of Stress : Only a little  Social Connections: Socially Integrated (09/13/2023)   Received from [facility]   Social Network    How would you rate your social network (family, work, friends)?: Good participation with social networks  Intimate Partner Violence: Not At Risk (09/13/2023)   Received from [facility]   HITS    Over the last 12 months how often did your partner physically hurt you?: Never    Over the last 12 months how often did your partner insult you or talk down to you?: Never    Over the last 12 months how often did your partner threaten you with physical harm?: Never    Over the last 12 months how often did your partner scream or curse at you?: Never      PHYSICAL EXAM  Vitals:   04/22/24 1057  BP: 122/82  Pulse: 91  Weight: 174 lb (78.9 kg)  Height: 5\' 3"  (1.6 m)     Body mass index is 30.82 kg/m.  Generalized: Well developed, in no acute distress   Neurological examination  Mentation: Alert oriented to time, place, history taking. Follows all commands speech and language fluent Cranial nerve II-XII: Pupils were equal round reactive to light. Extraocular movements were full, visual field were full on confrontational test. Facial sensation and strength were normal. Head turning and shoulder shrug  were normal and symmetric. Motor: The motor testing reveals 5 over 5 strength of all 4 extremities. Good symmetric motor tone is noted throughout.  Sensory: Sensory testing is intact to soft touch on all 4 extremities. No evidence of extinction is noted.  Coordination: Cerebellar testing reveals good finger-nose-finger and heel-to-shin bilaterally.  Gait and station: Gait is normal.    DIAGNOSTIC DATA (LABS, IMAGING, TESTING) - I reviewed patient records, labs, notes, testing and imaging myself where available.  Lab  Results  Component Value Date   WBC 4.2 08/27/2023   HGB 11.8 08/27/2023   HCT 37.3 08/27/2023   MCV 89 08/27/2023   PLT 361 08/27/2023      Component Value Date/Time   NA 144 08/27/2023 1048   K 4.4 08/27/2023 1048   CL 104 08/27/2023 1048   CO2 26 08/27/2023 1048   GLUCOSE 86 08/27/2023 1048   GLUCOSE 98 07/28/2022 0129   BUN 15 08/27/2023 1048   CREATININE 1.13 (H) 08/27/2023 1048   CREATININE 0.84 08/09/2021 1243   CALCIUM 9.7 08/27/2023 1048   PROT 7.3 08/27/2023 1048   ALBUMIN 4.3 08/27/2023 1048   AST 23 08/27/2023 1048   AST 24 08/09/2021 1243   ALT 14 08/27/2023 1048   ALT 13 08/09/2021 1243   ALKPHOS 183 (H) 08/27/2023 1048   BILITOT 0.3 08/27/2023 1048   BILITOT 0.3 08/09/2021 1243   GFRNONAA 52 (L) 07/28/2022 0129   GFRNONAA >60 08/09/2021 1243   GFRAA 96 06/22/2020 1319      ASSESSMENT AND PLAN 68 y.o. year old female  has a past medical history of Breast cancer (HCC), Family history of breast cancer (08/10/2021), Family history of prostate cancer (08/10/2021), History of radiation therapy, Multiple sclerosis (HCC), Neurogenic bladder, Optic neuritis, left, Personal history of radiation therapy, Restless leg syndrome, and Urinary tract infection. here with:  Multiple sclerosis  Stable Continue Betaseron - advised patient I would discuss medication options with Dr. Gracie Lav Will consider repeating images at  the next visit Will we order blood work once we decide if we are switching therapies.   Follow-up in 6 months or sooner if needed    Clem Currier, MSN, NP-C 04/22/2024, 10:56 AM [facility] [address], [address] [place], [place] [address] [phone]

## 2024-04-22 NOTE — Patient Instructions (Signed)
 Your Plan:  Continue Betaseron   I will discuss medication options with Dr. Gracie Lav    Thank you for coming to see us  at [facility]. I hope we have been able to provide you high quality care today.  You may receive a patient satisfaction survey over the next few weeks. We would appreciate your feedback and comments so that we may continue to improve ourselves and the health of our patients.

## 2024-04-27 ENCOUNTER — Telehealth: Payer: Self-pay | Admitting: Adult Health

## 2024-04-27 NOTE — Telephone Encounter (Signed)
 I called pt and told her that Zona Hippo is one (has the least side effects).  Tecfidera is the other.    She will research and let us  know.

## 2024-04-27 NOTE — Telephone Encounter (Signed)
-----   Message from Phebe Brasil sent at 04/27/2024 11:26 AM EDT ----- Out of oral choices, Aubagio has the least side effect, other option is Tecfidera. ----- Message ----- From: Clem Currier, NP Sent: 04/22/2024   1:43 PM EDT To: Phebe Brasil, MD  Patient is still on betaseron - you tried to switch her to Aubagio but she did not want to try this due to side effects.  She would like to switch to an oral medication.  What are your thoughts?  Tecfidera,gilenya?

## 2024-04-27 NOTE — Telephone Encounter (Signed)
 I  called and LMVM for pt (to return call ).  Aubagio or tecfidera are other options (oral) for her.

## 2024-04-27 NOTE — Telephone Encounter (Addendum)
 Pt has returned call to [place], [place]

## 2024-04-27 NOTE — Telephone Encounter (Signed)
 Please call patient and let her know Dr. Torrance Freestone response below.  If she is interested in Tecfidera please go over with her side effects.  We can also send her information on her MyChart

## 2024-05-20 ENCOUNTER — Ambulatory Visit
Admission: RE | Admit: 2024-05-20 | Discharge: 2024-05-20 | Disposition: A | Discharge status: Home / Self Care | Source: Ambulatory Visit | Attending: Hematology and Oncology | Admitting: Hematology and Oncology

## 2024-05-20 DIAGNOSIS — Z9889 Other specified postprocedural states: Secondary | ICD-10-CM

## 2024-05-21 NOTE — Telephone Encounter (Signed)
 I called the pt to follow-up since we had not heard back from her. Although patient does not like that her current medication is a shot, she feels she is stable and does not wish to change at this time. The patient verbalized appreciation for the call back. She will let us  know if she changes her mind and/or feels her symptoms worsen on Betaseron .

## 2024-07-17 ENCOUNTER — Ambulatory Visit
Admission: RE | Admit: 2024-07-17 | Discharge: 2024-07-17 | Disposition: A | Discharge status: Home / Self Care | Source: Ambulatory Visit | Attending: Family Medicine | Admitting: Family Medicine

## 2024-07-17 VITALS — BP 133/90 | HR 103 | Temp 98.3°F | Resp 18 | Wt 173.9 lb

## 2024-07-17 DIAGNOSIS — B369 Superficial mycosis, unspecified: Secondary | ICD-10-CM

## 2024-07-17 MED ORDER — KETOCONAZOLE 2 % EX CREA: 1.0000 | TOPICAL_CREAM | Freq: Every day | CUTANEOUS | 0 refills | Status: DC

## 2024-07-17 MED ORDER — FLUCONAZOLE 150 MG PO TABS
150.0000 mg | ORAL_TABLET | Freq: Once | ORAL | 0 refills | Status: AC
Start: 2024-07-17 — End: 2024-07-17

## 2024-07-17 NOTE — ED Triage Notes (Signed)
 Itching at my nable - Entered by patient  Pt presents c/o abd pain x 3 days. Pt reports using Triamcinolone  topical cream to sooth the itching but it has not helped. Pt denies contact with any known allergens.

## 2024-07-17 NOTE — ED Provider Notes (Signed)
 Joyce Bennett - [facility]  Note:  This document was prepared using Conservation officer, historic buildings and may include unintentional dictation errors.  MRN: [id number] DOB: December 09, 1956  Subjective:   Joyce Bennett is a 68 y.o. female presenting for 3-day history of persistent itching and irritation about the navel area.  Patient started using triamcinolone  and has made it worse.  She was taken amoxicillin  earlier this week but stopped it due to feel like she was having too many side effects from it.  She nearly finished the treatment, had only 3 pills left.  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  No fever, nausea, vomiting, drainage of pus or bleeding.  No current facility-administered medications for this encounter.  Current Outpatient Medications:    CALCIUM PO, Take by mouth daily., Disp: , Rfl:    exemestane  (AROMASIN ) 25 MG tablet, Take 1 tablet (25 mg total) by mouth daily after breakfast., Disp: 90 tablet, Rfl: 3   fluticasone  (FLONASE ) 50 MCG/ACT nasal spray, Place 2 sprays into both nostrils daily., Disp: 16 mL, Rfl: 0   gabapentin  (NEURONTIN ) 300 MG capsule, Take 2 capsules (600 mg total) by mouth at bedtime as needed. (Patient not taking: Reported on 04/22/2024), Disp: 60 capsule, Rfl: 11   Interferon Beta-1b  (BETASERON ) 0.3 MG KIT injection, ADD 1.2 ML DILUENT TO VIAL AND MIX GENTLY. INJECT 1 ML UNDER THE SKIN EVERY OTHER DAY, Disp: 42 kit, Rfl: 3   levocetirizine (XYZAL) 5 MG tablet, Take 5 mg by mouth every evening., Disp: , Rfl:    Multiple Vitamin (MULTIVITAMIN) tablet, Take 1 tablet by mouth daily., Disp: , Rfl:    Multiple Vitamins-Minerals (ZINC PO), Take 1 tablet by mouth daily., Disp: , Rfl:    olopatadine (PATANOL) 0.1 % ophthalmic solution, Place 1 drop into both eyes 2 (two) times daily., Disp: , Rfl:    trimethoprim  (TRIMPEX ) 100 MG tablet, Take 1 tablet (100 mg total) by mouth at bedtime as needed. (Patient not taking:  Reported on 04/22/2024), Disp: , Rfl:   Facility-Administered Medications Ordered in Other Encounters:    gadopentetate dimeglumine  (MAGNEVIST ) injection 15 mL, 15 mL, Intravenous, Once PRN, Jenel Carlin POUR, MD   Allergies  Allergen Reactions   Keflex [Cephalexin]    Shellfish Allergy Swelling    Pt. Still consumes shellfish and products containing shelfish    Past Medical History:  Diagnosis Date   Breast cancer (HCC)    Family history of breast cancer 08/10/2021   Family history of prostate cancer 08/10/2021   History of radiation therapy    left breast - 10/16/2021 - 11/17/2021 - Dr Lynwood Nasuti   Multiple sclerosis [facility])    Neurogenic bladder    Optic neuritis, left    Personal history of radiation therapy    Restless leg syndrome    Urinary tract infection    Recurrent     Past Surgical History:  Procedure Laterality Date   BREAST LUMPECTOMY Left    08-2021   BREAST LUMPECTOMY WITH RADIOACTIVE SEED AND SENTINEL LYMPH NODE BIOPSY Left 09/01/2021   Procedure: LEFT BREAST LUMPECTOMY WITH RADIOACTIVE SEED AND SENTINEL LYMPH NODE BIOPSY;  Surgeon: Curvin Deward MOULD, MD;  Location: [facility];  Service: General;  Laterality: Left;   FOOT SURGERY N/A    HAND SURGERY Right     Family History  Problem Relation Age of Onset   Stomach cancer Sister        dx 13s   Breast  cancer Maternal Aunt        dx after 2, x2 maternal aunts   Prostate cancer Maternal Uncle        dx 60s   Heart disease Maternal Grandmother    Breast cancer Paternal Grandmother        dx after 71   Multiple sclerosis Neg Hx     Social History   Tobacco Use   Smoking status: Never   Smokeless tobacco: Never  Substance Use Topics   Alcohol use: Yes    Comment: rare   Drug use: No    ROS   Objective:   Vitals: BP (!) 133/90 (BP Location: Left Arm)   Pulse (!) 103   Temp 98.3 F (36.8 C) (Oral)   Resp 18   Wt 173 lb 15.1 oz (78.9 kg)   SpO2 97%   BMI 30.81 kg/m    Physical Exam Constitutional:      General: She is not in acute distress.    Appearance: Normal appearance. She is well-developed. She is not ill-appearing, toxic-appearing or diaphoretic.  HENT:     Head: Normocephalic and atraumatic.     Nose: Nose normal.     Mouth/Throat:     Mouth: Mucous membranes are moist.     Pharynx: Oropharynx is clear.  Eyes:     General: No scleral icterus.       Right eye: No discharge.        Left eye: No discharge.     Extraocular Movements: Extraocular movements intact.     Conjunctiva/sclera: Conjunctivae normal.  Cardiovascular:     Rate and Rhythm: Normal rate.  Pulmonary:     Effort: Pulmonary effort is normal.  Abdominal:     General: Bowel sounds are normal. There is no distension.     Palpations: Abdomen is soft. There is no mass.     Tenderness: There is no abdominal tenderness. There is no right CVA tenderness, left CVA tenderness, guarding or rebound.   Skin:    General: Skin is warm and dry.  Neurological:     General: No focal deficit present.     Mental Status: She is alert and oriented to person, place, and time.  Psychiatric:        Mood and Affect: Mood normal.        Behavior: Behavior normal.        Thought Content: Thought content normal.        Judgment: Judgment normal.     Assessment and Plan :   PDMP not reviewed this encounter.  1. Fungal infection of skin of abdomen    Recommended managing for secondary fungal infection from the use of antibiotics.  Use a loading dose of fluconazole , follow this with ketoconazole  cream.  Counseled patient on potential for adverse effects with medications prescribed/recommended today, ER and return-to-clinic precautions discussed, patient verbalized understanding.    Joyce Bennett, [place] 07/17/24 8485

## 2024-07-17 NOTE — Discharge Instructions (Signed)
 Take fluconazole  today. Start ketoconazole  cream for the next 2 weeks to help against a suspected fungal infection from your recent antibiotic use.

## 2024-07-20 ENCOUNTER — Ambulatory Visit: Payer: Medicare HMO | Attending: General Surgery

## 2024-07-20 VITALS — Wt 173.0 lb

## 2024-07-20 DIAGNOSIS — Z483 Aftercare following surgery for neoplasm: Secondary | ICD-10-CM | POA: Insufficient documentation

## 2024-07-20 NOTE — Therapy (Signed)
 OUTPATIENT PHYSICAL THERAPY SOZO SCREENING NOTE   Patient Name: Joyce Bennett MRN: [id number] DOB:[birth year], 68 y.o., female Today's Date: 07/20/2024  PCP: Gladystine Erminio CROME, MD REFERRING PROVIDER: Curvin Deward MOULD, MD   PT End of Session - 07/20/24 1034     Visit Number 3   # unchanged due to screen only   PT Start Time 1032    PT Stop Time 1036    PT Time Calculation (min) 4 min    Activity Tolerance Patient tolerated treatment well    Behavior During Therapy [facility] for tasks assessed/performed           Past Medical History:  Diagnosis Date   Breast cancer (HCC)    Family history of breast cancer 08/10/2021   Family history of prostate cancer 08/10/2021   History of radiation therapy    left breast - 10/16/2021 - 11/17/2021 - Dr Lynwood Nasuti   Multiple sclerosis [facility])    Neurogenic bladder    Optic neuritis, left    Personal history of radiation therapy    Restless leg syndrome    Urinary tract infection    Recurrent   Past Surgical History:  Procedure Laterality Date   BREAST LUMPECTOMY Left    08-2021   BREAST LUMPECTOMY WITH RADIOACTIVE SEED AND SENTINEL LYMPH NODE BIOPSY Left 09/01/2021   Procedure: LEFT BREAST LUMPECTOMY WITH RADIOACTIVE SEED AND SENTINEL LYMPH NODE BIOPSY;  Surgeon: Curvin Deward MOULD, MD;  Location: [facility];  Service: General;  Laterality: Left;   FOOT SURGERY N/A    HAND SURGERY Right    Patient Active Problem List   Diagnosis Date Noted   Genetic testing 08/24/2021   Family history of breast cancer 08/10/2021   Family history of prostate cancer 08/10/2021   Malignant neoplasm of lower-outer quadrant of left breast of female, estrogen receptor positive (HCC) 08/03/2021   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 She underwent a left lumpectomy and sentinel node biopsy (4 negative nodes) on 09/01/2021. It is ER positive, PR negative, and HER2 negative with a Ki67 of 50%. She had right hand surgery 40 years ago with hardware placed but it has since been removed  PRECAUTIONS: left UE Lymphedema risk, None  SUBJECTIVE: Pt returns for her 6 month L-Dex screen.   PAIN:  Are you having pain? No  SOZO SCREENING: Patient was assessed today using the SOZO machine to determine the lymphedema index score. This was compared to her baseline score. It was determined that she is within the recommended range when compared to her baseline and no further action is needed at this time. She will continue SOZO screenings. These are done every 3 months for 2 years post operatively followed by every 6 months for 2 years, and then annually.   L-DEX FLOWSHEETS - 07/20/24 1000       L-DEX LYMPHEDEMA SCREENING   Measurement Type Unilateral    L-DEX MEASUREMENT EXTREMITY Upper Extremity    POSITION  Standing    DOMINANT SIDE Right    At Risk Side Left    BASELINE SCORE (UNILATERAL) 4.5    L-DEX SCORE (UNILATERAL) -1.3    VALUE CHANGE (UNILAT) -5.8  P: 6 month L-Dex screens next.   Aden Berwyn Caldron, PTA 07/20/2024, 10:35 AM

## 2024-10-19 ENCOUNTER — Telehealth: Payer: Self-pay | Admitting: *Deleted

## 2024-10-19 MED ORDER — BETASERON 0.3 MG ~~LOC~~ KIT: PACK | SUBCUTANEOUS | 3 refills | Status: AC

## 2024-10-19 NOTE — Telephone Encounter (Signed)
 Received a fax from [employer]  patient assistance foundation stating patient is eligible to receive Betaseron  at no cost from 12/10/24 to 12/09/2025. Case number: [id number]. The foundation is also asking for a new prescription by 12/09/24. Rx sent to [facility].

## 2024-11-04 ENCOUNTER — Inpatient Hospital Stay: Payer: Medicare HMO | Attending: Hematology and Oncology | Admitting: Hematology and Oncology

## 2024-11-04 VITALS — BP 112/78 | HR 96 | Temp 98.3°F | Resp 16 | Ht 63.0 in | Wt 165.6 lb

## 2024-11-04 DIAGNOSIS — C50512 Malignant neoplasm of lower-outer quadrant of left female breast: Secondary | ICD-10-CM | POA: Diagnosis not present

## 2024-11-04 DIAGNOSIS — Z780 Asymptomatic menopausal state: Secondary | ICD-10-CM

## 2024-11-04 DIAGNOSIS — Z170 Estrogen receptor positive status [ER+]: Secondary | ICD-10-CM

## 2024-11-04 MED ORDER — EXEMESTANE 25 MG PO TABS: 25.0000 mg | ORAL_TABLET | Freq: Every day | ORAL | 3 refills | Status: AC

## 2024-11-04 NOTE — Assessment & Plan Note (Signed)
 09/01/2021:Left lumpectomy: Focal residual DCIS high-grade, no residual invasive ductal carcinoma, resection margins negative, 0/4 lymph nodes negative, tumor size: 0.1 cm, ER 95%, PR 0%, HER2 1+, Ki-67 50%   Treatment plan: 1.  Given the small size of the invasive ductal carcinoma there is no role of Oncotype DX testing. 2. adjuvant radiation therapy completed 11/17/21 3.  Adjuvant antiestrogens with Letrozole  for 5 years started 11/27/2021   Letrozole  toxicities: Tolerating antiestrogen therapy fairly well   Breast cancer surveillance: 1.  Breast exam 11/04/2024: Benign 2. mammogram 05/20/2024: Benign breast density category B Brain MRI 07/28/2022: Periventricular FLAIR hyperintensity consistent with multiple sclerosis unchanged since 2020   Return to clinic in 1 year for follow-up

## 2024-11-04 NOTE — Progress Notes (Signed)
 Patient Care Team: Gladystine Erminio CROME, MD as PCP - General (Family Medicine) Curvin Deward MOULD, MD as Consulting Physician (General Surgery) Odean Potts, MD as Consulting Physician (Hematology and Oncology) Shannon Agent, MD as Consulting Physician (Radiation Oncology) Latisha Medford, MD as Consulting Physician (Obstetrics and Gynecology)  DIAGNOSIS:  Encounter Diagnoses  Name Primary?   Malignant neoplasm of lower-outer quadrant of left breast of female, estrogen receptor positive (HCC) Yes   Post-menopausal     SUMMARY OF ONCOLOGIC HISTORY: Oncology History  Malignant neoplasm of lower-outer quadrant of left breast of female, estrogen receptor positive (HCC)  08/03/2021 Initial Diagnosis   Left diagnostic mammogram: suspicious calcifications in the lower outer quadrant of the left breast. 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    08/09/2021 Cancer Staging   Staging form: Breast, AJCC 8th Edition - Clinical stage from 08/09/2021: Stage IA (cT51mi, cN0, cM0, G2, ER+, PR-, HER2-) - Signed by Odean Potts, MD on 08/09/2021 Stage prefix: Initial diagnosis Histologic grading system: 3 grade system   08/18/2021 Genetic Testing   Negative hereditary cancer genetic testing: no pathogenic variants detected in [facility] BRCAPlus Panel or [facility] CancerNext-Expanded +RNAinsight Panel.  The report dates are August 18, 2021 and August 25, 2021, respectively.   The BRCAplus panel offered by [employer] and includes sequencing and deletion/duplication analysis for the following 8 genes: ATM, BRCA1, BRCA2, CDH1, CHEK2, PALB2, PTEN, and TP53.  The CancerNext-Expanded gene panel offered by [facility] and includes sequencing, rearrangement, and RNA analysis for the following 77 genes: AIP, ALK, APC, ATM, AXIN2, BAP1, BARD1, BLM, BMPR1A, BRCA1, BRCA2, BRIP1, CDC73, CDH1, CDK4, CDKN1B, CDKN2A, CHEK2, CTNNA1, DICER1, FANCC, FH, FLCN, GALNT12, KIF1B, LZTR1,  MAX, MEN1, MET, MLH1, MSH2, MSH3, MSH6, MUTYH, NBN, NF1, NF2, NTHL1, PALB2, PHOX2B, PMS2, POT1, PRKAR1A, PTCH1, PTEN, RAD51C, RAD51D, RB1, RECQL, RET, SDHA, SDHAF2, SDHB, SDHC, SDHD, SMAD4, SMARCA4, SMARCB1, SMARCE1, STK11, SUFU, TMEM127, TP53, TSC1, TSC2, VHL and XRCC2 (sequencing and deletion/duplication); EGFR, EGLN1, HOXB13, KIT, MITF, PDGFRA, POLD1, and POLE (sequencing only); EPCAM and GREM1 (deletion/duplication only).    09/01/2021 Surgery   Left lumpectomy: Focal residual DCIS high-grade, no residual invasive ductal carcinoma, resection margins negative, 0/4 lymph nodes negative, tumor size: 0.1 cm, ER 95%, PR 0%, HER2 1+, Ki-67 50%   10/16/2021 - 11/17/2021 Radiation Therapy   Site Technique Total Dose (Gy) Dose per Fx (Gy) Completed Fx Beam Energies  Breast, Left: Breast_Lt 3D 40.05/40.05 2.67 15/15 10X  Breast, Left: Breast_Lt_Bst 3D 12/12 2 6/6 6X, 10X     11/27/2021 -  Anti-estrogen oral therapy   Started with anastrozole , developed bilateral eye irritation. Switched to exemestane  02/08/2022.     CHIEF COMPLIANT: Follow-up on exemestane  therapy  HISTORY OF PRESENT ILLNESS:  History of Present Illness Joyce Bennett is a 68 year old female with breast cancer who presents for follow-up regarding her treatment regimen.  She is on exemestane  and tolerates it well without significant hot flashes or joint stiffness. She prefers this compared to her prior endocrine therapies.  She has received radiation therapy for breast cancer.  She has recurrent urinary tract infections and uses trimethoprim  as needed, with improved frequency after increasing water intake.  She has no significant hot flashes or joint stiffness on her current medication.     ALLERGIES:  is allergic to keflex [cephalexin] and shellfish allergy.  MEDICATIONS:  Current Outpatient Medications  Medication Sig Dispense Refill   CALCIUM PO Take by mouth daily.     fluticasone  (  FLONASE ) 50 MCG/ACT nasal spray  Place 2 sprays into both nostrils daily. 16 mL 0   Interferon Beta-1b  (BETASERON ) 0.3 MG KIT injection ADD 1.2 ML DILUENT TO VIAL AND MIX GENTLY. INJECT 1 ML UNDER THE SKIN EVERY OTHER DAY 42 kit 3   Multiple Vitamin (MULTIVITAMIN) tablet Take 1 tablet by mouth daily.     Multiple Vitamins-Minerals (ZINC PO) Take 1 tablet by mouth daily.     olopatadine (PATANOL) 0.1 % ophthalmic solution Place 1 drop into both eyes 2 (two) times daily.     exemestane  (AROMASIN ) 25 MG tablet Take 1 tablet (25 mg total) by mouth daily after breakfast. 90 tablet 3   No current facility-administered medications for this visit.   Facility-Administered Medications Ordered in Other Visits  Medication Dose Route Frequency Provider Last Rate Last Admin   gadopentetate dimeglumine  (MAGNEVIST ) injection 15 mL  15 mL Intravenous Once PRN Jenel Carlin POUR, MD        PHYSICAL EXAMINATION: ECOG PERFORMANCE STATUS: 1 - Symptomatic but completely ambulatory  Vitals:   11/04/24 0912  BP: 112/78  Pulse: 96  Resp: 16  Temp: 98.3 F (36.8 C)  SpO2: 100%   Filed Weights   11/04/24 0912  Weight: 165 lb 9.6 oz (75.1 kg)    Physical Exam BREAST: Lymphedema present in left breast.  (exam performed in the presence of a chaperone)  LABORATORY DATA:  I have reviewed the data as listed    Latest Ref Rng & Units 08/27/2023   10:48 AM 07/28/2022    1:29 AM 04/23/2022   11:05 AM  CMP  Glucose 70 - 99 mg/dL 86  98  59   BUN 8 - 27 mg/dL 15  21  12    Creatinine 0.57 - 1.00 mg/dL 8.86  8.83  8.98   Sodium 134 - 144 mmol/L 144  139  142   Potassium 3.5 - 5.2 mmol/L 4.4  4.2  4.7   Chloride 96 - 106 mmol/L 104  106  103   CO2 20 - 29 mmol/L 26  25  25    Calcium 8.7 - 10.3 mg/dL 9.7  9.2  89.5   Total Protein 6.0 - 8.5 g/dL 7.3   7.8   Total Bilirubin 0.0 - 1.2 mg/dL 0.3   0.3   Alkaline Phos 44 - 121 IU/L 183   150   AST 0 - 40 IU/L 23   25   ALT 0 - 32 IU/L 14   13     Lab Results  Component Value Date   WBC  4.2 08/27/2023   HGB 11.8 08/27/2023   HCT 37.3 08/27/2023   MCV 89 08/27/2023   PLT 361 08/27/2023   NEUTROABS 2.3 08/27/2023    ASSESSMENT & PLAN:  Malignant neoplasm of lower-outer quadrant of left breast of female, estrogen receptor positive (HCC) 09/01/2021:Left lumpectomy: Focal residual DCIS high-grade, no residual invasive ductal carcinoma, resection margins negative, 0/4 lymph nodes negative, tumor size: 0.1 cm, ER 95%, PR 0%, HER2 1+, Ki-67 50%   Treatment plan: 1.  Given the small size of the invasive ductal carcinoma there is no role of Oncotype DX testing. 2. adjuvant radiation therapy completed 11/17/21 3.  Adjuvant antiestrogens with Letrozole  for 5 years started 11/27/2021 switch to anastrozole  and then switched to exemestane    Simvastatin toxicities: Tolerating antiestrogen therapy fairly well   Breast cancer surveillance: 1.  Breast exam 11/04/2024: Benign 2. mammogram 05/20/2024: Benign breast density category B Brain MRI 07/28/2022: Periventricular  FLAIR hyperintensity consistent with multiple sclerosis unchanged since 2020 I ordered a bone density to be done in March 2026  Return to clinic in 1 year for follow-up ------------------------------------- Assessment and Plan Assessment & Plan Estrogen receptor positive malignant neoplasm of lower-outer quadrant of left breast, status post radiation and on exemestane  Currently on exemestane  with no significant side effects. Radiation resulted in scar tissue and thickening in the left breast. - Continue exemestane  25 mg oral daily after breakfast. - Ordered bone density [facility] March 2026 [facility].  Lymphedema of left breast and arm Presence of lymphedema with fluid buildup in the left breast and arm. Radiation contributed to scar tissue formation. - Advised massaging breasts and applying lotion, especially after showering or before bed. - Instructed to massage breasts into the arm to help with  lymphedema.      Orders Placed This Encounter  Procedures   DG Bone Density    Standing Status:   Future    Expiration Date:   11/04/2025    Reason for Exam (SYMPTOM  OR DIAGNOSIS REQUIRED):   post menopausal    Preferred imaging location?:   [facility]    Release to patient:   Immediate   The patient has a good understanding of the overall plan. she agrees with it. she will call with any problems that may develop before the next visit here.  I personally spent a total of 30 minutes in the care of the patient today including preparing to see the patient, getting/reviewing separately obtained history, performing a medically appropriate exam/evaluation, counseling and educating, placing orders, referring and communicating with other health care professionals, documenting clinical information in the EHR, independently interpreting results, communicating results, and coordinating care.   Viinay K Cyrus Ramsburg, MD 11/04/24

## 2024-11-26 ENCOUNTER — Encounter (HOSPITAL_BASED_OUTPATIENT_CLINIC_OR_DEPARTMENT_OTHER): Payer: Self-pay

## 2024-11-26 ENCOUNTER — Ambulatory Visit
Admission: EM | Admit: 2024-11-26 | Discharge: 2024-11-26 | Disposition: A | Discharge status: Home / Self Care | Source: Home / Self Care

## 2024-11-26 ENCOUNTER — Emergency Department (HOSPITAL_BASED_OUTPATIENT_CLINIC_OR_DEPARTMENT_OTHER): Admission: EM | Admit: 2024-11-26 | Source: Home / Self Care

## 2024-11-26 DIAGNOSIS — R040 Epistaxis: Secondary | ICD-10-CM | POA: Diagnosis not present

## 2024-11-26 DIAGNOSIS — Z853 Personal history of malignant neoplasm of breast: Secondary | ICD-10-CM | POA: Diagnosis not present

## 2024-11-26 NOTE — Discharge Instructions (Signed)
 Use humidifier nightly  Do not use Afrin more than 3 days in a row  May use a small bit of vaseline to moisturize inside of nose  If this happens again, lean forward and apply continuous pressure for 15 mins, if symptoms do not improve after 15 mins that is a reason to come in.

## 2024-11-26 NOTE — ED Provider Notes (Signed)
 [facility]    CSN: [id number] Arrival date & time: 11/26/24  0900      History   Chief Complaint Chief Complaint  Patient presents with   Epistaxis    HPI Joyce Bennett is a 68 y.o. female.   Pt presents today due to epistaxis that started this morning after blowing her nose. Pt states that she had the same symptoms yesterday, called EMS and they were able to stop her nosebleed. Pt denies use of blood thinners.   The history is provided by the patient.  Epistaxis   Past Medical History:  Diagnosis Date   Breast cancer (HCC)    Family history of breast cancer 08/10/2021   Family history of prostate cancer 08/10/2021   History of radiation therapy    left breast - 10/16/2021 - 11/17/2021 - Dr Lynwood Nasuti   Multiple sclerosis    Neurogenic bladder    Optic neuritis, left    Personal history of radiation therapy    Restless leg syndrome    Urinary tract infection    Recurrent    Patient Active Problem List   Diagnosis Date Noted   Genetic testing 08/24/2021   Family history of breast cancer 08/10/2021   Family history of prostate cancer 08/10/2021   Malignant neoplasm of lower-outer quadrant of left breast of female, estrogen receptor positive (HCC) 08/03/2021   Immunodeficiency due to drugs 12/06/2020   Relapsing remitting multiple sclerosis 07/28/2012    Past Surgical History:  Procedure Laterality Date   BREAST LUMPECTOMY Left    08-2021   BREAST LUMPECTOMY WITH RADIOACTIVE SEED AND SENTINEL LYMPH NODE BIOPSY Left 09/01/2021   Procedure: LEFT BREAST LUMPECTOMY WITH RADIOACTIVE SEED AND SENTINEL LYMPH NODE BIOPSY;  Surgeon: Curvin Deward MOULD, MD;  Location: [facility];  Service: General;  Laterality: Left;   FOOT SURGERY N/A    HAND SURGERY Right     OB History   No obstetric history on file.      Home Medications    Prior to Admission medications  Medication Sig Start Date End Date Taking? Authorizing Provider   azelastine (OPTIVAR) 0.05 % ophthalmic solution Place 1 drop into both eyes 2 (two) times daily. 11/11/24  Yes [provider]  CALCIUM PO Take by mouth daily.   Yes [provider]  estradiol (ESTRACE) 0.01 % CREA vaginal cream Place 1 Applicatorful vaginally.   Yes [provider]  exemestane  (AROMASIN ) 25 MG tablet Take 1 tablet (25 mg total) by mouth daily after breakfast. 11/04/24  Yes Odean Potts, MD  fluticasone  (FLONASE ) 50 MCG/ACT nasal spray Place 2 sprays into both nostrils daily. 07/27/22  Yes Palumbo, April, MD  Interferon Beta-1b  (BETASERON ) 0.3 MG KIT injection ADD 1.2 ML DILUENT TO VIAL AND MIX GENTLY. INJECT 1 ML UNDER THE SKIN EVERY OTHER DAY 10/19/24  Yes Millikan, Megan, NP  Multiple Vitamin (MULTIVITAMIN) tablet Take 1 tablet by mouth daily.   Yes [provider]  Multiple Vitamins-Minerals (ZINC PO) Take 1 tablet by mouth daily.   Yes [provider]  olopatadine (PATANOL) 0.1 % ophthalmic solution Place 1 drop into both eyes 2 (two) times daily.   Yes [provider]  trimethoprim  (TRIMPEX ) 100 MG tablet Take 100 mg by mouth at bedtime. 11/10/24  Yes [provider]    Family History Family History  Problem Relation Age of Onset   Stomach cancer Sister        dx 34s   Breast cancer  Maternal Aunt        dx after 60, x2 maternal aunts   Prostate cancer Maternal Uncle        dx 60s   Heart disease Maternal Grandmother    Breast cancer Paternal Grandmother        dx after 71   Multiple sclerosis Neg Hx     Social History Social History[1]   Allergies   Keflex [cephalexin], Shellfish allergy, Sulfamethoxazole-trimethoprim , and Amoxicillin    Review of Systems Review of Systems  HENT:  Positive for nosebleeds.      Physical Exam Triage Vital Signs ED Triage Vitals  Encounter Vitals Group     BP 11/26/24 0908 (!) 159/100     Girls Systolic BP Percentile --      Girls Diastolic BP Percentile  --      Boys Systolic BP Percentile --      Boys Diastolic BP Percentile --      Pulse Rate 11/26/24 0908 (!) 102     Resp 11/26/24 0908 18     Temp 11/26/24 0908 98.9 F (37.2 C)     Temp Source 11/26/24 0908 Oral     SpO2 11/26/24 0908 96 %     Weight --      Height --      Head Circumference --      Peak Flow --      Pain Score 11/26/24 0905 0     Pain Loc --      Pain Education --      Exclude from Growth Chart --    No data found.  Updated Vital Signs BP (!) 159/100 (BP Location: Left Arm)   Pulse (!) 102   Temp 98.9 F (37.2 C) (Oral)   Resp 18   SpO2 96%   Visual Acuity Right Eye Distance:   Left Eye Distance:   Bilateral Distance:    Right Eye Near:   Left Eye Near:    Bilateral Near:     Physical Exam Vitals and nursing note reviewed.  Constitutional:      General: She is not in acute distress.    Appearance: Normal appearance. She is not ill-appearing, toxic-appearing or diaphoretic.  HENT:     Nose:     Right Nostril: No epistaxis.     Left Nostril: Epistaxis present.  Eyes:     General: No scleral icterus. Cardiovascular:     Rate and Rhythm: Normal rate and regular rhythm.     Heart sounds: Normal heart sounds.  Pulmonary:     Effort: Pulmonary effort is normal. No respiratory distress.     Breath sounds: Normal breath sounds. No wheezing or rhonchi.  Skin:    General: Skin is warm.  Neurological:     Mental Status: She is alert and oriented to person, place, and time.  Psychiatric:        Mood and Affect: Mood normal.        Behavior: Behavior normal.      UC Treatments / Results  Labs (all labs ordered are listed, but only abnormal results are displayed) Labs Reviewed - No data to display  EKG   Radiology No results found.  Procedures Procedures (including critical care time)  Medications Ordered in UC Medications - No data to display  Initial Impression / Assessment and Plan / UC Course  I have reviewed the triage  vital signs and the nursing notes.  Pertinent labs & imaging results that were available during  my care of the patient were reviewed by me and considered in my medical decision making (see chart for details).    Final Clinical Impressions(s) / UC Diagnoses   Final diagnoses:  None   Discharge Instructions   None    ED Prescriptions   None    PDMP not reviewed this encounter.    [1]  Social History Tobacco Use   Smoking status: Never    Passive exposure: Never   Smokeless tobacco: Never  Vaping Use   Vaping status: Never Used  Substance Use Topics   Alcohol use: Yes    Comment: rare   Drug use: No     Andra Corean BROCKS, PA-C 11/26/24 [id number]

## 2024-11-26 NOTE — ED Triage Notes (Signed)
 Pt c/o nosebleed x2hrs, advises this is 3rd time- yesterday, this AM & now, but it won't stop this time. Denies thinners, lightheaded/ dizzy feeling, SHOB.

## 2024-11-26 NOTE — ED Triage Notes (Signed)
 States she had a nosebleed yesterday. EMS came to the house and helped her stop it. This morning she blew her nose and started bleeding again

## 2024-11-27 DIAGNOSIS — R040 Epistaxis: Secondary | ICD-10-CM

## 2024-11-27 MED ORDER — OXYMETAZOLINE HCL 0.05 % NA SOLN
1.0000 | Freq: Once | NASAL | Status: AC
  Administered 2024-11-27: 1 via NASAL
  Filled 2024-11-27: qty 30

## 2024-11-27 NOTE — ED Provider Notes (Signed)
 " Joyce Bennett EMERGENCY DEPARTMENT AT [facility] Provider Note  CSN: [id number] Arrival date & time: 11/26/24 2243  Chief Complaint(s) Epistaxis   HPI & MDM Joyce Bennett is a 68 y.o. female here for   Epistaxis Location:  L nare Severity:  Moderate Duration:  2 days (1 hour + this evening) Timing:  Intermittent Progression:  Waxing and waning Chronicity:  New Context: weather change   Context: not anticoagulants, not aspirin use, not bleeding disorder, not CPAP, not drug use, not foreign body, not home oxygen, not recent infection, not thrombocytopenia and not trauma   Relieved by:  Vasoconstrictors and applying pressure Associated symptoms: congestion   Associated symptoms: no cough, no dizziness, no facial pain, no fever, no headaches and no sore throat    Initial nosebleed yesterday was able to be controlled by EMS.   the following morning, she blew her nose and had another nosebleed.  She went to urgent care who was able to control the bleed with Afrin.  This evening had another episode of epistaxis.  Unable to control it with pressure or Afrin.  MDM  Exam is consistent with left anterior epistaxis.  She is hypertensive likely due to the Afrin use. No significant bleeding and only mild oozing currently. Controlled with makeshift nasal tampon soaked with Afrin.  Patient's blood pressure and tachycardia improved without intervention. She was monitored for several hours.  Ambulated without complication.  No need for labs at this time.    Medical Decision Making Risk OTC drugs.    Final Clinical Impression(s) / ED Diagnoses Final diagnoses:  Acute anterior epistaxis   The patient appears reasonably screened and/or stabilized for discharge and I doubt any other medical condition or other [facility] requiring further screening, evaluation, or treatment in the ED at this time. I have discussed the findings, Dx and Tx plan with the patient/family who expressed  understanding and agree(s) with the plan. Discharge instructions discussed at length. The patient/family was given strict return precautions who verbalized understanding of the instructions. No further questions at time of discharge.  Disposition: Discharge  Condition: Good  ED Discharge Orders     None         Follow Up: Gladystine Erminio CROME, MD [address] [place] [address] [phone]  Call       Past Medical History Past Medical History:  Diagnosis Date   Breast cancer [facility])    Family history of breast cancer 08/10/2021   Family history of prostate cancer 08/10/2021   History of radiation therapy    left breast - 10/16/2021 - 11/17/2021 - Dr Lynwood Nasuti   Multiple sclerosis    Neurogenic bladder    Optic neuritis, left    Personal history of radiation therapy    Restless leg syndrome    Urinary tract infection    Recurrent   Patient Active Problem List   Diagnosis Date Noted   Genetic testing 08/24/2021   Family history of breast cancer 08/10/2021   Family history of prostate cancer 08/10/2021   Malignant neoplasm of lower-outer quadrant of left breast of female, estrogen receptor positive (HCC) 08/03/2021   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 Authorizing Provider  azelastine (OPTIVAR) 0.05 % ophthalmic solution Place 1 drop into both eyes 2 (two) times daily. 11/11/24   [provider]  CALCIUM PO  Take by mouth daily.    [provider]  estradiol (ESTRACE) 0.01 % CREA vaginal cream Place 1 Applicatorful vaginally.    [provider]  exemestane  (AROMASIN ) 25 MG tablet Take 1 tablet (25 mg total) by mouth daily after breakfast. 11/04/24   Odean Potts, MD  fluticasone  (FLONASE ) 50 MCG/ACT nasal spray Place 2 sprays into both nostrils daily. 07/27/22    Palumbo, April, MD  Interferon Beta-1b  (BETASERON ) 0.3 MG KIT injection ADD 1.2 ML DILUENT TO VIAL AND MIX GENTLY. INJECT 1 ML UNDER THE SKIN EVERY OTHER DAY 10/19/24   Millikan, Megan, NP  Multiple Vitamin (MULTIVITAMIN) tablet Take 1 tablet by mouth daily.    [provider]  Multiple Vitamins-Minerals (ZINC PO) Take 1 tablet by mouth daily.    [provider]  olopatadine (PATANOL) 0.1 % ophthalmic solution Place 1 drop into both eyes 2 (two) times daily.    [provider]  trimethoprim  (TRIMPEX ) 100 MG tablet Take 100 mg by mouth at bedtime. 11/10/24   [provider]                                                                                                                                    Allergies Keflex [cephalexin], Shellfish allergy, Sulfamethoxazole-trimethoprim , and Amoxicillin   Review of Systems Review of Systems  Constitutional:  Negative for fever.  HENT:  Positive for congestion and nosebleeds. Negative for sore throat.   Respiratory:  Negative for cough.   Neurological:  Negative for dizziness and headaches.   As noted in HPI  Physical Exam Vital Signs  I have reviewed the triage vital signs BP (!) 168/89 (BP Location: Right Arm)   Pulse (!) 115   Temp 97.9 F (36.6 C) (Oral)   Resp 20   SpO2 97%   Physical Exam Vitals reviewed.  Constitutional:      General: She is not in acute distress.    Appearance: She is well-developed. She is not diaphoretic.  HENT:     Head: Normocephalic and atraumatic.     Right Ear: External ear normal.     Left Ear: External ear normal.     Nose: No signs of injury or laceration.     Right Nostril: No epistaxis or septal hematoma.     Left Nostril: Epistaxis (anterior) present. No septal hematoma.     Mouth/Throat:     Pharynx: Postnasal drip (Scant amount of blood) present.  Eyes:     General: No scleral icterus.    Conjunctiva/sclera: Conjunctivae normal.  Neck:     Trachea:  Phonation normal.  Cardiovascular:     Rate and Rhythm: Normal rate and regular rhythm.  Pulmonary:     Effort: Pulmonary effort is normal. No respiratory distress.     Breath sounds: No stridor.  Abdominal:     General: There is no distension.  Musculoskeletal:        General:  Normal range of motion.     Cervical back: Normal range of motion.  Neurological:     Mental Status: She is alert and oriented to person, place, and time.  Psychiatric:        Behavior: Behavior normal.     ED Results and Treatments Labs (all labs ordered are listed, but only abnormal results are displayed) Labs Reviewed - No data to display                                                                                                                       EKG  EKG Interpretation Date/Time:    Ventricular Rate:    PR Interval:    QRS Duration:    QT Interval:    QTC Calculation:   R Axis:      Text Interpretation:         Radiology No results found.  Medications Ordered in ED Medications  oxymetazoline (AFRIN) 0.05 % nasal spray 1 spray (1 spray Each Nare Given 11/27/24 0121)   Procedures Procedures  (including critical care time)   This chart was dictated using voice recognition software.  Despite best efforts to proofread,  errors can occur which can change the documentation meaning.   Trine Raynell Moder, MD 11/27/24 [phone]  "

## 2024-11-27 NOTE — ED Notes (Signed)
 Affrin soaked tissue to left nare as advised by MD. Pt ambulatory in hall to restroom, gait steady. Family at bedside.

## 2024-11-27 NOTE — ED Notes (Signed)
 Ambulatory in hall with pulse ox, 97% HR from 101-115 BPM. Denies dizziness, gait steady.

## 2024-11-27 NOTE — ED Notes (Signed)
 Chief Complaint  Patient presents with   Epistaxis   Past Surgical History:  Procedure Laterality Date   BREAST LUMPECTOMY Left    08-2021   BREAST LUMPECTOMY WITH RADIOACTIVE SEED AND SENTINEL LYMPH NODE BIOPSY Left 09/01/2021   Procedure: LEFT BREAST LUMPECTOMY WITH RADIOACTIVE SEED AND SENTINEL LYMPH NODE BIOPSY;  Surgeon: Curvin Deward MOULD, MD;  Location: [facility];  Service: General;  Laterality: Left;   FOOT SURGERY N/A    HAND SURGERY Right

## 2024-11-30 ENCOUNTER — Ambulatory Visit: Payer: Self-pay

## 2024-12-22 ENCOUNTER — Telehealth: Payer: Self-pay | Admitting: Adult Health

## 2024-12-22 ENCOUNTER — Ambulatory Visit: Admitting: Adult Health

## 2024-12-22 ENCOUNTER — Encounter: Payer: Self-pay | Admitting: Adult Health

## 2024-12-22 VITALS — BP 114/74 | HR 100 | Ht 63.0 in | Wt 169.8 lb

## 2024-12-22 DIAGNOSIS — G35A Relapsing-remitting multiple sclerosis: Secondary | ICD-10-CM

## 2024-12-22 NOTE — Telephone Encounter (Signed)
 Sent to [facility] to schedule. [phone]

## 2024-12-22 NOTE — Patient Instructions (Signed)
 Your Plan:  Continue Betaseron  Blood work today MRI brain      Thank you for coming to see us  at [facility]. I hope we have been able to provide you high quality care today.  You may receive a patient satisfaction survey over the next few weeks. We would appreciate your feedback and comments so that we may continue to improve ourselves and the health of our patients.

## 2024-12-22 NOTE — Progress Notes (Signed)
 "   PATIENT: Joyce Bennett DOB: 08-08-1956  REASON FOR VISIT: follow up HISTORY FROM: patient  Chief Complaint  Patient presents with   Follow-up    Pt in 4 alone Pt here for MS f/u Pt states fatigue      HISTORY OF PRESENT ILLNESS: Today 12/22/2024:  Joyce Bennett is a 69 y.o. female with a history of multiple sclerosis. Returns today for follow-up.  She remains on Betaseron .  She denies any additional symptoms.  No new numbness or weakness.  No change in gait or balance.  No change in bowels or bladder.  Denies any changes with her vision.  Her last MRI of the brain was done in 2023.  She does report fatigue but that has been ongoing. Returns today for follow-up.    04/22/24 Joyce Bennett is a 69 y.o. female with a history of multiple sclerosis. Returns today for follow-up.  She saw Dr. Onita at the last visit.  She was willing to switch to another medication as she is tired of injecting herself.  However she never started Aubagio due to the side effect of hair loss.  She has remained on Betaseron .  She states that she has done 2 injections in the abdomen that got infected.  She is on medication for those.  She is now injecting in different areas with no additional infections.  She reports that her symptoms are stable.  No new numbness or weakness.  No change in bowels or bladder.  She reports that her balance is a little off.  She had a fall 3 to 4 months ago walking out of [facility].  She states that she had an abrasion above the left eyebrow and cracked her tooth.  She did go to the emergency room and had CT scan of the face which was unremarkable.  She returns today for an evaluation.  08/22/23: Joyce Bennett is a 69 year old female, follow-up for relapsing remitting multiple sclerosis, she was patient of Dr. Jenel in the past, followed by nurse practitioner Duwaine for many years, her primary care physician is from [place], Erminio CROME   I reviewed and summarized the  referring note. PMHX Left breast cancer, s/p lobectomy, radiation therapy. RRMS   She just retired from driving schoolbus for 17 years, functioning very well, no gait abnormality,   Relapsing remitting multiple sclerosis was diagnosed in her early 98s, presenting with 1 vision loss, recovered with IV steroid, diagnosis was confirmed by abnormal MRI of the brain, personally reviewed most recent MRI of the brain with and without contrast from August 2023, compared to multiple previous scans, periventricular T2/FLAIR hyperdensity, oval-shaped, consistent with her diagnosis of multiple sclerosis, lesion burden overall has no significant change   At the beginning of the disease, she has some lower extremity paresthesia, but never had gait abnormality, had a frequent urinary tract infection, due to bladder spasticity, receiving intermittent Trimpex  treatment from urology,   She denies other significant relapsing remitting episodes, no MRI spine information found in system   She was started on Betaseron  around 2000,  Because she has been doing so well, so stable, tired of needle injection, subcutaneous lipoatrophy, wants to consider stop the medications,   11/06/22: Joyce Bennett is a 69 year old female with a history of multiple sclerosis.  She returns today for follow-up.  Overall she feels that her symptoms have remained stable.  No changes in her gait or balance.  No changes with vision.  No changes with  bowels or bladder.  No new numbness or weakness.  Remains on Betaseron .  Did repeat MRI of the brain in August with results following.  Also had blood work completed in August which I reviewed in epic  MRI BRAIN W/O contrast 07/28/2022: IMPRESSION: 1. No acute intracranial pathology. 2. Periventricular FLAIR hyperintensity consistent with multiple sclerosis. The burden of disease is overall not significantly changed since 2020, with no new or enhancing lesions to suggest active  demyelination.  04/23/22: Joyce Bennett is a 69 year old female with a history of Multiple sclerosis. She returns today for follow-up. Reports that symptoms have been stable. Remains on Betaseron . Didn't repeat MRI as ordered at the last visit. Denies any changes with gait or balance. No changes with vision. No change with bowels or bladder. Returns today for follow-up.  10/27/21: Joyce Bennett is a 69 year old female with a history of multiple sclerosis.  She returns today for follow-up.  Overall she feels that she is doing well.  Denies any changes with her gait or balance.  Denies any new numbness or weakness.  No changes with her vision.  No changes with the bowels or bladder.  She continues on Betaseron .  She states that she is getting radiation for stage I breast cancer.   She returns today for an evaluation.  04/16/21: Joyce Bennett is a 69 year old female with a history of multiple sclerosis.  She returns today for follow-up.  Overall she feels that she has been doing well.  She denies any new symptoms.  No new numbness or tingling.  No changes with her gait or balance.  No changes with the bowels or bladder.  No visual changes.  She remains on Betaseron .  She returns today for an evaluation.  HISTORY 06/18/19:   Joyce Bennett is a 69 year old female with a history of multiple sclerosis.  She returns today for follow-up.  She remains on Betaseron .  She denies any new symptoms.  No changes with her gait or balance.  No changes with her bowels or bladder.  No new numbness or weakness.  No changes with her vision.  Her last MRI of the brain was in 2017.  She joins me today for follow-up.  REVIEW OF SYSTEMS: Out of a complete 14 system review of symptoms, the patient complains only of the following symptoms, and all other reviewed systems are negative.  See HPI  ALLERGIES: Allergies  Allergen Reactions   Keflex [Cephalexin]    Shellfish Allergy Swelling    Pt. Still consumes shellfish and  products containing shelfish   Sulfamethoxazole-Trimethoprim  Itching   Amoxicillin  Dermatitis    HOME MEDICATIONS: Outpatient Medications Prior to Visit  Medication Sig Dispense Refill   azelastine (OPTIVAR) 0.05 % ophthalmic solution Place 1 drop into both eyes 2 (two) times daily.     CALCIUM PO Take by mouth daily.     estradiol (ESTRACE) 0.01 % CREA vaginal cream Place 1 Applicatorful vaginally.     exemestane  (AROMASIN ) 25 MG tablet Take 1 tablet (25 mg total) by mouth daily after breakfast. 90 tablet 3   fluticasone  (FLONASE ) 50 MCG/ACT nasal spray Place 2 sprays into both nostrils daily. 16 mL 0   Interferon Beta-1b  (BETASERON ) 0.3 MG KIT injection ADD 1.2 ML DILUENT TO VIAL AND MIX GENTLY. INJECT 1 ML UNDER THE SKIN EVERY OTHER DAY 42 kit 3   Multiple Vitamin (MULTIVITAMIN) tablet Take 1 tablet by mouth daily.     Multiple Vitamins-Minerals (ZINC PO) Take 1 tablet by mouth daily.  olopatadine (PATANOL) 0.1 % ophthalmic solution Place 1 drop into both eyes 2 (two) times daily.     trimethoprim  (TRIMPEX ) 100 MG tablet Take 100 mg by mouth at bedtime.     Facility-Administered Medications Prior to Visit  Medication Dose Route Frequency Provider Last Rate Last Admin   gadopentetate dimeglumine  (MAGNEVIST ) injection 15 mL  15 mL Intravenous Once PRN Jenel Carlin POUR, MD        PAST MEDICAL HISTORY: Past Medical History:  Diagnosis Date   Breast cancer [facility])    Family history of breast cancer 08/10/2021   Family history of prostate cancer 08/10/2021   History of radiation therapy    left breast - 10/16/2021 - 11/17/2021 - Dr Lynwood Nasuti   Multiple sclerosis    Neurogenic bladder    Optic neuritis, left    Personal history of radiation therapy    Restless leg syndrome    Urinary tract infection    Recurrent    PAST SURGICAL HISTORY: Past Surgical History:  Procedure Laterality Date   BREAST LUMPECTOMY Left    08-2021   BREAST LUMPECTOMY WITH RADIOACTIVE SEED AND  SENTINEL LYMPH NODE BIOPSY Left 09/01/2021   Procedure: LEFT BREAST LUMPECTOMY WITH RADIOACTIVE SEED AND SENTINEL LYMPH NODE BIOPSY;  Surgeon: Curvin Deward MOULD, MD;  Location: [facility];  Service: General;  Laterality: Left;   FOOT SURGERY N/A    HAND SURGERY Right     FAMILY HISTORY: Family History  Problem Relation Age of Onset   Stomach cancer Sister        dx 79s   Breast cancer Maternal Aunt        dx after 37, x2 maternal aunts   Prostate cancer Maternal Uncle        dx 60s   Heart disease Maternal Grandmother    Breast cancer Paternal Grandmother        dx after 50   Multiple sclerosis Neg Hx     SOCIAL HISTORY: Social History   Socioeconomic History   Marital status: Married    Spouse name: Not on file   Number of children: 1   Years of education: ASSOCIATE   Highest education level: Not on file  Occupational History   Occupation: Facilities Manager: [employer]  Tobacco Use   Smoking status: Never    Passive exposure: Never   Smokeless tobacco: Never  Vaping Use   Vaping status: Never Used  Substance and Sexual Activity   Alcohol use: Yes    Comment: rare   Drug use: No   Sexual activity: Not on file  Other Topics Concern   Not on file  Social History Narrative   Patient is married with 1 child.Patient is right handed.Patient has a Associate's degree.  4 cups a Week of Tea      Pt lives alone    Retired    Social Drivers of Health   Tobacco Use: Low Risk (12/22/2024)   Patient History    Smoking Tobacco Use: Never    Smokeless Tobacco Use: Never    Passive Exposure: Never  Financial Resource Strain: Low Risk (09/14/2024)   Received from [facility]   Overall Financial Resource Strain (CARDIA)    How hard is it for you to pay for the very basics like food, housing, medical care, and heating?: Not hard at all  Food Insecurity: No Food Insecurity (09/14/2024)   Received from [facility]  Within the past 12  months, you worried that your food would run out before you got the money to buy more.: Never true    Within the past 12 months, the food you bought just didn't last and you didn't have money to get more.: Never true  Transportation Needs: No Transportation Needs (09/14/2024)   Received from [facility]    In the past 12 months, has lack of transportation kept you from medical appointments or from getting medications?: No    In the past 12 months, has lack of transportation kept you from meetings, work, or from getting things needed for daily living?: No  Physical Activity: Sufficiently Active (09/14/2024)   Received from [facility]   Exercise Vital Sign    On average, how many days per week do you engage in moderate to strenuous exercise (like a brisk walk)?: 4 days    On average, how many minutes do you engage in exercise at this level?: 40 min  Stress: No Stress Concern Present (09/14/2024)   Received from [facility] of Occupational Health - Occupational Stress Questionnaire    Do you feel stress - tense, restless, nervous, or anxious, or unable to sleep at night because your mind is troubled all the time - these days?: Not at all  Social Connections: Socially Integrated (04/27/2024)   Received from [facility]   Social Network    How would you rate your social network (family, work, friends)?: Good participation with social networks  Intimate Partner Violence: Not At Risk (09/14/2024)   Received from [facility]   HITS    Over the last 12 months how often did your partner physically hurt you?: Never    Over the last 12 months how often did your partner insult you or talk down to you?: Rarely    Over the last 12 months how often did your partner threaten you with physical harm?: Never    Over the last 12 months how often did your partner scream or curse at you?: Rarely  Depression (PHQ2-9): Not on file  Alcohol Screen: Not on file  Housing: Low Risk  (09/14/2024)   Received from [facility]    In the last 12 months, was there a time when you were not able to pay the mortgage or rent on time?: No    In the past 12 months, how many times have you moved where you were living?: 0    At any time in the past 12 months, were you homeless or living in a shelter (including now)?: No  Utilities: Not At Risk (09/14/2024)   Received from [facility]    In the past 12 months has the electric, gas, oil, or water company threatened to shut off services in your home?: No  Health Literacy: Not on file      PHYSICAL EXAM  Vitals:   12/22/24 1033  BP: 114/74  Pulse: 100  Weight: 169 lb 12.8 oz (77 kg)  Height: 5' 3 (1.6 m)      Body mass index is 30.08 kg/m.  Generalized: Well developed, in no acute distress   Neurological examination  Mentation: Alert oriented to time, place, history taking. Follows all commands speech and language fluent Cranial nerve II-XII: Pupils were equal round reactive to light. Extraocular movements were full, visual field were full on confrontational test. Facial sensation and strength were normal. Head turning and shoulder shrug  were normal and symmetric. Motor: The motor testing reveals 5 over 5 strength of all 4 extremities. Good symmetric motor tone is noted throughout.  Sensory: Sensory testing is intact to soft touch on all 4 extremities. No evidence of extinction is noted.  Coordination: Cerebellar testing reveals good finger-nose-finger and heel-to-shin bilaterally.  Gait and station: Gait is normal.    DIAGNOSTIC DATA (LABS, IMAGING, TESTING) - I reviewed patient records, labs, notes, testing and imaging myself where available.  Lab Results  Component Value Date   WBC 4.2 08/27/2023   HGB 11.8 08/27/2023   HCT 37.3 08/27/2023   MCV 89 08/27/2023   PLT 361 08/27/2023      Component Value Date/Time   NA 144 08/27/2023 1048   K 4.4 08/27/2023 1048   CL 104 08/27/2023 1048    CO2 26 08/27/2023 1048   GLUCOSE 86 08/27/2023 1048   GLUCOSE 98 07/28/2022 0129   BUN 15 08/27/2023 1048   CREATININE 1.13 (H) 08/27/2023 1048   CREATININE 0.84 08/09/2021 1243   CALCIUM 9.7 08/27/2023 1048   PROT 7.3 08/27/2023 1048   ALBUMIN 4.3 08/27/2023 1048   AST 23 08/27/2023 1048   AST 24 08/09/2021 1243   ALT 14 08/27/2023 1048   ALT 13 08/09/2021 1243   ALKPHOS 183 (H) 08/27/2023 1048   BILITOT 0.3 08/27/2023 1048   BILITOT 0.3 08/09/2021 1243   GFRNONAA 52 (L) 07/28/2022 0129   GFRNONAA >60 08/09/2021 1243   GFRAA 96 06/22/2020 1319      ASSESSMENT AND PLAN 69 y.o. year old female  has a past medical history of Breast cancer (HCC), Family history of breast cancer (08/10/2021), Family history of prostate cancer (08/10/2021), History of radiation therapy, Multiple sclerosis, Neurogenic bladder, Optic neuritis, left, Personal history of radiation therapy, Restless leg syndrome, and Urinary tract infection. here with:  Multiple sclerosis-relapsed remitting  Stable Continue Betaseron  MRI of brain with and without contrast Blood work, CBC, CMP Advised if symptoms worsen or she develops new symptoms she should let us  know   Follow-up in 6-9 months or sooner if needed    Duwaine Russell, MSN, NP-C 12/22/2024, 10:35 AM [facility] [address], [address] [place], [place] [address] [phone]  "

## 2024-12-23 ENCOUNTER — Ambulatory Visit: Payer: Self-pay | Admitting: Adult Health

## 2024-12-23 LAB — CBC WITH DIFFERENTIAL/PLATELET
Basophils Absolute: 0 x10E3/uL (ref 0.0–0.2)
Basos: 1 %
EOS (ABSOLUTE): 0.1 x10E3/uL (ref 0.0–0.4)
Eos: 3 %
Hematocrit: 36.4 % (ref 34.0–46.6)
Hemoglobin: 11.6 g/dL (ref 11.1–15.9)
Immature Grans (Abs): 0 x10E3/uL (ref 0.0–0.1)
Immature Granulocytes: 0 %
Lymphocytes Absolute: 1.4 x10E3/uL (ref 0.7–3.1)
Lymphs: 32 %
MCH: 28.8 pg (ref 26.6–33.0)
MCHC: 31.9 g/dL (ref 31.5–35.7)
MCV: 90 fL (ref 79–97)
Monocytes Absolute: 0.7 x10E3/uL (ref 0.1–0.9)
Monocytes: 16 %
Neutrophils Absolute: 2.1 x10E3/uL (ref 1.4–7.0)
Neutrophils: 48 %
Platelets: 378 x10E3/uL (ref 150–450)
RBC: 4.03 x10E6/uL (ref 3.77–5.28)
RDW: 12.7 % (ref 11.7–15.4)
WBC: 4.4 x10E3/uL (ref 3.4–10.8)

## 2024-12-23 LAB — COMPREHENSIVE METABOLIC PANEL WITH GFR
ALT: 8 IU/L (ref 0–32)
AST: 21 IU/L (ref 0–40)
Albumin: 4.5 g/dL (ref 3.9–4.9)
Alkaline Phosphatase: 140 IU/L — ABNORMAL HIGH (ref 49–135)
BUN/Creatinine Ratio: 12 (ref 12–28)
BUN: 15 mg/dL (ref 8–27)
Bilirubin Total: 0.4 mg/dL (ref 0.0–1.2)
CO2: 24 mmol/L (ref 20–29)
Calcium: 10 mg/dL (ref 8.7–10.3)
Chloride: 102 mmol/L (ref 96–106)
Creatinine, Ser: 1.25 mg/dL — ABNORMAL HIGH (ref 0.57–1.00)
Globulin, Total: 2.7 g/dL (ref 1.5–4.5)
Glucose: 73 mg/dL (ref 70–99)
Potassium: 4.7 mmol/L (ref 3.5–5.2)
Sodium: 141 mmol/L (ref 134–144)
Total Protein: 7.2 g/dL (ref 6.0–8.5)
eGFR: 47 mL/min/1.73 — ABNORMAL LOW

## 2024-12-23 NOTE — Telephone Encounter (Signed)
-----   Message from Duwaine Russell, NP sent at 12/23/2024  9:48 AM EST ----- Please send blood work to PCP for review

## 2024-12-23 NOTE — Telephone Encounter (Signed)
 Sent PCP labwork results this am

## 2024-12-29 ENCOUNTER — Encounter: Payer: Self-pay | Admitting: Adult Health

## 2025-01-06 ENCOUNTER — Inpatient Hospital Stay: Admission: RE | Admit: 2025-01-06 | Discharge: 2025-01-06 | Attending: Adult Health

## 2025-01-06 DIAGNOSIS — G35A Relapsing-remitting multiple sclerosis: Secondary | ICD-10-CM

## 2025-01-06 MED ORDER — GADOPICLENOL 0.5 MMOL/ML IV SOLN
8.0000 mL | Freq: Once | INTRAVENOUS | Status: AC | PRN
  Administered 2025-01-06: 8 mL via INTRAVENOUS

## 2025-01-10 ENCOUNTER — Other Ambulatory Visit

## 2025-01-14 NOTE — Telephone Encounter (Signed)
 Chart reviewed, patient has been clinical and imaging wise stable for her multiple sclerosis  Has not had clinical flareup for many years  It is okay to stop Betaseron  injection, have repeat MRI brain with without contrast in 1 year and follow-up after MRI  She is to call clinic for any focal neurological symptoms

## 2025-01-18 ENCOUNTER — Ambulatory Visit: Attending: General Surgery

## 2025-10-27 ENCOUNTER — Inpatient Hospital Stay: Admitting: Hematology and Oncology

## 2025-12-01 ENCOUNTER — Ambulatory Visit: Admitting: Adult Health
